# Patient Record
Sex: Female | Born: 1985 | Race: Black or African American | Hispanic: No | Marital: Single | Smoking: Current every day smoker
Health system: Southern US, Community
[De-identification: ages and names within clinical notes are randomized; demographics above are authoritative.]

## PROBLEM LIST (undated history)

## (undated) ENCOUNTER — Inpatient Hospital Stay (HOSPITAL_COMMUNITY): Payer: Self-pay

## (undated) DIAGNOSIS — L309 Dermatitis, unspecified: Secondary | ICD-10-CM

## (undated) DIAGNOSIS — A630 Anogenital (venereal) warts: Secondary | ICD-10-CM

## (undated) DIAGNOSIS — R000 Tachycardia, unspecified: Secondary | ICD-10-CM

## (undated) DIAGNOSIS — N390 Urinary tract infection, site not specified: Secondary | ICD-10-CM

## (undated) DIAGNOSIS — IMO0002 Reserved for concepts with insufficient information to code with codable children: Secondary | ICD-10-CM

## (undated) DIAGNOSIS — F419 Anxiety disorder, unspecified: Secondary | ICD-10-CM

## (undated) DIAGNOSIS — A609 Anogenital herpesviral infection, unspecified: Secondary | ICD-10-CM

## (undated) DIAGNOSIS — F329 Major depressive disorder, single episode, unspecified: Secondary | ICD-10-CM

## (undated) DIAGNOSIS — F32A Depression, unspecified: Secondary | ICD-10-CM

## (undated) HISTORY — PX: WISDOM TOOTH EXTRACTION: SHX21

---
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
## 2003-12-15 ENCOUNTER — Emergency Department (HOSPITAL_COMMUNITY): Admission: EM | Admit: 2003-12-15 | Discharge: 2003-12-15 | Payer: Self-pay | Admitting: Emergency Medicine

## 2004-09-11 ENCOUNTER — Ambulatory Visit: Payer: Self-pay | Admitting: Nurse Practitioner

## 2004-11-05 ENCOUNTER — Emergency Department (HOSPITAL_COMMUNITY): Admission: EM | Admit: 2004-11-05 | Discharge: 2004-11-05 | Payer: Self-pay | Admitting: Emergency Medicine

## 2004-12-05 ENCOUNTER — Ambulatory Visit: Payer: Self-pay | Admitting: Nurse Practitioner

## 2005-02-04 ENCOUNTER — Inpatient Hospital Stay (HOSPITAL_COMMUNITY): Admission: AD | Admit: 2005-02-04 | Discharge: 2005-02-05 | Payer: Self-pay | Admitting: *Deleted

## 2005-02-26 ENCOUNTER — Ambulatory Visit (HOSPITAL_COMMUNITY): Admission: RE | Admit: 2005-02-26 | Discharge: 2005-02-26 | Payer: Self-pay | Admitting: *Deleted

## 2005-04-13 ENCOUNTER — Inpatient Hospital Stay (HOSPITAL_COMMUNITY): Admission: AD | Admit: 2005-04-13 | Discharge: 2005-04-13 | Payer: Self-pay | Admitting: *Deleted

## 2005-04-13 ENCOUNTER — Ambulatory Visit: Payer: Self-pay | Admitting: Family Medicine

## 2005-04-30 ENCOUNTER — Inpatient Hospital Stay (HOSPITAL_COMMUNITY): Admission: AD | Admit: 2005-04-30 | Discharge: 2005-04-30 | Payer: Self-pay | Admitting: Obstetrics and Gynecology

## 2005-04-30 ENCOUNTER — Ambulatory Visit: Payer: Self-pay | Admitting: Family Medicine

## 2005-07-01 ENCOUNTER — Ambulatory Visit: Payer: Self-pay | Admitting: *Deleted

## 2005-07-01 ENCOUNTER — Inpatient Hospital Stay (HOSPITAL_COMMUNITY): Admission: AD | Admit: 2005-07-01 | Discharge: 2005-07-03 | Payer: Self-pay | Admitting: Obstetrics and Gynecology

## 2005-10-23 ENCOUNTER — Emergency Department (HOSPITAL_COMMUNITY): Admission: EM | Admit: 2005-10-23 | Discharge: 2005-10-23 | Payer: Self-pay | Admitting: Emergency Medicine

## 2005-11-30 ENCOUNTER — Emergency Department (HOSPITAL_COMMUNITY): Admission: EM | Admit: 2005-11-30 | Discharge: 2005-11-30 | Payer: Self-pay | Admitting: Emergency Medicine

## 2006-07-16 ENCOUNTER — Emergency Department (HOSPITAL_COMMUNITY): Admission: EM | Admit: 2006-07-16 | Discharge: 2006-07-16 | Payer: Self-pay | Admitting: Emergency Medicine

## 2006-07-19 ENCOUNTER — Emergency Department (HOSPITAL_COMMUNITY): Admission: EM | Admit: 2006-07-19 | Discharge: 2006-07-19 | Payer: Self-pay | Admitting: Emergency Medicine

## 2006-07-28 ENCOUNTER — Encounter: Admission: RE | Admit: 2006-07-28 | Discharge: 2006-09-15 | Payer: Self-pay | Admitting: Geriatric Medicine

## 2007-01-03 ENCOUNTER — Emergency Department (HOSPITAL_COMMUNITY): Admission: EM | Admit: 2007-01-03 | Discharge: 2007-01-03 | Payer: Self-pay | Admitting: Family Medicine

## 2007-01-30 ENCOUNTER — Emergency Department (HOSPITAL_COMMUNITY): Admission: EM | Admit: 2007-01-30 | Discharge: 2007-01-30 | Payer: Self-pay | Admitting: Family Medicine

## 2007-04-02 ENCOUNTER — Emergency Department (HOSPITAL_COMMUNITY): Admission: EM | Admit: 2007-04-02 | Discharge: 2007-04-02 | Payer: Self-pay | Admitting: Family Medicine

## 2007-10-28 ENCOUNTER — Emergency Department (HOSPITAL_COMMUNITY): Admission: EM | Admit: 2007-10-28 | Discharge: 2007-10-28 | Payer: Self-pay | Admitting: Family Medicine

## 2008-01-11 ENCOUNTER — Emergency Department (HOSPITAL_COMMUNITY): Admission: EM | Admit: 2008-01-11 | Discharge: 2008-01-11 | Payer: Self-pay | Admitting: Emergency Medicine

## 2008-05-06 ENCOUNTER — Emergency Department (HOSPITAL_COMMUNITY): Admission: EM | Admit: 2008-05-06 | Discharge: 2008-05-06 | Payer: Self-pay | Admitting: Emergency Medicine

## 2008-08-17 ENCOUNTER — Emergency Department (HOSPITAL_COMMUNITY): Admission: EM | Admit: 2008-08-17 | Discharge: 2008-08-17 | Payer: Self-pay | Admitting: Emergency Medicine

## 2008-11-01 ENCOUNTER — Emergency Department (HOSPITAL_COMMUNITY): Admission: EM | Admit: 2008-11-01 | Discharge: 2008-11-01 | Payer: Self-pay | Admitting: Family Medicine

## 2008-11-04 ENCOUNTER — Emergency Department (HOSPITAL_COMMUNITY): Admission: EM | Admit: 2008-11-04 | Discharge: 2008-11-04 | Payer: Self-pay | Admitting: Family Medicine

## 2009-06-27 ENCOUNTER — Emergency Department (HOSPITAL_COMMUNITY): Admission: EM | Admit: 2009-06-27 | Discharge: 2009-06-27 | Payer: Self-pay | Admitting: Emergency Medicine

## 2009-07-17 ENCOUNTER — Emergency Department (HOSPITAL_COMMUNITY): Admission: EM | Admit: 2009-07-17 | Discharge: 2009-07-17 | Payer: Self-pay | Admitting: Family Medicine

## 2009-10-10 ENCOUNTER — Emergency Department (HOSPITAL_COMMUNITY)
Admission: EM | Admit: 2009-10-10 | Discharge: 2009-10-10 | Payer: Self-pay | Source: Home / Self Care | Admitting: Family Medicine

## 2009-12-15 ENCOUNTER — Emergency Department (HOSPITAL_COMMUNITY)
Admission: EM | Admit: 2009-12-15 | Discharge: 2009-12-15 | Payer: Self-pay | Source: Home / Self Care | Admitting: Emergency Medicine

## 2010-02-08 ENCOUNTER — Emergency Department (HOSPITAL_COMMUNITY)
Admission: EM | Admit: 2010-02-08 | Discharge: 2010-02-08 | Payer: Self-pay | Source: Home / Self Care | Admitting: Family Medicine

## 2010-02-08 LAB — WET PREP, GENITAL
Trich, Wet Prep: NONE SEEN
Yeast Wet Prep HPF POC: NONE SEEN

## 2010-02-08 LAB — POCT URINALYSIS DIPSTICK
Hemoglobin, Urine: NEGATIVE
Ketones, ur: NEGATIVE mg/dL
Nitrite: NEGATIVE
Protein, ur: 30 mg/dL — AB
Specific Gravity, Urine: >=1.03 (ref 1.005–1.030)
Urine Glucose, Fasting: 100 mg/dL — AB
Urobilinogen, UA: 0.2 mg/dL (ref 0.0–1.0)
pH: 5.5 (ref 5.0–8.0)

## 2010-02-08 LAB — POCT PREGNANCY, URINE: Preg Test, Ur: NEGATIVE

## 2010-02-18 LAB — GC/CHLAMYDIA PROBE AMP, GENITAL
Chlamydia, DNA Probe: NEGATIVE
GC Probe Amp, Genital: NEGATIVE

## 2010-03-13 ENCOUNTER — Inpatient Hospital Stay (INDEPENDENT_AMBULATORY_CARE_PROVIDER_SITE_OTHER)
Admission: RE | Admit: 2010-03-13 | Discharge: 2010-03-13 | Disposition: A | Discharge status: Home / Self Care | Payer: Medicaid Other | Source: Ambulatory Visit | Attending: Emergency Medicine | Admitting: Emergency Medicine

## 2010-03-13 DIAGNOSIS — R070 Pain in throat: Secondary | ICD-10-CM

## 2010-03-13 LAB — POCT RAPID STREP A (OFFICE): Streptococcus, Group A Screen (Direct): NEGATIVE

## 2010-04-16 ENCOUNTER — Inpatient Hospital Stay (INDEPENDENT_AMBULATORY_CARE_PROVIDER_SITE_OTHER)
Admission: RE | Admit: 2010-04-16 | Discharge: 2010-04-16 | Disposition: A | Discharge status: Home / Self Care | Payer: Medicaid Other | Source: Ambulatory Visit | Attending: Family Medicine | Admitting: Family Medicine

## 2010-04-16 DIAGNOSIS — N760 Acute vaginitis: Secondary | ICD-10-CM

## 2010-04-16 LAB — WET PREP, GENITAL
Trich, Wet Prep: NONE SEEN
Yeast Wet Prep HPF POC: NONE SEEN

## 2010-04-16 LAB — POCT URINALYSIS DIPSTICK
Glucose, UA: NEGATIVE mg/dL
Hgb urine dipstick: NEGATIVE
Ketones, ur: NEGATIVE mg/dL
Nitrite: NEGATIVE
Protein, ur: 30 mg/dL — AB
Specific Gravity, Urine: >=1.03 (ref 1.005–1.030)
Urobilinogen, UA: 0.2 mg/dL (ref 0.0–1.0)
pH: 5.5 (ref 5.0–8.0)

## 2010-04-16 LAB — POCT PREGNANCY, URINE: Preg Test, Ur: NEGATIVE

## 2010-04-17 LAB — GC/CHLAMYDIA PROBE AMP, GENITAL
Chlamydia, DNA Probe: NEGATIVE
GC Probe Amp, Genital: NEGATIVE

## 2010-04-18 LAB — POCT URINALYSIS DIPSTICK
Bilirubin Urine: NEGATIVE
Glucose, UA: NEGATIVE mg/dL
Hgb urine dipstick: NEGATIVE
Ketones, ur: NEGATIVE mg/dL
Nitrite: NEGATIVE
Protein, ur: NEGATIVE mg/dL
Specific Gravity, Urine: 1.02 (ref 1.005–1.030)
Urobilinogen, UA: 1 mg/dL (ref 0.0–1.0)
pH: 7.5 (ref 5.0–8.0)

## 2010-04-18 LAB — WET PREP, GENITAL
Trich, Wet Prep: NONE SEEN
Yeast Wet Prep HPF POC: NONE SEEN

## 2010-04-18 LAB — GC/CHLAMYDIA PROBE AMP, GENITAL
Chlamydia, DNA Probe: NEGATIVE
GC Probe Amp, Genital: NEGATIVE

## 2010-04-18 LAB — POCT PREGNANCY, URINE: Preg Test, Ur: NEGATIVE

## 2010-04-22 LAB — POCT URINALYSIS DIP (DEVICE)
Bilirubin Urine: NEGATIVE
Glucose, UA: NEGATIVE mg/dL
Hgb urine dipstick: NEGATIVE
Ketones, ur: NEGATIVE mg/dL
Nitrite: NEGATIVE
Protein, ur: 30 mg/dL — AB
Specific Gravity, Urine: >=1.03 (ref 1.005–1.030)
Urobilinogen, UA: 0.2 mg/dL (ref 0.0–1.0)
pH: 5.5 (ref 5.0–8.0)

## 2010-04-22 LAB — WET PREP, GENITAL

## 2010-04-22 LAB — GC/CHLAMYDIA PROBE AMP, GENITAL
Chlamydia, DNA Probe: NEGATIVE
GC Probe Amp, Genital: NEGATIVE

## 2010-05-09 LAB — WET PREP, GENITAL

## 2010-05-10 LAB — POCT URINALYSIS DIP (DEVICE)
Glucose, UA: NEGATIVE mg/dL
Hgb urine dipstick: NEGATIVE
Ketones, ur: NEGATIVE mg/dL
Specific Gravity, Urine: 1.025 (ref 1.005–1.030)
Urobilinogen, UA: 0.2 mg/dL (ref 0.0–1.0)

## 2010-05-10 LAB — WET PREP, GENITAL: Yeast Wet Prep HPF POC: NONE SEEN

## 2010-05-12 LAB — WET PREP, GENITAL
Clue Cells Wet Prep HPF POC: NONE SEEN
Trich, Wet Prep: NONE SEEN
Yeast Wet Prep HPF POC: NONE SEEN

## 2010-05-12 LAB — POCT URINALYSIS DIP (DEVICE)
Bilirubin Urine: NEGATIVE
Glucose, UA: NEGATIVE mg/dL
Hgb urine dipstick: NEGATIVE
Ketones, ur: NEGATIVE mg/dL
Specific Gravity, Urine: 1.02 (ref 1.005–1.030)
Urobilinogen, UA: 0.2 mg/dL (ref 0.0–1.0)

## 2010-05-12 LAB — GC/CHLAMYDIA PROBE AMP, GENITAL: Chlamydia, DNA Probe: NEGATIVE

## 2010-06-21 NOTE — Discharge Summary (Signed)
[facility]  Patient:    Alicia Pearson, Alicia Pearson Visit Number: [id number] MRN: [id number]          Service Type: OBS Location: 910A 9133 01 Attending Physician:  Tammi Sou Dictated by:   Emelda Fear, M.D. Admit Date:  02/28/2001 Discharge Date: 03/03/2001                             Discharge Summary  DATE OF BIRTH:  [birth year]  CONSULTATIONS:  None.  PROCEDURES:  Vacuum assisted vaginal delivery of twin B.  DISCHARGE DIAGNOSES: 1. Intrauterine pregnancy at 38-5/[redacted] weeks gestation. 2. Augmentation of labor via low dose Pitocin therapy. 3. Delivery of twin gestation of two viable infants. 4. Preeclampsia, status post magnesium sulfate therapy.  DISCHARGE MEDICATIONS: 1. Ibuprofen 600 mg one tablet p.o. q.6h. p.r.n. pain. 2. Prenatal vitamins one tablet p.o. q.d. x6 weeks. 3. Depo-Provera, the patient received injection prior to discharge, and will    need next injection in the beginning of May 2003.  HOSPITAL COURSE:  [name] is a 25 year old, G2, P1-0-0-1, presenting at 38-5/7 weeks with twin gestation, dychorionic, admitted for onset of labor. The patient received low dose Pitocin therapy for augmentation of labor. Artificial rupture of membranes of twin A.  Bag with clear fluid return.  The patient taken to the operating room for vaginal deliveries of twins.  Delivery of twin A via a normal spontaneous vaginal delivery.  Delivery of a female infant with Apgars of 9 at one minute and 9 at five minutes.  Following delivery of twin A, artificial rupture of membranes of twin B sac with clear fluid return.  Arm of twin B was presenting part.  Arm of twin B was reduced by Dr. Gavin Potters, and had flexed to facilitate the cephalic presentation and descent of infant.  Delivery via vacuum assisted vaginal delivery of twin B, secondary to persistent variables with fetal heart rate in the 80s.  Apgars of twin B were 9 at one minute  and 9 at five minutes.  Of note, upon presentation the patient with an elevated uric acid level of 6.7, and protein present in her urine.  The patient was initiated on magnesium sulfate therapy immediately postpartum for preeclampsia.  On postpartum day #1, the patients uric acid had increased to 7.2.  The patient was continued on magnesium sulfate, remaining asymptomatic.  Magnesium sulfate was stopped right on postpartum day #1, and the patient was observed for a subsequent 24 hours.  The patient was discharged stable with normal blood pressures and asymptomatic on postpartum day #3.  Infants will return home with mother.  LABORATORY DATA:  Hemoglobin 11.6, hematocrit 34.6.  ACTIVITY:  Unrestricted.  DIET:  Routine.  DISCHARGE INSTRUCTIONS:  Routine postpartum instructions.  DISPOSITION:  Discharged to home.  FOLLOWUP:  The patient is to follow up at [facility] in six weeks. Dictated by:   Emelda Fear, M.D. Attending Physician:  Tammi Sou DD:  03/03/01 TD:  03/03/01 Job: [id number]

## 2010-06-21 NOTE — Discharge Summary (Signed)
[facility]  Patient:    Alicia Pearson, Alicia Pearson Visit Number: [id number] MRN: [id number]          Service Type: OBS Location: 910D 9124 01 Attending Physician:  Antionette Char Dictated by:   Nicoletta Ba, M.D. Admit Date:  12/21/2000 Discharge Date: 12/28/2000                             Discharge Summary  ADMISSION DIAGNOSES:          1. Twenty-nine-week intrauterine twin gestation.                               2. Preterm labor.  DISCHARGE DIAGNOSES:          1. Twenty-nine-week intrauterine twin gestation.                               2. Preterm labor.  CONSULTS:                     1. Neonatology on December 22, 2000.  PROCEDURES:                   1. Complete OB ultrasound: Impressions were a twin gestation with Twin A in cephalic presentation with a posterior placenta without previa.  Estimated fetal weight of 33rd percentile.  Cervix 0.9 cm. Twin B: Cephalic presentation with a lateral placenta without previa. Estimated fetal weight 51st percentile.  The fluid for both infants was subjectively normal.  HISTORY AND PHYSICAL:         For complete H&P, please see residents H&P in chart.  Briefly, this is a 25 year old G2, P1-0-0-1, who presented at [redacted] weeks gestation to high risk clinic appointment.  At that time, she admitted to feeling "balling up" in the lower abdomen, but did not know this was contraction.  She denies any crampy pain.  Cervix check at that time was 3 cm dilated, 80% effaced with a bulging bag of water and vertex presentation. Both fetuses had reassuring fetal heart tracings and the tocometry showed only uterine irritability.  She was admitted to antenatal for tocolysis, antibiotics, steroids and observation.  HOSPITAL COURSE:            Problem 1: Preterm labor.  Patient was given betamethasone 12 mg IM x2, 24 hours apart.  She was begun on magnesium IV at 2 g/hr after an initial 4 g bolus.  She continued to  have several uterine contractions per hour and the initial magnesium infusion rate was increased to 3 g/hr.  Soon her contractions were subsiding and further cervical checks did not reveal any further dilation or effacement.  The fetuses remained with reassuring fetal heart tracings.  After stabilization on magnesium, she was switched to p.o. terbutaline.  Cervix check at that time showed a 3 cm external os, 2 cm internal os, was very thick and high.  Soon after being started on p.o. terbutaline, the patient had a brief bout of viral gastroenteritis after being exposed to a family member with this illness. She, during that period of time of nausea and vomiting and diarrhea, had some increased uterine activity and her terbutaline was increased to 2.5 mg every 4 hours.  Her uterine contractions then subsided.  Cervix check on the day of discharge showed her to be  a loose 2 cm, approximately 50% effaced and -3 station, cephalic presentation.  Due to her stability on p.o. terbutaline and lack of change in cervix, she was seen ready for discharge to home on p.o. terbutaline and bedrest.  Follow up was to be with her usual high risk clinic appointment on the Thursday of the week following discharge.  DISCHARGE MEDICATIONS:        1. Terbutaline 2.5 mg p.o. q.4h.                               2. Prenatal vitamin. Dictated by:   Nicoletta Ba, M.D. Attending Physician:  Antionette Char DD:  12/28/00 TD:  12/28/00 Job: [id number]

## 2010-09-02 ENCOUNTER — Inpatient Hospital Stay (INDEPENDENT_AMBULATORY_CARE_PROVIDER_SITE_OTHER)
Admission: RE | Admit: 2010-09-02 | Discharge: 2010-09-02 | Disposition: A | Discharge status: Home / Self Care | Payer: Medicaid Other | Source: Ambulatory Visit | Attending: Family Medicine | Admitting: Family Medicine

## 2010-09-02 DIAGNOSIS — K1120 Sialoadenitis, unspecified: Secondary | ICD-10-CM

## 2010-09-02 DIAGNOSIS — N760 Acute vaginitis: Secondary | ICD-10-CM

## 2010-09-03 LAB — GC/CHLAMYDIA PROBE AMP, GENITAL: Chlamydia, DNA Probe: NEGATIVE

## 2010-10-25 LAB — POCT URINALYSIS DIP (DEVICE)
Bilirubin Urine: NEGATIVE
Glucose, UA: NEGATIVE
Nitrite: NEGATIVE
Operator id: 116391
Urobilinogen, UA: 0.2

## 2010-10-25 LAB — WET PREP, GENITAL

## 2010-10-25 LAB — POCT PREGNANCY, URINE
Operator id: 116391
Preg Test, Ur: NEGATIVE

## 2010-10-25 LAB — GC/CHLAMYDIA PROBE AMP, GENITAL: Chlamydia, DNA Probe: NEGATIVE

## 2010-11-04 LAB — GC/CHLAMYDIA PROBE AMP, GENITAL: Chlamydia, DNA Probe: NEGATIVE

## 2010-11-04 LAB — WET PREP, GENITAL
Trich, Wet Prep: NONE SEEN
Yeast Wet Prep HPF POC: NONE SEEN

## 2010-11-08 LAB — POCT URINALYSIS DIP (DEVICE)
Bilirubin Urine: NEGATIVE
Glucose, UA: NEGATIVE mg/dL
Hgb urine dipstick: NEGATIVE
Nitrite: NEGATIVE
Protein, ur: 30 mg/dL — AB
Specific Gravity, Urine: 1.02 (ref 1.005–1.030)
Urobilinogen, UA: 1 mg/dL (ref 0.0–1.0)
pH: 7 (ref 5.0–8.0)

## 2010-11-08 LAB — WET PREP, GENITAL
Trich, Wet Prep: NONE SEEN
Yeast Wet Prep HPF POC: NONE SEEN

## 2010-11-08 LAB — GC/CHLAMYDIA PROBE AMP, GENITAL: GC Probe Amp, Genital: NEGATIVE

## 2010-11-08 LAB — POCT RAPID STREP A: Streptococcus, Group A Screen (Direct): POSITIVE — AB

## 2010-11-08 LAB — POCT PREGNANCY, URINE: Preg Test, Ur: NEGATIVE

## 2010-11-12 LAB — POCT URINALYSIS DIP (DEVICE)
Nitrite: NEGATIVE
Operator id: 235561
Protein, ur: 30 — AB
Specific Gravity, Urine: >=1.03
Urobilinogen, UA: 0.2

## 2010-11-12 LAB — POCT PREGNANCY, URINE
Operator id: 235561
Preg Test, Ur: NEGATIVE

## 2010-11-12 LAB — URINE CULTURE

## 2011-09-14 ENCOUNTER — Emergency Department (HOSPITAL_COMMUNITY)
Admission: EM | Admit: 2011-09-14 | Discharge: 2011-09-14 | Discharge status: Against Medical Advice | Payer: Medicaid Other

## 2011-09-22 ENCOUNTER — Encounter: Payer: Self-pay | Admitting: Gynecology

## 2011-09-22 ENCOUNTER — Ambulatory Visit (INDEPENDENT_AMBULATORY_CARE_PROVIDER_SITE_OTHER): Payer: Medicaid Other | Admitting: Gynecology

## 2011-09-22 VITALS — BP 93/57 | Wt 188.0 lb

## 2011-09-22 DIAGNOSIS — N912 Amenorrhea, unspecified: Secondary | ICD-10-CM

## 2011-09-22 DIAGNOSIS — Z3480 Encounter for supervision of other normal pregnancy, unspecified trimester: Secondary | ICD-10-CM

## 2011-09-22 LAB — POCT URINE PREGNANCY: Preg Test, Ur: POSITIVE

## 2011-09-22 MED ORDER — PRENATAL VITAMINS 0.8 MG PO TABS: 1.0000 | ORAL_TABLET | Freq: Every day | ORAL | Status: DC

## 2011-09-22 NOTE — Addendum Note (Signed)
Addended by: Vinnie Langton C on: 09/22/2011 12:02 PM   Modules accepted: Orders

## 2011-09-23 LAB — OBSTETRIC PANEL
Antibody Screen: NEGATIVE
Basophils Relative: 0 % (ref 0–1)
HCT: 39.6 % (ref 36.0–46.0)
Hemoglobin: 13.7 g/dL (ref 12.0–15.0)
Lymphocytes Relative: 21 % (ref 12–46)
MCHC: 34.6 g/dL (ref 30.0–36.0)
MCV: 92.5 fL (ref 78.0–100.0)
Monocytes Absolute: 0.7 10*3/uL (ref 0.1–1.0)
Monocytes Relative: 7 % (ref 3–12)
Neutro Abs: 7.1 10*3/uL (ref 1.7–7.7)
Rh Type: POSITIVE
Rubella: 60 IU/mL — ABNORMAL HIGH

## 2011-09-25 ENCOUNTER — Other Ambulatory Visit (HOSPITAL_COMMUNITY)
Admission: RE | Admit: 2011-09-25 | Discharge: 2011-09-25 | Disposition: A | Discharge status: Home / Self Care | Payer: Medicaid Other | Source: Ambulatory Visit | Attending: Obstetrics & Gynecology | Admitting: Obstetrics & Gynecology

## 2011-09-25 ENCOUNTER — Ambulatory Visit (INDEPENDENT_AMBULATORY_CARE_PROVIDER_SITE_OTHER): Payer: Medicaid Other | Admitting: Obstetrics & Gynecology

## 2011-09-25 VITALS — BP 93/62 | Wt 187.0 lb

## 2011-09-25 DIAGNOSIS — Z113 Encounter for screening for infections with a predominantly sexual mode of transmission: Secondary | ICD-10-CM | POA: Insufficient documentation

## 2011-09-25 DIAGNOSIS — Z3480 Encounter for supervision of other normal pregnancy, unspecified trimester: Secondary | ICD-10-CM

## 2011-09-25 DIAGNOSIS — Z3490 Encounter for supervision of normal pregnancy, unspecified, unspecified trimester: Secondary | ICD-10-CM

## 2011-09-25 DIAGNOSIS — Z01419 Encounter for gynecological examination (general) (routine) without abnormal findings: Secondary | ICD-10-CM | POA: Insufficient documentation

## 2011-09-25 NOTE — Patient Instructions (Signed)
Pregnancy - First Trimester  During sexual intercourse, millions of sperm go into the vagina. Only 1 sperm will penetrate and fertilize the female egg while it is in the Fallopian tube. One week later, the fertilized egg implants into the wall of the uterus. An embryo begins to develop into a baby. At 6 to 8 weeks, the eyes and face are formed and the heartbeat can be seen on ultrasound. At the end of 12 weeks (first trimester), all the baby's organs are formed. Now that you are pregnant, you will want to do everything you can to have a healthy baby. Two of the most important things are to get good prenatal care and follow your caregiver's instructions. Prenatal care is all the medical care you receive before the baby's birth. It is given to prevent, find, and treat problems during the pregnancy and childbirth.  PRENATAL EXAMS   During prenatal visits, your weight, blood pressure and urine are checked. This is done to make sure you are healthy and progressing normally during the pregnancy.   A pregnant woman should gain 25 to 35 pounds during the pregnancy. However, if you are over weight or underweight, your caregiver will advise you regarding your weight.   Your caregiver will ask and answer questions for you.   Blood work, cervical cultures, other necessary tests and a Pap test are done during your prenatal exams. These tests are done to check on your health and the probable health of your baby. Tests are strongly recommended and done for HIV with your permission. This is the virus that causes AIDS. These tests are done because medications can be given to help prevent your baby from being born with this infection should you have been infected without knowing it. Blood work is also used to find out your blood type, previous infections and follow your blood levels (hemoglobin).   Low hemoglobin (anemia) is common during pregnancy. Iron and vitamins are given to help prevent this. Later in the pregnancy, blood  tests for diabetes will be done along with any other tests if any problems develop. You may need tests to make sure you and the baby are doing well.   You may need other tests to make sure you and the baby are doing well.  CHANGES DURING THE FIRST TRIMESTER (THE FIRST 3 MONTHS OF PREGNANCY)  Your body goes through many changes during pregnancy. They vary from person to person. Talk to your caregiver about changes you notice and are concerned about. Changes can include:   Your menstrual period stops.   The egg and sperm carry the genes that determine what you look like. Genes from you and your partner are forming a baby. The female genes determine whether the baby is a boy or a girl.   Your body increases in girth and you may feel bloated.   Feeling sick to your stomach (nauseous) and throwing up (vomiting). If the vomiting is uncontrollable, call your caregiver.   Your breasts will begin to enlarge and become tender.   Your nipples may stick out more and become darker.   The need to urinate more. Painful urination may mean you have a bladder infection.   Tiring easily.   Loss of appetite.   Cravings for certain kinds of food.   At first, you may gain or lose a couple of pounds.   You may have changes in your emotions from day to day (excited to be pregnant or concerned something may go wrong with   the pregnancy and baby).   You may have more vivid and strange dreams.  HOME CARE INSTRUCTIONS    It is very important to avoid all smoking, alcohol and un-prescribed drugs during your pregnancy. These affect the formation and growth of the baby. Avoid chemicals while pregnant to ensure the delivery of a healthy infant.   Start your prenatal visits by the 12th week of pregnancy. They are usually scheduled monthly at first, then more often in the last 2 months before delivery. Keep your caregiver's appointments. Follow your caregiver's instructions regarding medication use, blood and lab tests, exercise, and  diet.   During pregnancy, you are providing food for you and your baby. Eat regular, well-balanced meals. Choose foods such as meat, fish, milk and other low fat dairy products, vegetables, fruits, and whole-grain breads and cereals. Your caregiver will tell you of the ideal weight gain.   You can help morning sickness by keeping soda crackers at the bedside. Eat a couple before arising in the morning. You may want to use the crackers without salt on them.   Eating 4 to 5 small meals rather than 3 large meals a day also may help the nausea and vomiting.   Drinking liquids between meals instead of during meals also seems to help nausea and vomiting.   A physical sexual relationship may be continued throughout pregnancy if there are no other problems. Problems may be early (premature) leaking of amniotic fluid from the membranes, vaginal bleeding, or belly (abdominal) pain.   Exercise regularly if there are no restrictions. Check with your caregiver or physical therapist if you are unsure of the safety of some of your exercises. Greater weight gain will occur in the last 2 trimesters of pregnancy. Exercising will help:   Control your weight.   Keep you in shape.   Prepare you for labor and delivery.   Help you lose your pregnancy weight after you deliver your baby.   Wear a good support or jogging bra for breast tenderness during pregnancy. This may help if worn during sleep too.   Ask when prenatal classes are available. Begin classes when they are offered.   Do not use hot tubs, steam rooms or saunas.   Wear your seat belt when driving. This protects you and your baby if you are in an accident.   Avoid raw meat, uncooked cheese, cat litter boxes and soil used by cats throughout the pregnancy. These carry germs that can cause birth defects in the baby.   The first trimester is a good time to visit your dentist for your dental health. Getting your teeth cleaned is OK. Use a softer toothbrush and brush  gently during pregnancy.   Ask for help if you have financial, counseling or nutritional needs during pregnancy. Your caregiver will be able to offer counseling for these needs as well as refer you for other special needs.   Do not take any medications or herbs unless told by your caregiver.   Inform your caregiver if there is any mental or physical domestic violence.   Make a list of emergency phone numbers of family, friends, hospital, and police and fire departments.   Write down your questions. Take them to your prenatal visit.   Do not douche.   Do not cross your legs.   If you have to stand for long periods of time, rotate you feet or take small steps in a circle.   You may have more vaginal secretions that may   require a sanitary pad. Do not use tampons or scented sanitary pads.  MEDICATIONS AND DRUG USE IN PREGNANCY   Take prenatal vitamins as directed. The vitamin should contain 1 milligram of folic acid. Keep all vitamins out of reach of children. Only a couple vitamins or tablets containing iron may be fatal to a baby or young child when ingested.   Avoid use of all medications, including herbs, over-the-counter medications, not prescribed or suggested by your caregiver. Only take over-the-counter or prescription medicines for pain, discomfort, or fever as directed by your caregiver. Do not use aspirin, ibuprofen, or naproxen unless directed by your caregiver.   Let your caregiver also know about herbs you may be using.   Alcohol is related to a number of birth defects. This includes fetal alcohol syndrome. All alcohol, in any form, should be avoided completely. Smoking will cause low birth rate and premature babies.   Street or illegal drugs are very harmful to the baby. They are absolutely forbidden. A baby born to an addicted mother will be addicted at birth. The baby will go through the same withdrawal an adult does.   Let your caregiver know about any medications that you have to take  and for what reason you take them.  MISCARRIAGE IS COMMON DURING PREGNANCY  A miscarriage does not mean you did something wrong. It is not a reason to worry about getting pregnant again. Your caregiver will help you with questions you may have. If you have a miscarriage, you may need minor surgery.  SEEK MEDICAL CARE IF:   You have any concerns or worries during your pregnancy. It is better to call with your questions if you feel they cannot wait, rather than worry about them.  SEEK IMMEDIATE MEDICAL CARE IF:    An unexplained oral temperature above 102 F (38.9 C) develops, or as your caregiver suggests.   You have leaking of fluid from the vagina (birth canal). If leaking membranes are suspected, take your temperature and inform your caregiver of this when you call.   There is vaginal spotting or bleeding. Notify your caregiver of the amount and how many pads are used.   You develop a bad smelling vaginal discharge with a change in the color.   You continue to feel sick to your stomach (nauseated) and have no relief from remedies suggested. You vomit blood or coffee ground-like materials.   You lose more than 2 pounds of weight in 1 week.   You gain more than 2 pounds of weight in 1 week and you notice swelling of your face, hands, feet, or legs.   You gain 5 pounds or more in 1 week (even if you do not have swelling of your hands, face, legs, or feet).   You get exposed to German measles and have never had them.   You are exposed to fifth disease or chickenpox.   You develop belly (abdominal) pain. Round ligament discomfort is a common non-cancerous (benign) cause of abdominal pain in pregnancy. Your caregiver still must evaluate this.   You develop headache, fever, diarrhea, pain with urination, or shortness of breath.   You fall or are in a car accident or have any kind of trauma.   There is mental or physical violence in your home.  Document Released: 01/14/2001 Document Revised: 01/09/2011  Document Reviewed: 07/18/2008  [employer] Patient Information 2012 [employer].

## 2011-09-25 NOTE — Progress Notes (Signed)
   Subjective:    Alicia Pearson is a [id number] [redacted]w[redacted]d being seen today for her first obstetrical visit.  Her obstetrical history is significant for smoker.  Pregnancy history fully reviewed.  Patient reports no complaints.  Filed Vitals:   09/25/11 0900  BP: 93/62  Weight: 187 lb (84.823 kg)    HISTORY: OB History    Grav Para Term Preterm Abortions TAB SAB Ect Mult Living   4 3 3      1 4      # Outc Date GA Lbr Len/2nd Wgt Sex Del Anes PTL Lv   1 TRM 9/00 [redacted]w[redacted]d   F SVD EPI  Yes   2A TRM 1/03 [redacted]w[redacted]d   M SVD EPI  Yes   2B  1/03 [redacted]w[redacted]d   M SVD EPI  Yes   3 TRM 5/07 [redacted]w[redacted]d   M SVD EPI No Yes   4 CUR              No past medical history on file. Past Surgical History  Procedure Date  . Wisdom tooth extraction    Family History  Problem Relation Age of Onset  . Cancer Mother     breast 2003  . Hypertension Mother   . Aneurysm Mother     x2  . Alcohol abuse Father   . Alcohol abuse Brother   . Alzheimer's disease Maternal Grandmother      Exam    Uterus:     Pelvic Exam:    Perineum: No Hemorrhoids, Normal Perineum   Vulva: normal   Vagina:  normal mucosa, brown discharge   Cervix: normal   Adnexa: normal adnexa and no mass, fullness, tenderness   Bony Pelvis: average  System: Breast:  normal appearance, no masses or tenderness   Skin: normal coloration and turgor, no rashes    Neurologic: normal   Extremities: normal strength, tone, and muscle mass   HEENT PERRLA   Mouth/Teeth mucous membranes moist, pharynx normal without lesions   Neck supple and no masses   Cardiovascular: regular rate and rhythm   Respiratory:  appears well, vitals normal, no respiratory distress, acyanotic, normal RR   Abdomen: soft, non-tender; bowel sounds normal; no masses,  no organomegaly   Urinary: urethral meatus normal  Unable to adequately see fetal heart beast on bedside ultrasound, patient will be sent for viability scan.    Assessment:    Pregnancy: [id number] Patient  Active Problem List  Diagnosis  . Supervision of normal pregnancy      Plan:     Initial labs normal. Continue prenatal vitamins; advised smoking cessation Problem list reviewed and updated. Genetic Screening discussed First Screen: ordered.  Ultrasound discussed; fetal survey: will be ordered later.  Follow up in 4 weeks.

## 2011-10-01 ENCOUNTER — Ambulatory Visit (HOSPITAL_COMMUNITY)
Admission: RE | Admit: 2011-10-01 | Discharge: 2011-10-01 | Disposition: A | Discharge status: Home / Self Care | Payer: Medicaid Other | Source: Ambulatory Visit | Attending: Obstetrics & Gynecology | Admitting: Obstetrics & Gynecology

## 2011-10-01 DIAGNOSIS — Z3490 Encounter for supervision of normal pregnancy, unspecified, unspecified trimester: Secondary | ICD-10-CM

## 2011-10-01 DIAGNOSIS — O3680X0 Pregnancy with inconclusive fetal viability, not applicable or unspecified: Secondary | ICD-10-CM | POA: Insufficient documentation

## 2011-10-23 ENCOUNTER — Other Ambulatory Visit: Payer: Self-pay | Admitting: Obstetrics & Gynecology

## 2011-10-23 ENCOUNTER — Ambulatory Visit (INDEPENDENT_AMBULATORY_CARE_PROVIDER_SITE_OTHER): Payer: Medicaid Other | Admitting: Obstetrics & Gynecology

## 2011-10-23 ENCOUNTER — Encounter: Payer: Self-pay | Admitting: Obstetrics & Gynecology

## 2011-10-23 ENCOUNTER — Encounter (HOSPITAL_COMMUNITY): Payer: Self-pay | Admitting: Obstetrics & Gynecology

## 2011-10-23 VITALS — BP 95/64 | Wt 183.0 lb

## 2011-10-23 DIAGNOSIS — Z3682 Encounter for antenatal screening for nuchal translucency: Secondary | ICD-10-CM

## 2011-10-23 DIAGNOSIS — Z3490 Encounter for supervision of normal pregnancy, unspecified, unspecified trimester: Secondary | ICD-10-CM

## 2011-10-23 DIAGNOSIS — Z3480 Encounter for supervision of other normal pregnancy, unspecified trimester: Secondary | ICD-10-CM

## 2011-10-23 MED ORDER — PRENATAL MULTIVITAMIN CH: 1.0000 | ORAL_TABLET | Freq: Every day | ORAL | Status: DC

## 2011-10-23 NOTE — Progress Notes (Signed)
Routine visit. No problems. She wants First screen. I will make sure that it is scheduled. She declines flu shot. We discussed smoking cessation.

## 2011-10-23 NOTE — Addendum Note (Signed)
Addended by: Barbara Cower on: 10/23/2011 10:06 AM   Modules accepted: Orders

## 2011-10-29 ENCOUNTER — Encounter (HOSPITAL_COMMUNITY): Payer: Self-pay

## 2011-10-29 ENCOUNTER — Ambulatory Visit (HOSPITAL_COMMUNITY)
Admission: RE | Admit: 2011-10-29 | Discharge: 2011-10-29 | Disposition: A | Discharge status: Home / Self Care | Payer: Medicaid Other | Source: Ambulatory Visit | Attending: Obstetrics & Gynecology | Admitting: Obstetrics & Gynecology

## 2011-10-29 VITALS — BP 105/65 | HR 86 | Wt 183.8 lb

## 2011-10-29 DIAGNOSIS — O3510X0 Maternal care for (suspected) chromosomal abnormality in fetus, unspecified, not applicable or unspecified: Secondary | ICD-10-CM | POA: Insufficient documentation

## 2011-10-29 DIAGNOSIS — O351XX0 Maternal care for (suspected) chromosomal abnormality in fetus, not applicable or unspecified: Secondary | ICD-10-CM | POA: Insufficient documentation

## 2011-10-29 DIAGNOSIS — Z3682 Encounter for antenatal screening for nuchal translucency: Secondary | ICD-10-CM

## 2011-10-29 DIAGNOSIS — Z3490 Encounter for supervision of normal pregnancy, unspecified, unspecified trimester: Secondary | ICD-10-CM

## 2011-10-29 DIAGNOSIS — Z3689 Encounter for other specified antenatal screening: Secondary | ICD-10-CM | POA: Insufficient documentation

## 2011-10-29 DIAGNOSIS — O99330 Smoking (tobacco) complicating pregnancy, unspecified trimester: Secondary | ICD-10-CM | POA: Insufficient documentation

## 2011-10-29 NOTE — Progress Notes (Signed)
Genetic Counseling  High-Risk Gestation Note  Appointment Date:  10/29/2011 Referred By: Alicia Bossier, MD Date of Birth:  [birth year] Partner: Gilles Chiquito   Pregnancy History: [id number] Estimated Date of Delivery: 05/02/12 Estimated Gestational Age: [redacted]w[redacted]d  I met with [name] for genetic counseling because of an abnormal ultrasound finding.  [name] son and her sister attended the genetic counseling appointment today.  By ultrasound today, the fetal nuchal translucency (NT) measured 3.6 mm.  We discussed that the fetal NT refers to a fluid filled space between the skin and soft tissues behind the cervical spine.  This space is traditionally measurable between 11 and 13.[redacted] weeks gestation and is considered enlarged at >95 percentile for gestational age.  She was counseled regarding the various common etiologies for an enlarged NT including: aneuploidy, single gene conditions, cardiac or great vessel abnormalities, lymphatic system failure, decreased fetal movement, and fetal anemia.    We reviewed chromosomes, nondisjunction, and the common features and prognoses of Down syndrome and other aneuploidies.  Considering AliciaPearson's age of 67, an NT of 3.6 and a CRL of 72.9 mm, the risk for fetal aneuploidy is estimated to be ~1 in 65 (1.5%).   In addition, we discussed that an NT of 3.6 is associated with an approximate 3% risk for a fetal cardiac anomaly.  We also discussed single gene conditions and that these conditions are not routinely tested for prenatally unless ultrasound findings or family history significantly increase the suspicion of a specific single gene disorder.  We briefly reviewed common inheritance patterns (dominant, recessive, and X-linked) as well as the associated risks of recurrence.     She was then counseled regarding her screening options including: First trimester screening, noninvasive prenatal testing (NIPT), 2nd trimester detailed ultrasound, and  fetal echocardiogram.  She understands that screening options provide a pregnancy specific risk for fetal aneuploidy, but are not diagnostic.  In addition, we reviewed the availability of diagnostic testing via [facility] and amniocentesis.   We reviewed the benefits, limitations, and risks of these options.  After thoughtful consideration, she elected to proceed with NIPT.  We discussed that NIPT analyzes cell-free fetal DNA found in the maternal circulation. This test is not diagnostic for chromosome conditions, but can provide information regarding the presence or absence of extra fetal DNA for chromosomes 13, 18, 21, X, and Y, and missing fetal DNA for chromosome X (Turner syndrome) and Y.  Thus, it would not identify or rule out all genetic conditions. The reported detection rate is greater than 99% for Trisomy 21, greater than 98% for Trisomy 18, and is approximately 80% (8 out of 10) for Trisomy 13. The false positive rate is reported to be less than 0.1% for any of these conditions.    She also expressed interest in a detailed ultrasound at ~18 weeks and fetal echocardiogram.  She was counseled that the fetal prognosis depends on the underlying etiology of the enlarged NT and further anticipatory guidance can be provided if a diagnosis is discovered.  She understands that the legal limit for TAB in [place] is [redacted] weeks gestation.  Both family histories were reviewed and found to be noncontributory for birth defects, mental retardation, and known genetic conditions. Without further information regarding the provided family history, an accurate genetic risk cannot be calculated. Further genetic counseling is warranted if more information is obtained.  Alicia Pearson was provided with written information regarding sickle cell anemia (SCA) including the carrier frequency and incidence in  the African-American population, the availability of carrier testing and prenatal diagnosis if indicated.  In addition, we discussed  that hemoglobinopathies are routinely screened for as part of the [place] newborn screening panel.  She would like to discuss the availability of this testing during her next OB visit.  Alicia Pearson denied exposure to environmental toxins or chemical agents. She denied the use of alcohol, tobacco or street drugs. She denied significant viral illnesses during the course of her pregnancy.  I counseled Alicia Pearson regarding the above risks and available options.  The approximate face-to-face time with the genetic counselor was 38 minutes.  Despina Arias, MS Certified Genetic Counselor

## 2011-11-03 ENCOUNTER — Telehealth (HOSPITAL_COMMUNITY): Payer: Self-pay | Admitting: Obstetrics & Gynecology

## 2011-11-03 ENCOUNTER — Telehealth (HOSPITAL_COMMUNITY): Payer: Self-pay | Admitting: MS"

## 2011-11-03 NOTE — Telephone Encounter (Signed)
Patient called [facility] and spoke with Myrene Buddy in reception. Per Ms. Jean Rosenthal, patient and partner considered further, and they requested that they not be contacted with results of lab work that was performed at her recent visit to this office.

## 2011-11-10 ENCOUNTER — Telehealth (HOSPITAL_COMMUNITY): Payer: Self-pay

## 2011-11-10 ENCOUNTER — Encounter (HOSPITAL_COMMUNITY): Payer: Self-pay

## 2011-11-10 NOTE — Telephone Encounter (Signed)
I called Norton Blizzard to follow up on her recent phone call to the office.  Ms. Mcgrory was initially seen for genetic counseling regarding the finding of an increased nuchal translucency.  Following a discussion of available options, Ms. Liberman elected to have NIPT (Harmony).  She called on 11/03/11 stating that she wished that she had not had this testing and did not wish to receive the results.  I called her today to discuss her request.  She stated that she felt like knowing this information would cause her anxiety. She stated that she had read that "these tests have high false positive rates."  We reviewed that Cathlean Sauer has a FPR of 0.1% for trisomies 13, 18, and 21.  We also reviewed the detection rate (>99%) for trisomy 21.  I told her that her result is now reported out, and available should she change her mind.  She asked that the result not be forwarded to her primary care provider, because of the concern that she might "accidentally" be informed of the results. She requested that her result not be scanned into her electronic MR, again out of fear that she would receive the result accidentally.  I will review this request with my department at our division meeting this afternoon.  I will contact the patient if her request is not possible.  We then reviewed the availability of a detailed ultrasound (at [redacted] weeks gestation) and fetal echocardiogram for further evaluation of the fetal anatomy and heart.  I explained that information from these evaluations may increase or decrease the suspicion of fetal aneuploidy.  She understands and wishes to have these tests.  All questions were answered to her satisfaction, she was encouraged to call with additional questions or concerns.  Despina Arias, MS Certified Genetic Counselor

## 2011-11-11 ENCOUNTER — Encounter: Payer: Self-pay | Admitting: Obstetrics & Gynecology

## 2011-11-12 ENCOUNTER — Inpatient Hospital Stay (HOSPITAL_COMMUNITY)
Admission: AD | Admit: 2011-11-12 | Discharge: 2011-11-13 | Disposition: A | Discharge status: Home / Self Care | Payer: Medicaid Other | Source: Ambulatory Visit | Attending: Obstetrics & Gynecology | Admitting: Obstetrics & Gynecology

## 2011-11-12 ENCOUNTER — Other Ambulatory Visit: Payer: Self-pay | Admitting: Obstetrics & Gynecology

## 2011-11-12 ENCOUNTER — Encounter (HOSPITAL_COMMUNITY): Payer: Self-pay | Admitting: *Deleted

## 2011-11-12 ENCOUNTER — Telehealth (HOSPITAL_COMMUNITY): Payer: Self-pay | Admitting: *Deleted

## 2011-11-12 DIAGNOSIS — Z3480 Encounter for supervision of other normal pregnancy, unspecified trimester: Secondary | ICD-10-CM

## 2011-11-12 DIAGNOSIS — O99891 Other specified diseases and conditions complicating pregnancy: Secondary | ICD-10-CM | POA: Insufficient documentation

## 2011-11-12 DIAGNOSIS — Z3490 Encounter for supervision of normal pregnancy, unspecified, unspecified trimester: Secondary | ICD-10-CM

## 2011-11-12 DIAGNOSIS — O26899 Other specified pregnancy related conditions, unspecified trimester: Secondary | ICD-10-CM

## 2011-11-12 DIAGNOSIS — R109 Unspecified abdominal pain: Secondary | ICD-10-CM | POA: Insufficient documentation

## 2011-11-12 NOTE — MAU Note (Signed)
Pt reports she has been having "contractions twice a day" for the last two weeks. denies bleeding or discharge

## 2011-11-12 NOTE — Telephone Encounter (Signed)
Shirell called her OB office today requesting the results of her harmony test.  The office called and requested a copy of this result to be released to her.  The last communication between the patient and GC was that the results were not to be released for fear she would accidentally be told the result.  I personally called to patient to be sure that she wanted the result released to her OB office.  She states that she doesn't want to know but her fiance wants to know.  I explained to the patient that our GC's were out of the office until Monday, but would be glad to call and discuss the results with her or her fiance when they return, or we can scan the results into epic and the OB can release those results to her.  She wishes for the results to be entered into epic and to be called with the results tomorrow.

## 2011-11-13 LAB — WET PREP, GENITAL: Yeast Wet Prep HPF POC: NONE SEEN

## 2011-11-13 LAB — URINALYSIS, ROUTINE W REFLEX MICROSCOPIC
Specific Gravity, Urine: >1.03 — ABNORMAL HIGH (ref 1.005–1.030)
Urobilinogen, UA: 0.2 mg/dL (ref 0.0–1.0)
pH: 5.5 (ref 5.0–8.0)

## 2011-11-13 NOTE — MAU Provider Note (Signed)
History     CSN: [id number]  Arrival date and time: 11/12/11 2254   First Provider Initiated Contact with Patient 11/13/11 0019      Chief Complaint  Patient presents with  . Contractions   HPI Alicia Pearson is 26 y.o. [phone] [redacted]w[redacted]d weeks presenting with report of lower abdominal contractions 2Xday for 2 weeks.  States it lasts 20 secs that is bearable.  She called the office and told to come in to have her cervix checked.  She is a patient at [facility].  Denies vaginal bleeding.  Has Creamy yellowish discharge.   Denies hx of preterm deliveries.    History reviewed. No pertinent past medical history.  Past Surgical History  Procedure Date  . Wisdom tooth extraction     Family History  Problem Relation Age of Onset  . Cancer Mother     breast 2003  . Hypertension Mother   . Aneurysm Mother     x2  . Alcohol abuse Father   . Alcohol abuse Brother   . Alzheimer's disease Maternal Grandmother     History  Substance Use Topics  . Smoking status: Never Smoker   . Smokeless tobacco: Not on file  . Alcohol Use: No    Allergies: No Known Allergies  Prescriptions prior to admission  Medication Sig Dispense Refill  . Prenatal Vit-Fe Fumarate-FA (PRENATAL MULTIVITAMIN) TABS Take 1 tablet by mouth daily.  30 tablet  11    Review of Systems  Constitutional: Negative.   HENT: Negative.   Respiratory: Negative.   Cardiovascular: Negative.   Gastrointestinal: Positive for abdominal pain ("contractions 2xday" for 2 weeks).  Genitourinary: Negative for dysuria, urgency and frequency.       + for vaginal discharge Neg for vaginal bleeding   Physical Exam   Blood pressure 111/65, pulse 86, temperature 98.3 F (36.8 C), temperature source Oral, resp. rate 20, height 5\' 4"  (1.626 m), weight 83.915 kg (185 lb), last menstrual period 08/02/2011, SpO2 100.00%.  Physical Exam  Constitutional: She is oriented to person, place, and time. She appears well-developed and  well-nourished. No distress.  HENT:  Head: Normocephalic.  Neck: Normal range of motion.  Cardiovascular: Normal rate.   Respiratory: Effort normal.  GI: Soft. She exhibits no distension and no mass. There is no tenderness. There is no rebound and no guarding.  Genitourinary: Uterus is enlarged. Uterus is not tender. Cervix exhibits discharge. Cervix exhibits no motion tenderness and no friability. No bleeding around the vagina. Vaginal discharge (moderate amount of frothy discharge that is yellowish in color) found.       Cervix is closed, thick  Neurological: She is alert and oriented to person, place, and time.  Skin: Skin is warm and dry.  Psychiatric: She has a normal mood and affect. Her behavior is normal.   FETAL HEART RATE 150  Results for orders placed during the hospital encounter of 11/12/11 (from the past 24 hour(s))  URINALYSIS, ROUTINE W REFLEX MICROSCOPIC     Status: Abnormal   Collection Time   11/12/11 11:27 PM      Component Value Range   Color, Urine YELLOW  YELLOW   APPearance CLEAR  CLEAR   Specific Gravity, Urine >1.030 (*) 1.005 - 1.030   pH 5.5  5.0 - 8.0   Glucose, UA NEGATIVE  NEGATIVE mg/dL   Hgb urine dipstick NEGATIVE  NEGATIVE   Bilirubin Urine NEGATIVE  NEGATIVE   Ketones, ur 15 (*) NEGATIVE mg/dL   Protein,  ur NEGATIVE  NEGATIVE mg/dL   Urobilinogen, UA 0.2  0.0 - 1.0 mg/dL   Nitrite NEGATIVE  NEGATIVE   Leukocytes, UA NEGATIVE  NEGATIVE  WET PREP, GENITAL     Status: Abnormal   Collection Time   11/13/11 12:40 AM      Component Value Range   Yeast Wet Prep HPF POC NONE SEEN  NONE SEEN   Trich, Wet Prep NONE SEEN  NONE SEEN   Clue Cells Wet Prep HPF POC NONE SEEN  NONE SEEN   WBC, Wet Prep HPF POC MODERATE BACTERIA SEEN (*) NONE SEEN   MAU Course  Procedures  MDM   Assessment and Plan  A:  Abdominal pain at [redacted]w[redacted]d gestation     Viable intrauterine pregnancy  P:Keep scheduled appointment with your doctor    Call the office for  worsening sxs  KEY,EVE M 11/13/2011, 1:14 AM

## 2011-11-17 ENCOUNTER — Encounter: Payer: Self-pay | Admitting: Obstetrics & Gynecology

## 2011-11-20 ENCOUNTER — Ambulatory Visit (INDEPENDENT_AMBULATORY_CARE_PROVIDER_SITE_OTHER): Payer: Medicaid Other | Admitting: Obstetrics & Gynecology

## 2011-11-20 ENCOUNTER — Other Ambulatory Visit: Payer: Self-pay | Admitting: Obstetrics & Gynecology

## 2011-11-20 VITALS — BP 98/69 | Wt 183.0 lb

## 2011-11-20 DIAGNOSIS — Z3480 Encounter for supervision of other normal pregnancy, unspecified trimester: Secondary | ICD-10-CM

## 2011-11-20 DIAGNOSIS — R772 Abnormality of alphafetoprotein: Secondary | ICD-10-CM

## 2011-11-20 DIAGNOSIS — Z3689 Encounter for other specified antenatal screening: Secondary | ICD-10-CM

## 2011-11-20 NOTE — Progress Notes (Signed)
Patient does not wish to know results of harmony test.

## 2011-11-20 NOTE — Progress Notes (Signed)
Pt seen in the MAU for abdominal pain that she is referring to as 'Deberah Pelton' ctx.  Exam neg.  Reviewed with pt BH ctx and timing of them in pregnancy.  Pt with abnormal Harmony results.  Pts partner had been advised of the results and pt initially reported that she did not want to know the results.  Today, after the completion of the visit, pt returned with requesst to discuss the results of the Harmony test.  Pt notified of the increased risk of Down's syndrome.  Pt offered amniocentesis which she declined. D/W pt the risks of certain cardiac defects with Down's she will e scheduled for an anatomy scan and fetal echo if needed with MFM.

## 2011-11-20 NOTE — Patient Instructions (Signed)
Pregnancy - Second Trimester The second trimester of pregnancy (3 to 6 months) is a period of rapid growth for you and your baby. At the end of the sixth month, your baby is about 9 inches long and weighs 1 1/2 pounds. You will begin to feel the baby move between 18 and 20 weeks of the pregnancy. This is called quickening. Weight gain is faster. A clear fluid (colostrum) may leak out of your breasts. You may feel small contractions of the womb (uterus). This is known as false labor or Braxton-Hicks contractions. This is like a practice for labor when the baby is ready to be born. Usually, the problems with morning sickness have usually passed by the end of your first trimester. Some women develop small dark blotches (called cholasma, mask of pregnancy) on their face that usually goes away after the baby is born. Exposure to the sun makes the blotches worse. Acne may also develop in some pregnant women and pregnant women who have acne, may find that it goes away. PRENATAL EXAMS  Blood work may continue to be done during prenatal exams. These tests are done to check on your health and the probable health of your baby. Blood work is used to follow your blood levels (hemoglobin). Anemia (low hemoglobin) is common during pregnancy. Iron and vitamins are given to help prevent this. You will also be checked for diabetes between 24 and 28 weeks of the pregnancy. Some of the previous blood tests may be repeated.  The size of the uterus is measured during each visit. This is to make sure that the baby is continuing to grow properly according to the dates of the pregnancy.  Your blood pressure is checked every prenatal visit. This is to make sure you are not getting toxemia.  Your urine is checked to make sure you do not have an infection, diabetes or protein in the urine.  Your weight is checked often to make sure gains are happening at the suggested rate. This is to ensure that both you and your baby are growing  normally.  Sometimes, an ultrasound is performed to confirm the proper growth and development of the baby. This is a test which bounces harmless sound waves off the baby so your caregiver can more accurately determine due dates. Sometimes, a specialized test is done on the amniotic fluid surrounding the baby. This test is called an amniocentesis. The amniotic fluid is obtained by sticking a needle into the belly (abdomen). This is done to check the chromosomes in instances where there is a concern about possible genetic problems with the baby. It is also sometimes done near the end of pregnancy if an early delivery is required. In this case, it is done to help make sure the baby's lungs are mature enough for the baby to live outside of the womb. CHANGES OCCURING IN THE SECOND TRIMESTER OF PREGNANCY Your body goes through many changes during pregnancy. They vary from person to person. Talk to your caregiver about changes you notice that you are concerned about.  During the second trimester, you will likely have an increase in your appetite. It is normal to have cravings for certain foods. This varies from person to person and pregnancy to pregnancy.  Your lower abdomen will begin to bulge.  You may have to urinate more often because the uterus and baby are pressing on your bladder. It is also common to get more bladder infections during pregnancy (pain with urination). You can help this by   drinking lots of fluids and emptying your bladder before and after intercourse.  You may begin to get stretch marks on your hips, abdomen, and breasts. These are normal changes in the body during pregnancy. There are no exercises or medications to take that prevent this change.  You may begin to develop swollen and bulging veins (varicose veins) in your legs. Wearing support hose, elevating your feet for 15 minutes, 3 to 4 times a day and limiting salt in your diet helps lessen the problem.  Heartburn may develop  as the uterus grows and pushes up against the stomach. Antacids recommended by your caregiver helps with this problem. Also, eating smaller meals 4 to 5 times a day helps.  Constipation can be treated with a stool softener or adding bulk to your diet. Drinking lots of fluids, vegetables, fruits, and whole grains are helpful.  Exercising is also helpful. If you have been very active up until your pregnancy, most of these activities can be continued during your pregnancy. If you have been less active, it is helpful to start an exercise program such as walking.  Hemorrhoids (varicose veins in the rectum) may develop at the end of the second trimester. Warm sitz baths and hemorrhoid cream recommended by your caregiver helps hemorrhoid problems.  Backaches may develop during this time of your pregnancy. Avoid heavy lifting, wear low heal shoes and practice good posture to help with backache problems.  Some pregnant women develop tingling and numbness of their hand and fingers because of swelling and tightening of ligaments in the wrist (carpel tunnel syndrome). This goes away after the baby is born.  As your breasts enlarge, you may have to get a bigger bra. Get a comfortable, cotton, support bra. Do not get a nursing bra until the last month of the pregnancy if you will be nursing the baby.  You may get a dark line from your belly button to the pubic area called the linea nigra.  You may develop rosy cheeks because of increase blood flow to the face.  You may develop spider looking lines of the face, neck, arms and chest. These go away after the baby is born. HOME CARE INSTRUCTIONS   It is extremely important to avoid all smoking, herbs, alcohol, and unprescribed drugs during your pregnancy. These chemicals affect the formation and growth of the baby. Avoid these chemicals throughout the pregnancy to ensure the delivery of a healthy infant.  Most of your home care instructions are the same as  suggested for the first trimester of your pregnancy. Keep your caregiver's appointments. Follow your caregiver's instructions regarding medication use, exercise and diet.  During pregnancy, you are providing food for you and your baby. Continue to eat regular, well-balanced meals. Choose foods such as meat, fish, milk and other low fat dairy products, vegetables, fruits, and whole-grain breads and cereals. Your caregiver will tell you of the ideal weight gain.  A physical sexual relationship may be continued up until near the end of pregnancy if there are no other problems. Problems could include early (premature) leaking of amniotic fluid from the membranes, vaginal bleeding, abdominal pain, or other medical or pregnancy problems.  Exercise regularly if there are no restrictions. Check with your caregiver if you are unsure of the safety of some of your exercises. The greatest weight gain will occur in the last 2 trimesters of pregnancy. Exercise will help you:  Control your weight.  Get you in shape for labor and delivery.  Lose weight   after you have the baby.  Wear a good support or jogging bra for breast tenderness during pregnancy. This may help if worn during sleep. Pads or tissues may be used in the bra if you are leaking colostrum.  Do not use hot tubs, steam rooms or saunas throughout the pregnancy.  Wear your seat belt at all times when driving. This protects you and your baby if you are in an accident.  Avoid raw meat, uncooked cheese, cat litter boxes and soil used by cats. These carry germs that can cause birth defects in the baby.  The second trimester is also a good time to visit your dentist for your dental health if this has not been done yet. Getting your teeth cleaned is OK. Use a soft toothbrush. Brush gently during pregnancy.  It is easier to loose urine during pregnancy. Tightening up and strengthening the pelvic muscles will help with this problem. Practice stopping your  urination while you are going to the bathroom. These are the same muscles you need to strengthen. It is also the muscles you would use as if you were trying to stop from passing gas. You can practice tightening these muscles up 10 times a set and repeating this about 3 times per day. Once you know what muscles to tighten up, do not perform these exercises during urination. It is more likely to contribute to an infection by backing up the urine.  Ask for help if you have financial, counseling or nutritional needs during pregnancy. Your caregiver will be able to offer counseling for these needs as well as refer you for other special needs.  Your skin may become oily. If so, wash your face with mild soap, use non-greasy moisturizer and oil or cream based makeup. MEDICATIONS AND DRUG USE IN PREGNANCY  Take prenatal vitamins as directed. The vitamin should contain 1 milligram of folic acid. Keep all vitamins out of reach of children. Only a couple vitamins or tablets containing iron may be fatal to a baby or young child when ingested.  Avoid use of all medications, including herbs, over-the-counter medications, not prescribed or suggested by your caregiver. Only take over-the-counter or prescription medicines for pain, discomfort, or fever as directed by your caregiver. Do not use aspirin.  Let your caregiver also know about herbs you may be using.  Alcohol is related to a number of birth defects. This includes fetal alcohol syndrome. All alcohol, in any form, should be avoided completely. Smoking will cause low birth rate and premature babies.  Street or illegal drugs are very harmful to the baby. They are absolutely forbidden. A baby born to an addicted mother will be addicted at birth. The baby will go through the same withdrawal an adult does. SEEK MEDICAL CARE IF:  You have any concerns or worries during your pregnancy. It is better to call with your questions if you feel they cannot wait, rather  than worry about them. SEEK IMMEDIATE MEDICAL CARE IF:   An unexplained oral temperature above 102 F (38.9 C) develops, or as your caregiver suggests.  You have leaking of fluid from the vagina (birth canal). If leaking membranes are suspected, take your temperature and tell your caregiver of this when you call.  There is vaginal spotting, bleeding, or passing clots. Tell your caregiver of the amount and how many pads are used. Light spotting in pregnancy is common, especially following intercourse.  You develop a bad smelling vaginal discharge with a change in the color from clear   to white.  You continue to feel sick to your stomach (nauseated) and have no relief from remedies suggested. You vomit blood or coffee ground-like materials.  You lose more than 2 pounds of weight or gain more than 2 pounds of weight over 1 week, or as suggested by your caregiver.  You notice swelling of your face, hands, feet, or legs.  You get exposed to German measles and have never had them.  You are exposed to fifth disease or chickenpox.  You develop belly (abdominal) pain. Round ligament discomfort is a common non-cancerous (benign) cause of abdominal pain in pregnancy. Your caregiver still must evaluate you.  You develop a bad headache that does not go away.  You develop fever, diarrhea, pain with urination, or shortness of breath.  You develop visual problems, blurry, or double vision.  You fall or are in a car accident or any kind of trauma.  There is mental or physical violence at home. Document Released: 01/14/2001 Document Revised: 04/14/2011 Document Reviewed: 07/19/2008 Exit[employer] Patient Information 2013 [employer].  

## 2011-12-01 ENCOUNTER — Ambulatory Visit (HOSPITAL_COMMUNITY)
Admission: RE | Admit: 2011-12-01 | Discharge: 2011-12-01 | Disposition: A | Discharge status: Home / Self Care | Payer: Medicaid Other | Source: Ambulatory Visit | Attending: Obstetrics & Gynecology | Admitting: Obstetrics & Gynecology

## 2011-12-01 ENCOUNTER — Emergency Department (HOSPITAL_COMMUNITY)
Admission: EM | Admit: 2011-12-01 | Discharge: 2011-12-01 | Disposition: A | Discharge status: Home / Self Care | Payer: Medicaid Other | Attending: Emergency Medicine | Admitting: Emergency Medicine

## 2011-12-01 ENCOUNTER — Emergency Department (HOSPITAL_COMMUNITY): Payer: Medicaid Other

## 2011-12-01 VITALS — BP 115/66 | HR 110 | Wt 185.0 lb

## 2011-12-01 DIAGNOSIS — O9989 Other specified diseases and conditions complicating pregnancy, childbirth and the puerperium: Secondary | ICD-10-CM | POA: Insufficient documentation

## 2011-12-01 DIAGNOSIS — S6990XA Unspecified injury of unspecified wrist, hand and finger(s), initial encounter: Secondary | ICD-10-CM | POA: Insufficient documentation

## 2011-12-01 DIAGNOSIS — Z1389 Encounter for screening for other disorder: Secondary | ICD-10-CM | POA: Insufficient documentation

## 2011-12-01 DIAGNOSIS — Z3689 Encounter for other specified antenatal screening: Secondary | ICD-10-CM

## 2011-12-01 DIAGNOSIS — R772 Abnormality of alphafetoprotein: Secondary | ICD-10-CM

## 2011-12-01 DIAGNOSIS — IMO0001 Reserved for inherently not codable concepts without codable children: Secondary | ICD-10-CM

## 2011-12-01 DIAGNOSIS — S6980XA Other specified injuries of unspecified wrist, hand and finger(s), initial encounter: Secondary | ICD-10-CM | POA: Insufficient documentation

## 2011-12-01 DIAGNOSIS — O99330 Smoking (tobacco) complicating pregnancy, unspecified trimester: Secondary | ICD-10-CM | POA: Insufficient documentation

## 2011-12-01 DIAGNOSIS — O289 Unspecified abnormal findings on antenatal screening of mother: Secondary | ICD-10-CM | POA: Insufficient documentation

## 2011-12-01 DIAGNOSIS — O358XX0 Maternal care for other (suspected) fetal abnormality and damage, not applicable or unspecified: Secondary | ICD-10-CM | POA: Insufficient documentation

## 2011-12-01 DIAGNOSIS — Z363 Encounter for antenatal screening for malformations: Secondary | ICD-10-CM | POA: Insufficient documentation

## 2011-12-01 DIAGNOSIS — Z3490 Encounter for supervision of normal pregnancy, unspecified, unspecified trimester: Secondary | ICD-10-CM

## 2011-12-01 MED ORDER — OXYCODONE-ACETAMINOPHEN 5-325 MG PO TABS: 1.0000 | ORAL_TABLET | Freq: Four times a day (QID) | ORAL | Status: DC | PRN

## 2011-12-01 NOTE — ED Notes (Signed)
Pt Fetal Heart Tone 152

## 2011-12-01 NOTE — ED Notes (Signed)
Pt present to Ed with c/o right thumb injury.  No obvious deformities noted.  Pt reports her fiance bent her finger back when she attempted to grab his play station.  Pt reports hearing a"crunching noise"  Pt reports tingling and pain up her right arm.  Pt unable to bend thumb.

## 2011-12-01 NOTE — ED Provider Notes (Signed)
Medical screening examination/treatment/procedure(s) were performed by non-physician practitioner and as supervising physician I was immediately available for consultation/collaboration.   Brookelyn Gaynor, MD 12/01/11 2336 

## 2011-12-01 NOTE — Progress Notes (Signed)
Alicia Pearson had an ultrasound appointment today.  Please see [facility] report for details. .  Impression 1. Active SIUP at 18w 1d with known positive cell free fetal DNA test (Harmony) for Trisomy 21 on attempted comprehensive anatomic survey limited by early gestational age and fetal position. 2. Thickened nuchal fold (8mm) 3. Echogenic intracardiac focus 4. Abnormal fetal profile (hypoplastic nasal bone) 5. Images of the fetal heart, spine, diaphragm, and face are suboptimal and warrant follow up evaluation.  Discussion:  By way of consultation, I spoke to your patient for excess of 20 minutes about the implications of today's ultrasound findings and the Harmony screen that is positive for trisomy 21.  I explained to the patient that a multitude of genetic markers were found on today's images as follows:  Thickened nuchal fold, echogenic intracardiac focus (EIF), and an abnormal fetal profile (hypoplastic nasal bone).  Collectively, this constellation of markers further corroborates the Harmony test result and is highly concerning for a fetus with intrinsic disease, specifically that of Trisomy 21 (Down's syndrome).  I described and explained the spectrum of Down syndrome to her in plain terms.  We discussed that the genetic sonogram and Harmony test as indicating a high positive predictive value when combined but not a formal diagnostic test either separate or collectively.  I offered the patient an amniocentesis, citing to her the technical aspects of the procedure, associated risks, benefit of formal diagnosis with 99.4% sensitivity and specificity, and alternative of expectant management with testing postnatally.  I also discussed options of termination explaining to her state laws, 24 hour consent rule, and modes of termination with medical induction versus second trimester dilation and evacuation.  At this point, she indicated that she wished to decline amniocentesis and did not wish to  terminate.  She did desire a discussion with genetic counseling as an adjunct to my conversation and a report of this encounter will be made available separately.    All of your patient's questions were answered and addressed during today's encounter.  Although this was a lot to take in, Alicia Pearson demonstrated understanding of our discussion.   Recommendations 1. Fetal echocardiogram in 2 weeks; 2. Interval growth every 4 weeks of the pregnancy; 3. As a courtesy, such appointments were scheduled for your patient prior to her departure from our unit today.  Rogelia Boga, MD, MS, FACOG Assistant Professor Section of Maternal-Fetal Medicine [facility]

## 2011-12-01 NOTE — ED Notes (Signed)
Pt verbalizes understanding 
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
## 2011-12-02 DIAGNOSIS — O351XX0 Maternal care for (suspected) chromosomal abnormality in fetus, not applicable or unspecified: Secondary | ICD-10-CM | POA: Insufficient documentation

## 2011-12-02 NOTE — Progress Notes (Signed)
Genetic Counseling  High-Risk Gestation Note  Appointment Date:  12/01/2011 Referred By: Allie Bossier, MD Date of Birth:  1985/06/25    Pregnancy History: [id number] Estimated Date of Delivery: 05/02/12 Estimated Gestational Age: [redacted]w[redacted]d Attending: Damaris Hippo, MD  I met with Norton Blizzard for follow-up genetic counseling because of an increased risk for fetal Down syndrome based on NIPT (cell free fetal DNA) test results.   The results of the NIPT [facility]) revealed a greater than 99% chance of fetal Down syndrome. We discussed that this testing identifies >99% of pregnancies with Down syndrome with a false positive rate of 0.1%. We reviewed that this testing is highly sensitive and specific, but is not considered diagnostic. She was counseled that given the ultrasound findings from today's ultrasound (increased nuchal fold measurement, echogenic intracardiac focus, and hypoplastic nasal bone) and the positive cell free fetal DNA test result, the suspected diagnosis of Down syndrome is likely accurate. We did review that the cell free fetal DNA test can not distinguish between aneuploidy confined to the placenta, trisomy 21, translocation 21, or mosaic trisomy 21. We discussed the availability of amniocentesis or postnatal peripheral blood chromosome analysis for confirmation of the diagnosis. We reviewed the risks, benefits, and limitations of amniocentesis, including the approximate 1 in 300-500 risk for complications, including spontaneous pregnancy loss.    Considering the very high suspicion of Down syndrome, Ms. Kilner was counseled in detail regarding this diagnosis. We discussed that there are different types of Down syndrome, and each type is determined by the arrangement of the #21 chromosomes. Approximately 95% of cases of Down syndrome are trisomy 21 and 2-4% are due to a translocation involving chromosome 21. We reviewed chromosomes, nondisjunction, and that chromosome division  errors happen by chance and are not usually inherited. She understands that confirmatory testing will provide an actual karyotype and will help to determine accurate risks of recurrence for a future pregnancy.   We discussed that Down syndrome occurs once per every 800 births and is associated with specific features. However, there is a high degree of variability seen among children who have this condition, meaning that every child with Down syndrome will not be affected in exactly the same way, and some children will have more or less features than others. She was counseled that approximately half of individuals with Down syndrome have a cardiac anomaly and ~10-15% have an intestinal difference. Other anomalies of various organ systems have also been described in association with this diagnosis. Although the major anatomy appears structurally normal by ultrasound, a fetal echocardiogram is recommended for a detailed evaluation of the fetal heart.   We discussed that in general most individuals with Down syndrome have mild to moderate mental retardation and likely will require extra assistance with school work. Approximately 70-80% of children with Down syndrome have hypotonia which may lead to delays in sitting, walking, and talking. Hypotonia does tend to improve with age and early intervention services such as physical, occupational, and speech therapies can help with achievement of developmental milestones. We discussed that these therapies are typically provided to children (with qualifying diagnoses) from birth until age 28. We also discussed that Down syndrome is associated with characteristic facial features. Because of these facial features, many children with Down syndrome look similar to each other, but they were reminded that each child with Down syndrome is unique and will have many more features in common with his or her own family members.   We also reviewed that the  AAP have established health  supervision guidelines for individuals with Down syndrome. These guidelines provide medical management recommendations for various stages of life and can be a resource for families who have a child with Down syndrome as well as for health professionals. We also discussed that providing children with Down syndrome with a stimulating physical and social environment, as well as ensuring that the child receives adequate medical care and appropriate therapies, will help these children to reach their full potential. With the advances in medical technology, early intervention, and supportive therapies, many individuals with Down syndrome are able to live with an increasing degree of independence. Today, many adults with Down syndrome care for themselves, have jobs, and often times live in group homes or apartments where assistance is available if needed.   We discussed local and national support organizations and are available to connect this couple with another couple who were given a prenatal diagnosis of Down syndrome, if desired. Additionally, we discussed that postnatal health management can be coordinated by a medical geneticist as well as with a multidisciplinary team of physicians (Down syndrome clinic). This couple was encouraged to contact us if they have any additional questions/concerns or if they need additional supportive or informational recommendations.   We reviewed options for the pregnancy including continuing the pregnancy with expectant management, adoption, and termination of pregnancy. The patient is aware that the legal limit for TOP in [place] is [redacted] weeks gestation. The patient stated that she was undecided at this point regarding how to proceed with the pregnancy. She indicated that she and the father of the pregnancy are not necessarily in agreement on how to proceed with the pregnancy. She stated that she has considered termination of pregnancy but also stated that she would consider adoption,  particularly through an agency that allowed for an open adoption (continued contact with the adoptive family by the birth parents). Ms. Rickert also indicated that she is considering continuing the pregnancy and that the father of the pregnancy has expressed his desire to continue the pregnancy. We provided Ms. Norton Blizzard with written resources to assist in decision making including contact information and written information for National Down Syndrome Adoption Network; A Time to Decide, A Time to Heal; and Understanding a Down Syndrome Diagnosis, as well as a list of additional resources. We also offered to obtain information for Ms. Silveira regarding a counseling resource and will plan to contact the patient with available counselors in the area. After careful consideration, Ms. Brandenburger declined amniocentesis at this time. Additionally, fetal echocardiogram was scheduled for 12/22/11. Ms. Tison was encouraged to contact us with additional questions or concerns.   The majority of time (>50%) was spent on counseling with this couple. The approximate face-to-face time with the genetic counselor was 30 minutes.   Quinn Plowman, MS Certified Genetic Counselor  12/02/2011

## 2011-12-18 ENCOUNTER — Ambulatory Visit (INDEPENDENT_AMBULATORY_CARE_PROVIDER_SITE_OTHER): Payer: Medicaid Other | Admitting: Obstetrics & Gynecology

## 2011-12-18 VITALS — BP 116/71 | Wt 185.0 lb

## 2011-12-18 DIAGNOSIS — Z3490 Encounter for supervision of normal pregnancy, unspecified, unspecified trimester: Secondary | ICD-10-CM

## 2011-12-18 DIAGNOSIS — Z3480 Encounter for supervision of other normal pregnancy, unspecified trimester: Secondary | ICD-10-CM

## 2011-12-18 DIAGNOSIS — O351XX0 Maternal care for (suspected) chromosomal abnormality in fetus, not applicable or unspecified: Secondary | ICD-10-CM

## 2011-12-18 NOTE — Patient Instructions (Addendum)
Return to clinic for any obstetric concerns or go to [facility] for evaluation  

## 2011-12-18 NOTE — Progress Notes (Signed)
Fetal echo scheduled on 12/22/11, next growth scan 11/29 at [facility]. Still declines amniocentesis. No other complaints or concerns.  Fetal movement and labor precautions reviewed.

## 2011-12-22 ENCOUNTER — Other Ambulatory Visit (HOSPITAL_COMMUNITY): Payer: Self-pay | Admitting: Obstetrics and Gynecology

## 2011-12-22 ENCOUNTER — Ambulatory Visit (HOSPITAL_COMMUNITY)
Admission: RE | Admit: 2011-12-22 | Discharge: 2011-12-22 | Disposition: A | Discharge status: Home / Self Care | Payer: Medicaid Other | Source: Ambulatory Visit | Attending: Obstetrics & Gynecology | Admitting: Obstetrics & Gynecology

## 2011-12-22 ENCOUNTER — Telehealth (HOSPITAL_COMMUNITY): Payer: Self-pay | Admitting: MS"

## 2011-12-22 DIAGNOSIS — IMO0001 Reserved for inherently not codable concepts without codable children: Secondary | ICD-10-CM

## 2011-12-22 DIAGNOSIS — O351XX0 Maternal care for (suspected) chromosomal abnormality in fetus, not applicable or unspecified: Secondary | ICD-10-CM | POA: Insufficient documentation

## 2011-12-22 DIAGNOSIS — O3510X0 Maternal care for (suspected) chromosomal abnormality in fetus, unspecified, not applicable or unspecified: Secondary | ICD-10-CM | POA: Insufficient documentation

## 2011-12-22 DIAGNOSIS — Z3689 Encounter for other specified antenatal screening: Secondary | ICD-10-CM | POA: Insufficient documentation

## 2011-12-22 NOTE — Consult Note (Signed)
Dear  Colleagues,  I had the pleasure of seeing Alicia Pearson for initial consultation in the Fetal Heart Program at [facility]. As you know, she is a 26 y/o G4P4 (prior deliveries include twins, all 4 children healthy) currently at 21 weeks 2 days gestation referred for fetal cardiovascular evaluation secondary to the presence of Down Syndrome - based on results from [facility] testing.  This pregnancy was conceived naturally. No amniocentesis has been performed. She has not experienced any significant intercurrent illnesses or hospitalizations. She is on prenatal vitamins. She denies any exposure to tobacco, alcohol or recreational drugs. She denies any complaints today.  All systems reviewed and negative other than listed in HPI.  There is no family history of congenital heart disease, birth defects, genetic abnormalities, arrhythmia, pacemakers or sudden cardiac death.    A complete Fetal Echocardiogram with Doppler assessment was performed in our clinic today, which I personally interpreted and reviewed with Alicia Pearson and Alicia Pearson.  Please refer to a copy of my final report in [facility] if you would like to review further details.  In summary, fetal cardiovascular anatomy and function were normal with no intracardiac abnormalities identified.  Doppler patterns in the umbilical vessels and middle cerebral artery were normal. Fetal heart rate and rhythm were also normal with no ectopy or arrhythmia identified.  In summary, I felt that this Fetal Echo was completely normal. These findings were discussed with [facility].  I also told Alicia that fetal echocardiography may not be able to detect minor abnormalities such as small ventricular septal defects, subtle valve abnormalities or mild coarctation of the aorta. Nor can it predict postnatal persistence of normal fetal structures such as a patent foramen ovale or patent ductus arteriosus.  I do not feel that Mrs.  Pearson requires a follow-up Fetal Echo appointment in our [facility] Fetal Heart Clinic here at [facility].  But I am glad to see Alicia back prior to delivery if you have any persistent concerns or if new clinical problems arise. I also feel that a routine echocardiogram should be performed after delivery because of the presence of Down Syndrome.  We could do this prior to discharge from the nursery, but could do it sooner if indicated by clinical conditions. Please do not hesitate to contact me with any questions or concerns.  Thank you for involving me in the care of your patient today.   Sincerely,  Massie Bougie, MD Pediatric Cardiology [facility]  Total Time: 30 minues. Time of Counseling/Coordinating care: 20 minutes.

## 2011-12-22 NOTE — Telephone Encounter (Signed)
Called Alicia Pearson to follow-up regarding the current pregnancy. At her last genetic counseling visit, Ms. Chesterfield indicated that she was unsure of her plans for the pregnancy and was possibly considering adoption. Ms. Olheiser stated that she and her partner have decided to continue with pregnancy and keep the baby. The patient asked if there were available resources regarding appropriate physicians to care for the baby in case there is something wrong with her heart, for example. Reviewed that her fetal echocardiogram this afternoon is with [facility] Pediatric Cardiologist who will discuss results of the fetal echo with her today and appropriate treatment and management plan, if indicated. Ms. Saner was encouraged to contact us with additional questions or concerns at any point.   Clydie Braun Dustine Bertini 12/22/2011 9:31 AM

## 2012-01-02 ENCOUNTER — Ambulatory Visit (HOSPITAL_COMMUNITY)
Admission: RE | Admit: 2012-01-02 | Discharge: 2012-01-02 | Disposition: A | Discharge status: Home / Self Care | Payer: Medicaid Other | Source: Ambulatory Visit | Attending: Obstetrics & Gynecology | Admitting: Obstetrics & Gynecology

## 2012-01-02 VITALS — BP 130/72 | HR 92 | Wt 190.5 lb

## 2012-01-02 DIAGNOSIS — O358XX0 Maternal care for other (suspected) fetal abnormality and damage, not applicable or unspecified: Secondary | ICD-10-CM | POA: Insufficient documentation

## 2012-01-02 DIAGNOSIS — O3510X0 Maternal care for (suspected) chromosomal abnormality in fetus, unspecified, not applicable or unspecified: Secondary | ICD-10-CM | POA: Insufficient documentation

## 2012-01-02 DIAGNOSIS — IMO0001 Reserved for inherently not codable concepts without codable children: Secondary | ICD-10-CM

## 2012-01-02 DIAGNOSIS — O351XX0 Maternal care for (suspected) chromosomal abnormality in fetus, not applicable or unspecified: Secondary | ICD-10-CM | POA: Insufficient documentation

## 2012-01-02 DIAGNOSIS — Z3689 Encounter for other specified antenatal screening: Secondary | ICD-10-CM | POA: Insufficient documentation

## 2012-01-02 DIAGNOSIS — Z3490 Encounter for supervision of normal pregnancy, unspecified, unspecified trimester: Secondary | ICD-10-CM

## 2012-01-02 DIAGNOSIS — O99330 Smoking (tobacco) complicating pregnancy, unspecified trimester: Secondary | ICD-10-CM | POA: Insufficient documentation

## 2012-01-02 NOTE — Progress Notes (Signed)
Ms. Allshouse had an ultrasound appointment today.  Please see [facility] report for details. .  Impression 1. Active SIUP at 22w 5d with known positive cell free fetal DNA test ([facility]) for Trisomy 21 on follow up anatomic survey limited by early gestational age and fetal position. 2. Thickened nuchal fold  3. Echogenic intracardiac focus 4. Images of the spine remain suboptimal and warrant follow up evaluation. 5. EFW is at the 41st percentile for GA  Recommendations 1. Interval growth every 4 weeks of the pregnancy; 2. As a courtesy, such appointments were scheduled for your patient prior to her departure from our unit today.  Rogelia Boga, MD, MS, FACOG Assistant Professor Section of Maternal-Fetal Medicine [facility]

## 2012-01-07 ENCOUNTER — Other Ambulatory Visit: Payer: Self-pay | Admitting: Family Medicine

## 2012-01-07 DIAGNOSIS — O351XX0 Maternal care for (suspected) chromosomal abnormality in fetus, not applicable or unspecified: Secondary | ICD-10-CM

## 2012-01-15 ENCOUNTER — Encounter: Payer: Self-pay | Admitting: Family Medicine

## 2012-01-15 ENCOUNTER — Ambulatory Visit (INDEPENDENT_AMBULATORY_CARE_PROVIDER_SITE_OTHER): Payer: Medicaid Other | Admitting: Family Medicine

## 2012-01-15 VITALS — BP 108/69 | Wt 190.0 lb

## 2012-01-15 DIAGNOSIS — Z3480 Encounter for supervision of other normal pregnancy, unspecified trimester: Secondary | ICD-10-CM

## 2012-01-15 DIAGNOSIS — O351XX0 Maternal care for (suspected) chromosomal abnormality in fetus, not applicable or unspecified: Secondary | ICD-10-CM

## 2012-01-15 NOTE — Patient Instructions (Signed)
Pregnancy - Second Trimester The second trimester of pregnancy (3 to 6 months) is a period of rapid growth for you and your baby. At the end of the sixth month, your baby is about 9 inches long and weighs 1 1/2 pounds. You will begin to feel the baby move between 18 and 20 weeks of the pregnancy. This is called quickening. Weight gain is faster. A clear fluid (colostrum) may leak out of your breasts. You may feel small contractions of the womb (uterus). This is known as false labor or Braxton-Hicks contractions. This is like a practice for labor when the baby is ready to be born. Usually, the problems with morning sickness have usually passed by the end of your first trimester. Some women develop small dark blotches (called cholasma, mask of pregnancy) on their face that usually goes away after the baby is born. Exposure to the sun makes the blotches worse. Acne may also develop in some pregnant women and pregnant women who have acne, may find that it goes away. PRENATAL EXAMS  Blood work may continue to be done during prenatal exams. These tests are done to check on your health and the probable health of your baby. Blood work is used to follow your blood levels (hemoglobin). Anemia (low hemoglobin) is common during pregnancy. Iron and vitamins are given to help prevent this. You will also be checked for diabetes between 24 and 28 weeks of the pregnancy. Some of the previous blood tests may be repeated.  The size of the uterus is measured during each visit. This is to make sure that the baby is continuing to grow properly according to the dates of the pregnancy.  Your blood pressure is checked every prenatal visit. This is to make sure you are not getting toxemia.  Your urine is checked to make sure you do not have an infection, diabetes or protein in the urine.  Your weight is checked often to make sure gains are happening at the suggested rate. This is to ensure that both you and your baby are  growing normally.  Sometimes, an ultrasound is performed to confirm the proper growth and development of the baby. This is a test which bounces harmless sound waves off the baby so your caregiver can more accurately determine due dates. Sometimes, a specialized test is done on the amniotic fluid surrounding the baby. This test is called an amniocentesis. The amniotic fluid is obtained by sticking a needle into the belly (abdomen). This is done to check the chromosomes in instances where there is a concern about possible genetic problems with the baby. It is also sometimes done near the end of pregnancy if an early delivery is required. In this case, it is done to help make sure the baby's lungs are mature enough for the baby to live outside of the womb. CHANGES OCCURING IN THE SECOND TRIMESTER OF PREGNANCY Your body goes through many changes during pregnancy. They vary from person to person. Talk to your caregiver about changes you notice that you are concerned about.  During the second trimester, you will likely have an increase in your appetite. It is normal to have cravings for certain foods. This varies from person to person and pregnancy to pregnancy.  Your lower abdomen will begin to bulge.  You may have to urinate more often because the uterus and baby are pressing on your bladder. It is also common to get more bladder infections during pregnancy (pain with urination). You can help this by   drinking lots of fluids and emptying your bladder before and after intercourse.  You may begin to get stretch marks on your hips, abdomen, and breasts. These are normal changes in the body during pregnancy. There are no exercises or medications to take that prevent this change.  You may begin to develop swollen and bulging veins (varicose veins) in your legs. Wearing support hose, elevating your feet for 15 minutes, 3 to 4 times a day and limiting salt in your diet helps lessen the problem.  Heartburn may  develop as the uterus grows and pushes up against the stomach. Antacids recommended by your caregiver helps with this problem. Also, eating smaller meals 4 to 5 times a day helps.  Constipation can be treated with a stool softener or adding bulk to your diet. Drinking lots of fluids, vegetables, fruits, and whole grains are helpful.  Exercising is also helpful. If you have been very active up until your pregnancy, most of these activities can be continued during your pregnancy. If you have been less active, it is helpful to start an exercise program such as walking.  Hemorrhoids (varicose veins in the rectum) may develop at the end of the second trimester. Warm sitz baths and hemorrhoid cream recommended by your caregiver helps hemorrhoid problems.  Backaches may develop during this time of your pregnancy. Avoid heavy lifting, wear low heal shoes and practice good posture to help with backache problems.  Some pregnant women develop tingling and numbness of their hand and fingers because of swelling and tightening of ligaments in the wrist (carpel tunnel syndrome). This goes away after the baby is born.  As your breasts enlarge, you may have to get a bigger bra. Get a comfortable, cotton, support bra. Do not get a nursing bra until the last month of the pregnancy if you will be nursing the baby.  You may get a dark line from your belly button to the pubic area called the linea nigra.  You may develop rosy cheeks because of increase blood flow to the face.  You may develop spider looking lines of the face, neck, arms and chest. These go away after the baby is born. HOME CARE INSTRUCTIONS   It is extremely important to avoid all smoking, herbs, alcohol, and unprescribed drugs during your pregnancy. These chemicals affect the formation and growth of the baby. Avoid these chemicals throughout the pregnancy to ensure the delivery of a healthy infant.  Most of your home care instructions are the same  as suggested for the first trimester of your pregnancy. Keep your caregiver's appointments. Follow your caregiver's instructions regarding medication use, exercise and diet.  During pregnancy, you are providing food for you and your baby. Continue to eat regular, well-balanced meals. Choose foods such as meat, fish, milk and other low fat dairy products, vegetables, fruits, and whole-grain breads and cereals. Your caregiver will tell you of the ideal weight gain.  A physical sexual relationship may be continued up until near the end of pregnancy if there are no other problems. Problems could include early (premature) leaking of amniotic fluid from the membranes, vaginal bleeding, abdominal pain, or other medical or pregnancy problems.  Exercise regularly if there are no restrictions. Check with your caregiver if you are unsure of the safety of some of your exercises. The greatest weight gain will occur in the last 2 trimesters of pregnancy. Exercise will help you:  Control your weight.  Get you in shape for labor and delivery.  Lose weight   after you have the baby.  Wear a good support or jogging bra for breast tenderness during pregnancy. This may help if worn during sleep. Pads or tissues may be used in the bra if you are leaking colostrum.  Do not use hot tubs, steam rooms or saunas throughout the pregnancy.  Wear your seat belt at all times when driving. This protects you and your baby if you are in an accident.  Avoid raw meat, uncooked cheese, cat litter boxes and soil used by cats. These carry germs that can cause birth defects in the baby.  The second trimester is also a good time to visit your dentist for your dental health if this has not been done yet. Getting your teeth cleaned is OK. Use a soft toothbrush. Brush gently during pregnancy.  It is easier to loose urine during pregnancy. Tightening up and strengthening the pelvic muscles will help with this problem. Practice stopping  your urination while you are going to the bathroom. These are the same muscles you need to strengthen. It is also the muscles you would use as if you were trying to stop from passing gas. You can practice tightening these muscles up 10 times a set and repeating this about 3 times per day. Once you know what muscles to tighten up, do not perform these exercises during urination. It is more likely to contribute to an infection by backing up the urine.  Ask for help if you have financial, counseling or nutritional needs during pregnancy. Your caregiver will be able to offer counseling for these needs as well as refer you for other special needs.  Your skin may become oily. If so, wash your face with mild soap, use non-greasy moisturizer and oil or cream based makeup. MEDICATIONS AND DRUG USE IN PREGNANCY  Take prenatal vitamins as directed. The vitamin should contain 1 milligram of folic acid. Keep all vitamins out of reach of children. Only a couple vitamins or tablets containing iron may be fatal to a baby or young child when ingested.  Avoid use of all medications, including herbs, over-the-counter medications, not prescribed or suggested by your caregiver. Only take over-the-counter or prescription medicines for pain, discomfort, or fever as directed by your caregiver. Do not use aspirin.  Let your caregiver also know about herbs you may be using.  Alcohol is related to a number of birth defects. This includes fetal alcohol syndrome. All alcohol, in any form, should be avoided completely. Smoking will cause low birth rate and premature babies.  Street or illegal drugs are very harmful to the baby. They are absolutely forbidden. A baby born to an addicted mother will be addicted at birth. The baby will go through the same withdrawal an adult does. SEEK MEDICAL CARE IF:  You have any concerns or worries during your pregnancy. It is better to call with your questions if you feel they cannot wait,  rather than worry about them. SEEK IMMEDIATE MEDICAL CARE IF:   An unexplained oral temperature above 102 F (38.9 C) develops, or as your caregiver suggests.  You have leaking of fluid from the vagina (birth canal). If leaking membranes are suspected, take your temperature and tell your caregiver of this when you call.  There is vaginal spotting, bleeding, or passing clots. Tell your caregiver of the amount and how many pads are used. Light spotting in pregnancy is common, especially following intercourse.  You develop a bad smelling vaginal discharge with a change in the color from clear   to white.  You continue to feel sick to your stomach (nauseated) and have no relief from remedies suggested. You vomit blood or coffee ground-like materials.  You lose more than 2 pounds of weight or gain more than 2 pounds of weight over 1 week, or as suggested by your caregiver.  You notice swelling of your face, hands, feet, or legs.  You get exposed to German measles and have never had them.  You are exposed to fifth disease or chickenpox.  You develop belly (abdominal) pain. Round ligament discomfort is a common non-cancerous (benign) cause of abdominal pain in pregnancy. Your caregiver still must evaluate you.  You develop a bad headache that does not go away.  You develop fever, diarrhea, pain with urination, or shortness of breath.  You develop visual problems, blurry, or double vision.  You fall or are in a car accident or any kind of trauma.  There is mental or physical violence at home. Document Released: 01/14/2001 Document Revised: 04/14/2011 Document Reviewed: 07/19/2008 [employer] Patient Information 2013 [employer].  Breastfeeding Deciding to breastfeed is one of the best choices you can make for you and your baby. The information that follows gives a brief overview of the benefits of breastfeeding as well as common topics surrounding breastfeeding. BENEFITS OF  BREASTFEEDING For the baby  The first milk (colostrum) helps the baby's digestive system function better.   There are antibodies in the mother's milk that help the baby fight off infections.   The baby has a lower incidence of asthma, allergies, and sudden infant death syndrome (SIDS).   The nutrients in breast milk are better for the baby than infant formulas, and breast milk helps the baby's brain grow better.   Babies who breastfeed have less gas, colic, and constipation.  For the mother  Breastfeeding helps develop a very special bond between the mother and her baby.   Breastfeeding is convenient, always available at the correct temperature, and costs nothing.   Breastfeeding burns calories in the mother and helps her lose weight that was gained during pregnancy.   Breastfeeding makes the uterus contract back down to normal size faster and slows bleeding following delivery.   Breastfeeding mothers have a lower risk of developing breast cancer.  BREASTFEEDING FREQUENCY  A healthy, full-term baby may breastfeed as often as every hour or space his or her feedings to every 3 hours.   Watch your baby for signs of hunger. Nurse your baby if he or she shows signs of hunger. How often you nurse will vary from baby to baby.   Nurse as often as the baby requests, or when you feel the need to reduce the fullness of your breasts.   Awaken the baby if it has been 3 4 hours since the last feeding.   Frequent feeding will help the mother make more milk and will help prevent problems, such as sore nipples and engorgement of the breasts.  BABY'S POSITION AT THE BREAST  Whether lying down or sitting, be sure that the baby's tummy is facing your tummy.   Support the breast with 4 fingers underneath the breast and the thumb above. Make sure your fingers are well away from the nipple and baby's mouth.   Stroke the baby's lips gently with your finger or nipple.   When the  baby's mouth is open wide enough, place all of your nipple and as much of the areola as possible into your baby's mouth.   Pull the baby in   close so the tip of the nose and the baby's cheeks touch the breast during the feeding.  FEEDINGS AND SUCTION  The length of each feeding varies from baby to baby and from feeding to feeding.   The baby must suck about 2 3 minutes for your milk to get to him or her. This is called a "let down." For this reason, allow the baby to feed on each breast as long as he or she wants. Your baby will end the feeding when he or she has received the right balance of nutrients.   To break the suction, put your finger into the corner of the baby's mouth and slide it between his or her gums before removing your breast from his or her mouth. This will help prevent sore nipples.  HOW TO TELL WHETHER YOUR BABY IS GETTING ENOUGH BREAST MILK. Wondering whether or not your baby is getting enough milk is a common concern among mothers. You can be assured that your baby is getting enough milk if:   Your baby is actively sucking and you hear swallowing.   Your baby seems relaxed and satisfied after a feeding.   Your baby nurses at least 8 12 times in a 24 hour time period. Nurse your baby until he or she unlatches or falls asleep at the first breast (at least 10 20 minutes), then offer the second side.   Your baby is wetting 5 6 disposable diapers (6 8 cloth diapers) in a 24 hour period by 5 6 days of age.   Your baby is having at least 3 4 stools every 24 hours for the first 6 weeks. The stool should be soft and yellow.   Your baby should gain 4 7 ounces per week after he or she is 4 days old.   Your breasts feel softer after nursing.  REDUCING BREAST ENGORGEMENT  In the first week after your baby is born, you may experience signs of breast engorgement. When breasts are engorged, they feel heavy, warm, full, and may be tender to the touch. You can reduce  engorgement if you:   Nurse frequently, every 2 3 hours. Mothers who breastfeed early and often have fewer problems with engorgement.   Place light ice packs on your breasts for 10 20 minutes between feedings. This reduces swelling. Wrap the ice packs in a lightweight towel to protect your skin. Bags of frozen vegetables work well for this purpose.   Take a warm shower or apply warm, moist heat to your breast for 5 10 minutes just before each feeding. This increases circulation and helps the milk flow.   Gently massage your breast before and during the feeding. Using your finger tips, massage from the chest wall towards your nipple in a circular motion.   Make sure that the baby empties at least one breast at every feeding before switching sides.   Use a breast pump to empty the breasts if your baby is sleepy or not nursing well. You may also want to pump if you are returning to work oryou feel you are getting engorged.   Avoid bottle feeds, pacifiers, or supplemental feedings of water or juice in place of breastfeeding. Breast milk is all the food your baby needs. It is not necessary for your baby to have water or formula. In fact, to help your breasts make more milk, it is best not to give your baby supplemental feedings during the early weeks.   Be sure the baby is latched   on and positioned properly while breastfeeding.   Wear a supportive bra, avoiding underwire styles.   Eat a balanced diet with enough fluids.   Rest often, relax, and take your prenatal vitamins to prevent fatigue, stress, and anemia.  If you follow these suggestions, your engorgement should improve in 24 48 hours. If you are still experiencing difficulty, call your lactation consultant or caregiver.  CARING FOR YOURSELF Take care of your breasts  Bathe or shower daily.   Avoid using soap on your nipples.   Start feedings on your left breast at one feeding and on your right breast at the next  feeding.   You will notice an increase in your milk supply 2 5 days after delivery. You may feel some discomfort from engorgement, which makes your breasts very firm and often tender. Engorgement "peaks" out within 24 48 hours. In the meantime, apply warm moist towels to your breasts for 5 10 minutes before feeding. Gentle massage and expression of some milk before feeding will soften your breasts, making it easier for your baby to latch on.   Wear a well-fitting nursing bra, and air dry your nipples for a 3 4minutes after each feeding.   Only use cotton bra pads.   Only use pure lanolin on your nipples after nursing. You do not need to wash it off before feeding the baby again. Another option is to express a few drops of breast milk and gently massage it into your nipples.  Take care of yourself  Eat well-balanced meals and nutritious snacks.   Drinking milk, fruit juice, and water to satisfy your thirst (about 8 glasses a day).   Get plenty of rest.  Avoid foods that you notice affect the baby in a bad way.  SEEK MEDICAL CARE IF:   You have difficulty with breastfeeding and need help.   You have a hard, red, sore area on your breast that is accompanied by a fever.   Your baby is too sleepy to eat well or is having trouble sleeping.   Your baby is wetting less than 6 diapers a day, by 5 days of age.   Your baby's skin or white part of his or her eyes is more yellow than it was in the hospital.   You feel depressed.  Document Released: 01/20/2005 Document Revised: 07/22/2011 Document Reviewed: 04/20/2011 [employer] Patient Information 2013 [employer].  

## 2012-01-15 NOTE — Progress Notes (Signed)
+   FM, doing well--Had normal fetal ECHO--Had recent normal growth and f/u scheduled. She will get 28 wk labs next visit.

## 2012-01-26 ENCOUNTER — Other Ambulatory Visit: Payer: Self-pay | Admitting: Family Medicine

## 2012-01-26 ENCOUNTER — Ambulatory Visit (HOSPITAL_COMMUNITY)
Admission: RE | Admit: 2012-01-26 | Discharge: 2012-01-26 | Disposition: A | Discharge status: Home / Self Care | Payer: Medicaid Other | Source: Ambulatory Visit | Attending: Family Medicine | Admitting: Family Medicine

## 2012-01-26 ENCOUNTER — Ambulatory Visit (HOSPITAL_COMMUNITY): Payer: Medicaid Other

## 2012-01-26 ENCOUNTER — Encounter: Payer: Self-pay | Admitting: Family Medicine

## 2012-01-26 ENCOUNTER — Encounter (HOSPITAL_COMMUNITY): Payer: Self-pay

## 2012-01-26 VITALS — BP 110/52 | HR 108 | Wt 195.0 lb

## 2012-01-26 DIAGNOSIS — Z3490 Encounter for supervision of normal pregnancy, unspecified, unspecified trimester: Secondary | ICD-10-CM

## 2012-01-26 DIAGNOSIS — Z3689 Encounter for other specified antenatal screening: Secondary | ICD-10-CM | POA: Insufficient documentation

## 2012-01-26 DIAGNOSIS — O351XX0 Maternal care for (suspected) chromosomal abnormality in fetus, not applicable or unspecified: Secondary | ICD-10-CM

## 2012-01-26 DIAGNOSIS — O365990 Maternal care for other known or suspected poor fetal growth, unspecified trimester, not applicable or unspecified: Secondary | ICD-10-CM | POA: Insufficient documentation

## 2012-01-26 DIAGNOSIS — O358XX0 Maternal care for other (suspected) fetal abnormality and damage, not applicable or unspecified: Secondary | ICD-10-CM | POA: Insufficient documentation

## 2012-01-26 DIAGNOSIS — O3510X0 Maternal care for (suspected) chromosomal abnormality in fetus, unspecified, not applicable or unspecified: Secondary | ICD-10-CM | POA: Insufficient documentation

## 2012-01-29 ENCOUNTER — Other Ambulatory Visit (HOSPITAL_COMMUNITY): Payer: Self-pay | Admitting: Maternal and Fetal Medicine

## 2012-01-29 DIAGNOSIS — O351XX0 Maternal care for (suspected) chromosomal abnormality in fetus, not applicable or unspecified: Secondary | ICD-10-CM

## 2012-02-02 ENCOUNTER — Encounter (HOSPITAL_COMMUNITY): Payer: Self-pay

## 2012-02-02 ENCOUNTER — Ambulatory Visit (HOSPITAL_COMMUNITY)
Admission: RE | Admit: 2012-02-02 | Discharge: 2012-02-02 | Disposition: A | Discharge status: Home / Self Care | Payer: Medicaid Other | Source: Ambulatory Visit | Attending: Obstetrics and Gynecology | Admitting: Obstetrics and Gynecology

## 2012-02-02 VITALS — BP 104/59 | HR 92 | Wt 198.5 lb

## 2012-02-02 DIAGNOSIS — O365990 Maternal care for other known or suspected poor fetal growth, unspecified trimester, not applicable or unspecified: Secondary | ICD-10-CM

## 2012-02-02 DIAGNOSIS — Z3490 Encounter for supervision of normal pregnancy, unspecified, unspecified trimester: Secondary | ICD-10-CM

## 2012-02-02 DIAGNOSIS — O99330 Smoking (tobacco) complicating pregnancy, unspecified trimester: Secondary | ICD-10-CM | POA: Insufficient documentation

## 2012-02-02 DIAGNOSIS — O351XX0 Maternal care for (suspected) chromosomal abnormality in fetus, not applicable or unspecified: Secondary | ICD-10-CM

## 2012-02-02 DIAGNOSIS — O358XX0 Maternal care for other (suspected) fetal abnormality and damage, not applicable or unspecified: Secondary | ICD-10-CM | POA: Insufficient documentation

## 2012-02-02 DIAGNOSIS — O3510X0 Maternal care for (suspected) chromosomal abnormality in fetus, unspecified, not applicable or unspecified: Secondary | ICD-10-CM | POA: Insufficient documentation

## 2012-02-02 NOTE — Progress Notes (Signed)
JANETE QUILLING  was seen today for an ultrasound appointment.  See full report in [facility].  Impression: IUP at 27+0 weeks Screen positive NIPT for trisomy Normal amniotic fluid volume  UA dopplers were elevated for this GA; but no absent or reversed diastolic flow BPP 8/8  Recommendations: Continue weekly BPPs and umbilical artery Doppler studies Follow up growth scan in 2 weeks  Alpha Gula, MD

## 2012-02-04 NOTE — L&D Delivery Note (Signed)
I was present for delivery and agree with note above. Alicia Pearson,Alicia Pearson  

## 2012-02-04 NOTE — L&D Delivery Note (Signed)
Delivery Note At 12:19 PM a viable and healthy female was delivered via Vaginal, Spontaneous Delivery (Presentation: ; Occiput Anterior). 30 min post Sadol 1 mg IV w/  APGAR: 5, 7 ; weight 6 lb 1.4 oz (2761 g).   Placenta status: Intact, Spontaneous.  Cord: 3 vessels with the following complications.  Cord pH: 7.39  Anesthesia: None  Episiotomy: None Lacerations: None Est. Blood Loss (mL): 300   Mom to postpartum.  Baby to nursery-stable.  Gregor Hams 04/09/2012, 12:42 PM  At 1219 this 27 y/o G4 now P4004 delivered a viable female infant over an intact perineum by NSVD with APGARs at 5 and 7.  Pt's pain was managed IV pain medication.  Infant was placed on mom's chest after delivery and cord clamping was delayed till pulsation and stopped within the cord.  Cord was clamped and Dad cut the cord.  Cord blood was collected and sent.  Intact 3 vessel cord placenta were delivered spontaneiously.  Cervix and vagina were inspected.  EBL 300.  Pt stable to mother baby and baby to [facility].  Delivery supervised by Sid Falcon and preformed by Gregor Hams, DO.

## 2012-02-12 ENCOUNTER — Ambulatory Visit (HOSPITAL_COMMUNITY)
Admission: RE | Admit: 2012-02-12 | Discharge: 2012-02-12 | Disposition: A | Discharge status: Home / Self Care | Payer: Medicaid Other | Source: Ambulatory Visit | Attending: Obstetrics and Gynecology | Admitting: Obstetrics and Gynecology

## 2012-02-12 ENCOUNTER — Ambulatory Visit (INDEPENDENT_AMBULATORY_CARE_PROVIDER_SITE_OTHER): Payer: Medicaid Other | Admitting: Obstetrics and Gynecology

## 2012-02-12 ENCOUNTER — Encounter: Payer: Self-pay | Admitting: Obstetrics and Gynecology

## 2012-02-12 ENCOUNTER — Ambulatory Visit (HOSPITAL_COMMUNITY): Payer: Medicaid Other

## 2012-02-12 VITALS — BP 98/63 | Wt 192.0 lb

## 2012-02-12 VITALS — BP 106/62 | HR 95 | Wt 193.8 lb

## 2012-02-12 DIAGNOSIS — O365990 Maternal care for other known or suspected poor fetal growth, unspecified trimester, not applicable or unspecified: Secondary | ICD-10-CM

## 2012-02-12 DIAGNOSIS — Z23 Encounter for immunization: Secondary | ICD-10-CM

## 2012-02-12 DIAGNOSIS — Z3490 Encounter for supervision of normal pregnancy, unspecified, unspecified trimester: Secondary | ICD-10-CM

## 2012-02-12 DIAGNOSIS — O358XX0 Maternal care for other (suspected) fetal abnormality and damage, not applicable or unspecified: Secondary | ICD-10-CM | POA: Insufficient documentation

## 2012-02-12 DIAGNOSIS — Z3480 Encounter for supervision of other normal pregnancy, unspecified trimester: Secondary | ICD-10-CM

## 2012-02-12 DIAGNOSIS — O3510X0 Maternal care for (suspected) chromosomal abnormality in fetus, unspecified, not applicable or unspecified: Secondary | ICD-10-CM | POA: Insufficient documentation

## 2012-02-12 DIAGNOSIS — O351XX0 Maternal care for (suspected) chromosomal abnormality in fetus, not applicable or unspecified: Secondary | ICD-10-CM | POA: Insufficient documentation

## 2012-02-12 DIAGNOSIS — O99330 Smoking (tobacco) complicating pregnancy, unspecified trimester: Secondary | ICD-10-CM | POA: Insufficient documentation

## 2012-02-12 MED ORDER — TETANUS-DIPHTH-ACELL PERTUSSIS 5-2.5-18.5 LF-MCG/0.5 IM SUSP: 0.5000 mL | Freq: Once | INTRAMUSCULAR | Status: DC

## 2012-02-12 NOTE — Progress Notes (Signed)
[facility]  Indication: 27 yr old G45P3004 at [redacted]w[redacted]d with fetus with suspected trisomy 21 based on cell free fetal DNA and lagging abdominal circumference for biophysical profile and Doppler studies.  Findings: 1. Single intratuerine pregnancy. 2. Anterior placenta without evidence of previa. 3. Normal amniotic fluid volume. 4. Normal biophysical profile of 8/8. 5. Normal umbilical artery Doppler studies.  Recommendations: 1. Fetus with suspected trisomy 49: - previously counseled - had normal fetal echocardiogram - declined confirmatory testing with amniocentesis - recommend inform Pediatricians at delivery and test neonate 2. Previous finding of lagging abdominal circumference/elevated umbilical artery Doppler studies: - previously counseled - fetal growth next week - if growth is normal recommend discontinue BPPs and Doppler studies - BPP and Doppler studies normal today  Alicia Foster, MD

## 2012-02-12 NOTE — Addendum Note (Signed)
Addended by: Barbara Cower on: 02/12/2012 10:44 AM   Modules accepted: Orders

## 2012-02-12 NOTE — Addendum Note (Signed)
Addended by: Shantoria Ellwood on: 02/12/2012 10:34 AM   Modules accepted: Level of Service  

## 2012-02-12 NOTE — Progress Notes (Signed)
Patient doing well without any complaints. Scheduled for MFM ultrasound today. Labs today. FM/PTL precautions reviewed

## 2012-02-13 LAB — CBC
MCHC: 34.9 g/dL (ref 30.0–36.0)
MCV: 91.3 fL (ref 78.0–100.0)
Platelets: 228 10*3/uL (ref 150–400)
RDW: 14.7 % (ref 11.5–15.5)
WBC: 10.2 10*3/uL (ref 4.0–10.5)

## 2012-02-13 LAB — HIV ANTIBODY (ROUTINE TESTING W REFLEX): HIV: NONREACTIVE

## 2012-02-16 ENCOUNTER — Encounter: Payer: Self-pay | Admitting: Obstetrics and Gynecology

## 2012-02-19 ENCOUNTER — Ambulatory Visit (HOSPITAL_COMMUNITY)
Admission: RE | Admit: 2012-02-19 | Discharge: 2012-02-19 | Disposition: A | Discharge status: Home / Self Care | Payer: Medicaid Other | Source: Ambulatory Visit | Attending: Obstetrics and Gynecology | Admitting: Obstetrics and Gynecology

## 2012-02-19 VITALS — BP 107/61 | HR 85 | Wt 200.5 lb

## 2012-02-19 DIAGNOSIS — Z3490 Encounter for supervision of normal pregnancy, unspecified, unspecified trimester: Secondary | ICD-10-CM

## 2012-02-19 DIAGNOSIS — O3510X0 Maternal care for (suspected) chromosomal abnormality in fetus, unspecified, not applicable or unspecified: Secondary | ICD-10-CM | POA: Insufficient documentation

## 2012-02-19 DIAGNOSIS — O365990 Maternal care for other known or suspected poor fetal growth, unspecified trimester, not applicable or unspecified: Secondary | ICD-10-CM

## 2012-02-19 DIAGNOSIS — O358XX0 Maternal care for other (suspected) fetal abnormality and damage, not applicable or unspecified: Secondary | ICD-10-CM | POA: Insufficient documentation

## 2012-02-19 DIAGNOSIS — O99330 Smoking (tobacco) complicating pregnancy, unspecified trimester: Secondary | ICD-10-CM | POA: Insufficient documentation

## 2012-02-19 DIAGNOSIS — O351XX0 Maternal care for (suspected) chromosomal abnormality in fetus, not applicable or unspecified: Secondary | ICD-10-CM | POA: Insufficient documentation

## 2012-02-19 NOTE — Progress Notes (Signed)
AI SONNENFELD  was seen today for an ultrasound appointment.  See full report in [facility].  Comments: Ms. Wideman returns for follow up due to fetus with suspected Trisomy 21 by cell free fetal DNA and lagging abdominal circumference.  Impression: Single IUP at 29 3/7 weeks Estimated fetal weight today is at the 24th %tile.  [facility] measures at the 15th %tile Umbilical artery Doppler studies elevated (94th %tile) without evidence of absent or reversed diastolic flow The fetus is active with a BPP of 8/8 Normal amniotic fluid volume  Recommendations: Will continue weekly BPPs and UA Dopplers Follow up growth scan in 3 weeks  Alpha Gula, MD

## 2012-02-26 ENCOUNTER — Ambulatory Visit (HOSPITAL_COMMUNITY)
Admission: RE | Admit: 2012-02-26 | Discharge: 2012-02-26 | Disposition: A | Discharge status: Home / Self Care | Payer: Medicaid Other | Source: Ambulatory Visit | Attending: Obstetrics and Gynecology | Admitting: Obstetrics and Gynecology

## 2012-02-26 VITALS — BP 105/62 | HR 92 | Wt 201.5 lb

## 2012-02-26 DIAGNOSIS — O3510X0 Maternal care for (suspected) chromosomal abnormality in fetus, unspecified, not applicable or unspecified: Secondary | ICD-10-CM | POA: Insufficient documentation

## 2012-02-26 DIAGNOSIS — O351XX0 Maternal care for (suspected) chromosomal abnormality in fetus, not applicable or unspecified: Secondary | ICD-10-CM | POA: Insufficient documentation

## 2012-02-26 DIAGNOSIS — O365990 Maternal care for other known or suspected poor fetal growth, unspecified trimester, not applicable or unspecified: Secondary | ICD-10-CM

## 2012-02-26 DIAGNOSIS — O358XX0 Maternal care for other (suspected) fetal abnormality and damage, not applicable or unspecified: Secondary | ICD-10-CM | POA: Insufficient documentation

## 2012-02-26 DIAGNOSIS — Z3490 Encounter for supervision of normal pregnancy, unspecified, unspecified trimester: Secondary | ICD-10-CM

## 2012-02-26 DIAGNOSIS — O99330 Smoking (tobacco) complicating pregnancy, unspecified trimester: Secondary | ICD-10-CM | POA: Insufficient documentation

## 2012-02-26 NOTE — Progress Notes (Signed)
Alicia Pearson  was seen today for an ultrasound appointment.  See full report in [facility].  Single IUP at 30 3/7 weeks Trisomy 21 by NIPT.  Lagging AC noted on previous ultrasound. Umbilical artery Doppler studies elevated (97th %tile for gestational age) without evidence of absent or reversed diastolic flow The fetus is active with a BPP of 8/8 Normal amniotic fluid volume  Recommend continued weeky BPPs with UA Dopplers. Follow up growth scan in 2 weeks.  Alpha Gula, MD

## 2012-03-01 ENCOUNTER — Inpatient Hospital Stay (HOSPITAL_COMMUNITY)
Admission: AD | Admit: 2012-03-01 | Discharge: 2012-03-01 | Disposition: A | Discharge status: Hospice | Payer: Medicaid Other | Source: Ambulatory Visit | Attending: Obstetrics & Gynecology | Admitting: Obstetrics & Gynecology

## 2012-03-01 ENCOUNTER — Encounter (HOSPITAL_COMMUNITY): Payer: Self-pay

## 2012-03-01 DIAGNOSIS — R0602 Shortness of breath: Secondary | ICD-10-CM

## 2012-03-01 DIAGNOSIS — E860 Dehydration: Secondary | ICD-10-CM | POA: Insufficient documentation

## 2012-03-01 DIAGNOSIS — O99891 Other specified diseases and conditions complicating pregnancy: Secondary | ICD-10-CM | POA: Insufficient documentation

## 2012-03-01 DIAGNOSIS — O2650 Maternal hypotension syndrome, unspecified trimester: Secondary | ICD-10-CM | POA: Insufficient documentation

## 2012-03-01 DIAGNOSIS — I517 Cardiomegaly: Secondary | ICD-10-CM

## 2012-03-01 DIAGNOSIS — R55 Syncope and collapse: Secondary | ICD-10-CM

## 2012-03-01 DIAGNOSIS — R42 Dizziness and giddiness: Secondary | ICD-10-CM

## 2012-03-01 HISTORY — DX: Tachycardia, unspecified: R00.0

## 2012-03-01 LAB — URINALYSIS, ROUTINE W REFLEX MICROSCOPIC
Bilirubin Urine: NEGATIVE
Nitrite: NEGATIVE
Specific Gravity, Urine: >1.03 — ABNORMAL HIGH (ref 1.005–1.030)
pH: 5.5 (ref 5.0–8.0)

## 2012-03-01 LAB — BASIC METABOLIC PANEL
BUN: 6 mg/dL (ref 6–23)
Chloride: 101 mEq/L (ref 96–112)
GFR calc Af Amer: >90 mL/min (ref >90)
Potassium: 3.6 mEq/L (ref 3.5–5.1)
Sodium: 134 mEq/L — ABNORMAL LOW (ref 135–145)

## 2012-03-01 LAB — URINE MICROSCOPIC-ADD ON

## 2012-03-01 NOTE — MAU Note (Addendum)
Patient is in with c/o feeling lightheaded and felt short of breath at that time only about a couple of hours ago. She did say that she have not been drinking water today. She denies any vaginal bleeding or lof.

## 2012-03-01 NOTE — MAU Provider Note (Signed)
History     CSN: [id number]  Arrival date and time: 03/01/12 1439   None     Chief Complaint  Patient presents with  . Dizziness  . Shortness of Breath   HPI 27 y.o. [id number] @ [redacted]w[redacted]d presents with an episode this afternoon of lightheadedness, palpitations, and SOB. Symptoms resolved in a couple of minutes after she put herself on all fours on the floor with her head below her waist. She does have a history of an episode of SVT for which she was seen at an ED. The cause was never found.  This episode today happened after she ate lunch while she was sitting in a chair working on her computer.  She feels like she did not do anything different this morning than usual.  She and her husband feel like she does not drink enough water and she usually does not eat breakfast.  She denies chest pain, passing out, recent sickness, fever, chills, vaginal bleeding or cramping.  She endorses what she calls a "normal white discharge" and good fetal movement.   OB History    Grav Para Term Preterm Abortions TAB SAB Ect Mult Living   4 3 3      1 4       Past Medical History  Diagnosis Date  . Tachycardia     history, not on meds    Past Surgical History  Procedure Date  . Wisdom tooth extraction     Family History  Problem Relation Age of Onset  . Cancer Mother     breast 2003  . Hypertension Mother   . Aneurysm Mother     x2  . Alcohol abuse Father   . Alcohol abuse Brother   . Alzheimer's disease Maternal Grandmother     History  Substance Use Topics  . Smoking status: Never Smoker   . Smokeless tobacco: Not on file  . Alcohol Use: No    Allergies: No Known Allergies  Prescriptions prior to admission  Medication Sig Dispense Refill  . Prenatal Vit-Fe Fumarate-FA (PRENATAL MULTIVITAMIN) TABS Take 1 tablet by mouth daily.        Review of Systems  Constitutional: Negative.  Negative for fever, chills, malaise/fatigue and diaphoresis.  HENT: Negative.   Eyes: Negative.   Negative for blurred vision.  Respiratory: Positive for shortness of breath. Negative for cough.   Cardiovascular: Positive for palpitations. Negative for chest pain and leg swelling.  Gastrointestinal: Negative.  Negative for heartburn, nausea, vomiting and abdominal pain.  Genitourinary: Negative for dysuria, urgency and frequency.  Skin: Negative.  Negative for itching and rash.  Neurological: Positive for dizziness. Negative for tingling, sensory change, loss of consciousness, weakness and headaches.  Psychiatric/Behavioral: Negative.    Physical Exam   Last menstrual period 08/02/2011.  Physical Exam  Constitutional: She is oriented to person, place, and time. She appears well-developed and well-nourished. No distress.  Eyes: Conjunctivae normal and EOM are normal. Pupils are equal, round, and reactive to light.  Neck: Normal range of motion. Neck supple. No JVD present.  Cardiovascular: Normal rate, regular rhythm, normal heart sounds and intact distal pulses.  Exam reveals no gallop and no friction rub.   No murmur heard. Respiratory: Effort normal and breath sounds normal. No respiratory distress. She has no wheezes. She has no rales.  GI: Soft. Bowel sounds are normal.  Musculoskeletal: Normal range of motion.  Neurological: She is alert and oriented to person, place, and time. She has normal reflexes.  Skin:  Skin is warm and dry. She is not diaphoretic.  Psychiatric: She has a normal mood and affect.   Pelvic exam deferred  MAU Course  Procedures Results for orders placed during the hospital encounter of 03/01/12 (from the past 24 hour(s))  URINALYSIS, ROUTINE W REFLEX MICROSCOPIC     Status: Abnormal   Collection Time   03/01/12  2:58 PM      Component Value Range   Color, Urine YELLOW  YELLOW   APPearance HAZY (*) CLEAR   Specific Gravity, Urine >1.030 (*) 1.005 - 1.030   pH 5.5  5.0 - 8.0   Glucose, UA NEGATIVE  NEGATIVE mg/dL   Hgb urine dipstick NEGATIVE   NEGATIVE   Bilirubin Urine NEGATIVE  NEGATIVE   Ketones, ur 15 (*) NEGATIVE mg/dL   Protein, ur 30 (*) NEGATIVE mg/dL   Urobilinogen, UA 0.2  0.0 - 1.0 mg/dL   Nitrite NEGATIVE  NEGATIVE   Leukocytes, UA TRACE (*) NEGATIVE  URINE MICROSCOPIC-ADD ON     Status: Abnormal   Collection Time   03/01/12  2:58 PM      Component Value Range   Squamous Epithelial / LPF MANY (*) RARE   WBC, UA 0-2  <3 WBC/hpf   Bacteria, UA MANY (*) RARE   Crystals CA OXALATE CRYSTALS (*) NEGATIVE  BASIC METABOLIC PANEL     Status: Abnormal   Collection Time   03/01/12  3:24 PM      Component Value Range   Sodium 134 (*) 135 - 145 mEq/L   Potassium 3.6  3.5 - 5.1 mEq/L   Chloride 101  96 - 112 mEq/L   CO2 23  19 - 32 mEq/L   Glucose, Bld 92  70 - 99 mg/dL   BUN 6  6 - 23 mg/dL   Creatinine, Ser 1.61  0.50 - 1.10 mg/dL   Calcium 8.5  8.4 - 09.6 mg/dL   GFR calc non Af Amer >90  >90 mL/min   GFR calc Af Amer >90  >90 mL/min    Category I FHR tracing   Assessment and Plan  A: Pre-syncopal episode from dehydration, poor eating and pregnancy. UA shows dehydration,  BMP WNL with NA+ 134, EKG shows no SVT or AFIB. P: BMP EKG UA Pushed PO fluids while in room. Instructed to keep hydrated with more fluids throughout day Instructed to eat something for breakfast everyday Henningsgaard, Elige Radon 03/01/2012, 3:28 PM   I have seen this patient and agree with the above resident's note with the following additions.    EKG done today in MAU read by Dr Jens Som, cardiologist on call, and mild ventricular hypertrophy noted.  Per Dr Jens Som, pt does not need cardiology follow up for this unless symptomatic with additional syncope, SOB, or CP.  Pt has not seen cardiology in the past, and has history of single episode of SVT evaluated in an emergency room ~4 years ago.  Pt to f/u at [facility] or return to MAU if symptoms persist/worsen.  LEFTWICH-KIRBY, Peggi Yono Certified Nurse-Midwife

## 2012-03-01 NOTE — MAU Provider Note (Signed)
Attestation of Attending Supervision of Advanced Practitioner (CNM/NP): Evaluation and management procedures were performed by the Advanced Practitioner under my supervision and collaboration. I have reviewed the Advanced Practitioner's note and chart, and I agree with the management and plan.  LEGGETT,KELLY H. 10:47 PM

## 2012-03-04 ENCOUNTER — Encounter: Payer: Medicaid Other | Admitting: Obstetrics and Gynecology

## 2012-03-04 ENCOUNTER — Ambulatory Visit (HOSPITAL_COMMUNITY)
Admission: RE | Admit: 2012-03-04 | Discharge: 2012-03-04 | Disposition: A | Discharge status: Home / Self Care | Payer: Medicaid Other | Source: Ambulatory Visit | Attending: Obstetrics and Gynecology | Admitting: Obstetrics and Gynecology

## 2012-03-04 VITALS — BP 116/63 | HR 95 | Wt 201.5 lb

## 2012-03-04 DIAGNOSIS — O351XX0 Maternal care for (suspected) chromosomal abnormality in fetus, not applicable or unspecified: Secondary | ICD-10-CM

## 2012-03-04 DIAGNOSIS — O365990 Maternal care for other known or suspected poor fetal growth, unspecified trimester, not applicable or unspecified: Secondary | ICD-10-CM

## 2012-03-04 DIAGNOSIS — Z3490 Encounter for supervision of normal pregnancy, unspecified, unspecified trimester: Secondary | ICD-10-CM

## 2012-03-04 DIAGNOSIS — O99330 Smoking (tobacco) complicating pregnancy, unspecified trimester: Secondary | ICD-10-CM | POA: Insufficient documentation

## 2012-03-04 DIAGNOSIS — O3510X0 Maternal care for (suspected) chromosomal abnormality in fetus, unspecified, not applicable or unspecified: Secondary | ICD-10-CM | POA: Insufficient documentation

## 2012-03-04 DIAGNOSIS — O358XX0 Maternal care for other (suspected) fetal abnormality and damage, not applicable or unspecified: Secondary | ICD-10-CM | POA: Insufficient documentation

## 2012-03-04 NOTE — Progress Notes (Signed)
Alicia Pearson  was seen today for an ultrasound appointment.  See full report in [facility].  Impression Single IUP at 30 3/7 weeks Trisomy 21 by NIPT.  Lagging AC noted on previous ultrasound. Umbilical artery Doppler studies elevated (97th %tile for gestational age) without evidence of absent or reversed diastolic flow The fetus is active with a BPP of 8/8 Normal amniotic fluid volume  Recommendations Follow up growth scan, BPP with UA Dopplers next week Recommend 2x weekly testing after 32 weeks (weekly NST in clinic and weekly BPP with UA Dopplers with MFM)  Alpha Gula, MD

## 2012-03-11 ENCOUNTER — Ambulatory Visit (HOSPITAL_COMMUNITY)
Admission: RE | Admit: 2012-03-11 | Discharge: 2012-03-11 | Disposition: A | Discharge status: Home / Self Care | Payer: Medicaid Other | Source: Ambulatory Visit | Attending: Obstetrics and Gynecology | Admitting: Obstetrics and Gynecology

## 2012-03-11 VITALS — BP 120/67 | HR 97 | Wt 202.0 lb

## 2012-03-11 DIAGNOSIS — O358XX0 Maternal care for other (suspected) fetal abnormality and damage, not applicable or unspecified: Secondary | ICD-10-CM | POA: Insufficient documentation

## 2012-03-11 DIAGNOSIS — O99330 Smoking (tobacco) complicating pregnancy, unspecified trimester: Secondary | ICD-10-CM | POA: Insufficient documentation

## 2012-03-11 DIAGNOSIS — O365990 Maternal care for other known or suspected poor fetal growth, unspecified trimester, not applicable or unspecified: Secondary | ICD-10-CM

## 2012-03-11 DIAGNOSIS — O351XX0 Maternal care for (suspected) chromosomal abnormality in fetus, not applicable or unspecified: Secondary | ICD-10-CM | POA: Insufficient documentation

## 2012-03-11 DIAGNOSIS — O3510X0 Maternal care for (suspected) chromosomal abnormality in fetus, unspecified, not applicable or unspecified: Secondary | ICD-10-CM | POA: Insufficient documentation

## 2012-03-11 DIAGNOSIS — Z3490 Encounter for supervision of normal pregnancy, unspecified, unspecified trimester: Secondary | ICD-10-CM

## 2012-03-18 ENCOUNTER — Ambulatory Visit (HOSPITAL_COMMUNITY): Payer: Medicaid Other

## 2012-03-18 ENCOUNTER — Encounter: Payer: Medicaid Other | Admitting: Obstetrics & Gynecology

## 2012-03-20 ENCOUNTER — Other Ambulatory Visit: Payer: Self-pay

## 2012-03-22 ENCOUNTER — Other Ambulatory Visit (HOSPITAL_COMMUNITY): Payer: Self-pay | Admitting: Maternal and Fetal Medicine

## 2012-03-22 DIAGNOSIS — O351XX0 Maternal care for (suspected) chromosomal abnormality in fetus, not applicable or unspecified: Secondary | ICD-10-CM

## 2012-03-23 ENCOUNTER — Ambulatory Visit (HOSPITAL_COMMUNITY)
Admission: RE | Admit: 2012-03-23 | Discharge: 2012-03-23 | Disposition: A | Discharge status: Home / Self Care | Payer: Medicaid Other | Source: Ambulatory Visit | Attending: Obstetrics & Gynecology | Admitting: Obstetrics & Gynecology

## 2012-03-23 VITALS — BP 106/67 | HR 106 | Wt 207.5 lb

## 2012-03-23 DIAGNOSIS — O351XX0 Maternal care for (suspected) chromosomal abnormality in fetus, not applicable or unspecified: Secondary | ICD-10-CM | POA: Insufficient documentation

## 2012-03-23 DIAGNOSIS — O358XX0 Maternal care for other (suspected) fetal abnormality and damage, not applicable or unspecified: Secondary | ICD-10-CM | POA: Insufficient documentation

## 2012-03-23 DIAGNOSIS — O3510X0 Maternal care for (suspected) chromosomal abnormality in fetus, unspecified, not applicable or unspecified: Secondary | ICD-10-CM | POA: Insufficient documentation

## 2012-03-23 DIAGNOSIS — O99330 Smoking (tobacco) complicating pregnancy, unspecified trimester: Secondary | ICD-10-CM | POA: Insufficient documentation

## 2012-03-23 DIAGNOSIS — O365990 Maternal care for other known or suspected poor fetal growth, unspecified trimester, not applicable or unspecified: Secondary | ICD-10-CM

## 2012-03-23 DIAGNOSIS — Z3490 Encounter for supervision of normal pregnancy, unspecified, unspecified trimester: Secondary | ICD-10-CM

## 2012-03-27 ENCOUNTER — Inpatient Hospital Stay (HOSPITAL_COMMUNITY)
Admission: AD | Admit: 2012-03-27 | Discharge: 2012-03-27 | Disposition: A | Discharge status: Home / Self Care | Payer: Medicaid Other | Source: Ambulatory Visit | Attending: Family Medicine | Admitting: Family Medicine

## 2012-03-27 ENCOUNTER — Encounter (HOSPITAL_COMMUNITY): Payer: Self-pay | Admitting: Obstetrics and Gynecology

## 2012-03-27 DIAGNOSIS — O365990 Maternal care for other known or suspected poor fetal growth, unspecified trimester, not applicable or unspecified: Secondary | ICD-10-CM

## 2012-03-27 DIAGNOSIS — O4700 False labor before 37 completed weeks of gestation, unspecified trimester: Secondary | ICD-10-CM | POA: Insufficient documentation

## 2012-03-27 DIAGNOSIS — O3510X0 Maternal care for (suspected) chromosomal abnormality in fetus, unspecified, not applicable or unspecified: Secondary | ICD-10-CM | POA: Insufficient documentation

## 2012-03-27 LAB — URINALYSIS, ROUTINE W REFLEX MICROSCOPIC
Nitrite: NEGATIVE
Specific Gravity, Urine: 1.025 (ref 1.005–1.030)
pH: 7 (ref 5.0–8.0)

## 2012-03-27 LAB — URINE MICROSCOPIC-ADD ON

## 2012-03-27 MED ORDER — OXYCODONE-ACETAMINOPHEN 5-325 MG PO TABS
1.0000 | ORAL_TABLET | Freq: Four times a day (QID) | ORAL | Status: DC | PRN
  Administered 2012-03-27: 2 via ORAL
  Filled 2012-03-27: qty 2
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
## 2012-03-27 NOTE — MAU Provider Note (Signed)
Chief Complaint:  Contractions and Pelvic Pain     HPI: Alicia Pearson is a 27 y.o. [phone] at [redacted]w[redacted]d who presents to maternity admissions reporting pel;vic pressure and contractions felt in lower abdomen. Denies dysuria, urgency, frequency of urination. Denies leakage of fluid or vaginal bleeding. Good fetal movement.   Pregnancy Course: Lapsed care at [facility] due to no transportation. Fetus with suspected Down syndrome  Past Medical History: Past Medical History  Diagnosis Date  . Tachycardia     history, not on meds    Past obstetric history: OB History   Grav Para Term Preterm Abortions TAB SAB Ect Mult Living   4 3 3      1 4      # Outc Date GA Lbr Len/2nd Wgt Sex Del Anes PTL Lv   1 TRM 9/00 [redacted]w[redacted]d   M SVD EPI  Yes   2A TRM 1/03 [redacted]w[redacted]d   M SVD EPI  Yes   2B  1/03 [redacted]w[redacted]d   M SVD EPI  Yes   3 TRM 5/07 [redacted]w[redacted]d   M SVD EPI No Yes   4 CUR               Past Surgical History: Past Surgical History  Procedure Laterality Date  . Wisdom tooth extraction      Family History: Family History  Problem Relation Age of Onset  . Cancer Mother     breast 2003  . Hypertension Mother   . Aneurysm Mother     x2  . Alcohol abuse Father   . Alcohol abuse Brother   . Alzheimer's disease Maternal Grandmother     Social History: History  Substance Use Topics  . Smoking status: Never Smoker   . Smokeless tobacco: Not on file  . Alcohol Use: No    Allergies: No Known Allergies  Meds:  Prescriptions prior to admission  Medication Sig Dispense Refill  . Prenatal Vit-Fe Fumarate-FA (PRENATAL MULTIVITAMIN) TABS Take 1 tablet by mouth daily.        ROS: Pertinent findings in history of present illness.  Physical Exam  Blood pressure 120/69, pulse 111, temperature 98.3 F (36.8 C), temperature source Oral, resp. rate 18, height 5\' 4"  (1.626 m), weight 93.895 kg (207 lb), last menstrual period 08/02/2011. GENERAL: Well-developed, well-nourished female in no acute  distress.  HEENT: normocephalic HEART: normal rate RESP: normal effort ABDOMEN: Soft, non-tender, gravid appropriate for gestational age EXTREMITIES: Nontender, no edema NEURO: alert and oriented SPECULUM EXAM: NEFG, physiologic discharge, no blood, cervix clean Dilation: 1 Effacement (%): 50 Cervical Position: Middle Station: -2 Presentation: Vertex Exam by:: Ginger Morris RN  FHT:  Baseline 130 , moderate variability, accelerations present, no decelerations Contractions: occ, mild   Labs: Results for orders placed during the hospital encounter of 03/27/12 (from the past 24 hour(s))  URINALYSIS, ROUTINE W REFLEX MICROSCOPIC     Status: Abnormal   Collection Time    03/27/12  3:30 PM      Result Value Range   Color, Urine YELLOW  YELLOW   APPearance HAZY (*) CLEAR   Specific Gravity, Urine 1.025  1.005 - 1.030   pH 7.0  5.0 - 8.0   Glucose, UA NEGATIVE  NEGATIVE mg/dL   Hgb urine dipstick NEGATIVE  NEGATIVE   Bilirubin Urine NEGATIVE  NEGATIVE   Ketones, ur NEGATIVE  NEGATIVE mg/dL   Protein, ur NEGATIVE  NEGATIVE mg/dL   Urobilinogen, UA 0.2  0.0 - 1.0 mg/dL   Nitrite  NEGATIVE  NEGATIVE   Leukocytes, UA MODERATE (*) NEGATIVE  URINE MICROSCOPIC-ADD ON     Status: Abnormal   Collection Time    03/27/12  3:30 PM      Result Value Range   Squamous Epithelial / LPF MANY (*) RARE   WBC, UA 11-20  <3 WBC/hpf   Bacteria, UA MANY (*) RARE   Urine-Other MUCOUS PRESENT     .   MAU Course: Percocet 1 po given with good relief  Assessment: 1. Symmetric IUGR complicating pregnancy, antepartum   2. 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 Supervision of normal pregnancy   4. False labor before 37 completed weeks of gestation, third trimester   [id number] at [redacted]w[redacted]d  Plan: Discharge home Urine C&S sent Labor precautions and fetal kick counts Tylenol for pelvic pain   Medication List    TAKE these medications       prenatal multivitamin  Tabs  Take 1 tablet by mouth daily.       Follow-up Information   Schedule an appointment as soon as possible for a visit with [facility]. (Call Monday to get an appointment)    Contact information:   [address] [place] [address] [phone]      Danae Orleans, CNM 03/27/2012 4:03 PM

## 2012-03-27 NOTE — MAU Provider Note (Signed)
Chart reviewed and agree with management and plan.  

## 2012-03-28 ENCOUNTER — Inpatient Hospital Stay (HOSPITAL_COMMUNITY)
Admission: AD | Admit: 2012-03-28 | Discharge: 2012-03-28 | Disposition: A | Discharge status: Home / Self Care | Payer: Medicaid Other | Source: Ambulatory Visit | Attending: Obstetrics and Gynecology | Admitting: Obstetrics and Gynecology

## 2012-03-28 ENCOUNTER — Encounter (HOSPITAL_COMMUNITY): Payer: Self-pay | Admitting: *Deleted

## 2012-03-28 DIAGNOSIS — O99891 Other specified diseases and conditions complicating pregnancy: Secondary | ICD-10-CM | POA: Insufficient documentation

## 2012-03-28 DIAGNOSIS — O3510X0 Maternal care for (suspected) chromosomal abnormality in fetus, unspecified, not applicable or unspecified: Secondary | ICD-10-CM | POA: Insufficient documentation

## 2012-03-28 DIAGNOSIS — O365990 Maternal care for other known or suspected poor fetal growth, unspecified trimester, not applicable or unspecified: Secondary | ICD-10-CM | POA: Insufficient documentation

## 2012-03-28 LAB — URINALYSIS, ROUTINE W REFLEX MICROSCOPIC
Ketones, ur: 15 mg/dL — AB
Leukocytes, UA: NEGATIVE
Nitrite: NEGATIVE
Specific Gravity, Urine: 1.025 (ref 1.005–1.030)
pH: 6.5 (ref 5.0–8.0)

## 2012-03-28 LAB — URINE CULTURE

## 2012-03-28 LAB — URINE MICROSCOPIC-ADD ON

## 2012-03-28 NOTE — MAU Note (Signed)
Pt G4 P4 at 35wks leaking clear fluid at 2030.  No contractions at this time.  Seen in MAU 2/22, SVE 1cm.

## 2012-03-28 NOTE — MAU Provider Note (Signed)
History     CSN: [id number]  Arrival date and time: 03/28/12 2056   None     Chief Complaint  Patient presents with  . Rupture of Membranes   HPI 27 y/o [id number] here with loss of fluid and contrinued abd/back/pelvic pain. Her loss of lfuid started suddenly this afternoon, it soaked her underwear and appeared to be clear. SHe is having moderately strong irregular contractions. HEr babu is moving normally. SHe denies vaginal bleeding, discharge (except for fluid), and irritation. Her back, abd, and pelvic pain is the same as it was yesterday and is not really helped by tylenol. She denies fevers, chills, sweats, vomiting, and swelling. Her bowels are moving slower than previously and she has some short lived nausea after eating occasionally.   It's likely from previous fetal testing that her baby has downs.   OB History   Grav Para Term Preterm Abortions TAB SAB Ect Mult Living   4 3 3      1 4       Past Medical History  Diagnosis Date  . Tachycardia     history, not on meds    Past Surgical History  Procedure Laterality Date  . Wisdom tooth extraction      Family History  Problem Relation Age of Onset  . Cancer Mother     breast 2003  . Hypertension Mother   . Aneurysm Mother     x2  . Alcohol abuse Father   . Alcohol abuse Brother   . Alzheimer's disease Maternal Grandmother     History  Substance Use Topics  . Smoking status: Never Smoker   . Smokeless tobacco: Not on file  . Alcohol Use: No    Allergies: No Known Allergies  Prescriptions prior to admission  Medication Sig Dispense Refill  . Prenatal Vit-Fe Fumarate-FA (PRENATAL MULTIVITAMIN) TABS Take 1 tablet by mouth daily.        ROS  Per hpi  Physical Exam   Blood pressure 125/67, pulse 118, temperature 97.9 F (36.6 C), temperature source Oral, resp. rate 18, height 5\' 4"  (1.626 m), weight 94.53 kg (208 lb 6.4 oz), last menstrual period 08/02/2011.  Physical Exam Gen: NAD, alert, cooperative  with exam HEENT: NCAT, EOMI, PERRL CV: RRR, good S1/S2, no murmur Resp: CTABL, no wheezes, non-labored Abd: Soft, pregnant abdomen, non tender to palpation Ext: No edema, warm Neuro: Alert and oriented, No gross deficits GU: thick white cottage cheese appearing fluid in the posterior fornix, no blood CERVIX: dilation: 1, Effacement (%): 50, Cervical Position: Middle   Fetal heart tones: baseline 140, mod variability, accels present, no decels Toco: no contractions identified  MAU Course  Procedures  Results for orders placed during the hospital encounter of 03/28/12 (from the past 24 hour(s))  URINALYSIS, ROUTINE W REFLEX MICROSCOPIC     Status: Abnormal   Collection Time    03/28/12  9:16 PM      Result Value Range   Color, Urine YELLOW  YELLOW   APPearance CLEAR  CLEAR   Specific Gravity, Urine 1.025  1.005 - 1.030   pH 6.5  5.0 - 8.0   Glucose, UA NEGATIVE  NEGATIVE mg/dL   Hgb urine dipstick NEGATIVE  NEGATIVE   Bilirubin Urine NEGATIVE  NEGATIVE   Ketones, ur 15 (*) NEGATIVE mg/dL   Protein, ur 30 (*) NEGATIVE mg/dL   Urobilinogen, UA 1.0  0.0 - 1.0 mg/dL   Nitrite NEGATIVE  NEGATIVE   Leukocytes, UA NEGATIVE  NEGATIVE  URINE MICROSCOPIC-ADD ON     Status: Abnormal   Collection Time    03/28/12  9:16 PM      Result Value Range   Squamous Epithelial / LPF RARE  RARE   WBC, UA 0-2  <3 WBC/hpf   RBC / HPF 0-2  <3 RBC/hpf   Bacteria, UA FEW (*) RARE   Crystals CA OXALATE CRYSTALS (*) NEGATIVE  AMNISURE RUPTURE OF MEMBRANE (ROM)     Status: None   Collection Time    03/28/12  9:50 PM      Result Value Range   Amnisure ROM NEGATIVE       Assessment and Plan  27 y/o [id number] here with LOF.  - Negative Amnisure confirms membranes not ruptured- dc home - Continue PRN tylenol for pelvic pain - symmetric IUGR and Suspected chromosomal abnormality- continue current management - Continue to f/u in clinic and with MFM  Kevin Fenton 03/28/2012, 10:32 PM   .I have  seen the patient with the resident/student and agree with the above.  Tawnya Crook

## 2012-03-29 NOTE — MAU Provider Note (Signed)
Attestation of Attending Supervision of Advanced Practitioner: Evaluation and management procedures were performed by the PA/NP/CNM/OB Fellow under my supervision/collaboration. Chart reviewed and agree with management and plan.  Merelyn Klump V 03/29/2012 3:08 AM

## 2012-03-31 ENCOUNTER — Ambulatory Visit (HOSPITAL_COMMUNITY)
Admission: RE | Admit: 2012-03-31 | Discharge: 2012-03-31 | Disposition: A | Discharge status: Home / Self Care | Payer: Medicaid Other | Source: Ambulatory Visit | Attending: Obstetrics & Gynecology | Admitting: Obstetrics & Gynecology

## 2012-03-31 ENCOUNTER — Encounter: Payer: Self-pay | Admitting: Obstetrics and Gynecology

## 2012-03-31 ENCOUNTER — Ambulatory Visit (INDEPENDENT_AMBULATORY_CARE_PROVIDER_SITE_OTHER): Payer: Medicaid Other | Admitting: Obstetrics and Gynecology

## 2012-03-31 ENCOUNTER — Other Ambulatory Visit: Payer: Self-pay | Admitting: Obstetrics & Gynecology

## 2012-03-31 VITALS — BP 109/68 | Wt 205.0 lb

## 2012-03-31 DIAGNOSIS — O365990 Maternal care for other known or suspected poor fetal growth, unspecified trimester, not applicable or unspecified: Secondary | ICD-10-CM

## 2012-03-31 DIAGNOSIS — O3510X0 Maternal care for (suspected) chromosomal abnormality in fetus, unspecified, not applicable or unspecified: Secondary | ICD-10-CM | POA: Insufficient documentation

## 2012-03-31 DIAGNOSIS — O365931 Maternal care for other known or suspected poor fetal growth, third trimester, fetus 1: Secondary | ICD-10-CM

## 2012-03-31 DIAGNOSIS — O358XX0 Maternal care for other (suspected) fetal abnormality and damage, not applicable or unspecified: Secondary | ICD-10-CM | POA: Insufficient documentation

## 2012-03-31 DIAGNOSIS — O99330 Smoking (tobacco) complicating pregnancy, unspecified trimester: Secondary | ICD-10-CM | POA: Insufficient documentation

## 2012-03-31 NOTE — Patient Instructions (Signed)
Breastfeeding Deciding to breastfeed is one of the best choices you can make for you and your baby. The information that follows gives a brief overview of the benefits of breastfeeding as well as common topics surrounding breastfeeding. BENEFITS OF BREASTFEEDING For the baby  The first milk (colostrum) helps the baby's digestive system function better.   There are antibodies in the mother's milk that help the baby fight off infections.   The baby has a lower incidence of asthma, allergies, and sudden infant death syndrome (SIDS).   The nutrients in breast milk are better for the baby than infant formulas, and breast milk helps the baby's brain grow better.   Babies who breastfeed have less gas, colic, and constipation.  For the mother  Breastfeeding helps develop a very special bond between the mother and her baby.   Breastfeeding is convenient, always available at the correct temperature, and costs nothing.   Breastfeeding burns calories in the mother and helps her lose weight that was gained during pregnancy.   Breastfeeding makes the uterus contract back down to normal size faster and slows bleeding following delivery.   Breastfeeding mothers have a lower risk of developing breast cancer.  BREASTFEEDING FREQUENCY  A healthy, full-term baby may breastfeed as often as every hour or space his or her feedings to every 3 hours.   Watch your baby for signs of hunger. Nurse your baby if he or she shows signs of hunger. How often you nurse will vary from baby to baby.   Nurse as often as the baby requests, or when you feel the need to reduce the fullness of your breasts.   Awaken the baby if it has been 3 4 hours since the last feeding.   Frequent feeding will help the mother make more milk and will help prevent problems, such as sore nipples and engorgement of the breasts.  BABY'S POSITION AT THE BREAST  Whether lying down or sitting, be sure that the baby's tummy is  facing your tummy.   Support the breast with 4 fingers underneath the breast and the thumb above. Make sure your fingers are well away from the nipple and baby's mouth.   Stroke the baby's lips gently with your finger or nipple.   When the baby's mouth is open wide enough, place all of your nipple and as much of the areola as possible into your baby's mouth.   Pull the baby in close so the tip of the nose and the baby's cheeks touch the breast during the feeding.  FEEDINGS AND SUCTION  The length of each feeding varies from baby to baby and from feeding to feeding.   The baby must suck about 2 3 minutes for your milk to get to him or her. This is called a "let down." For this reason, allow the baby to feed on each breast as long as he or she wants. Your baby will end the feeding when he or she has received the right balance of nutrients.   To break the suction, put your finger into the corner of the baby's mouth and slide it between his or her gums before removing your breast from his or her mouth. This will help prevent sore nipples.  HOW TO TELL WHETHER YOUR BABY IS GETTING ENOUGH BREAST MILK. Wondering whether or not your baby is getting enough milk is a common concern among mothers. You can be assured that your baby is getting enough milk if:   Your baby is actively   sucking and you hear swallowing.   Your baby seems relaxed and satisfied after a feeding.   Your baby nurses at least 8 12 times in a 24 hour time period. Nurse your baby until he or she unlatches or falls asleep at the first breast (at least 10 20 minutes), then offer the second side.   Your baby is wetting 5 6 disposable diapers (6 8 cloth diapers) in a 24 hour period by 5 6 days of age.   Your baby is having at least 3 4 stools every 24 hours for the first 6 weeks. The stool should be soft and yellow.   Your baby should gain 4 7 ounces per week after he or she is 4 days old.   Your breasts feel softer  after nursing.  REDUCING BREAST ENGORGEMENT  In the first week after your baby is born, you may experience signs of breast engorgement. When breasts are engorged, they feel heavy, warm, full, and may be tender to the touch. You can reduce engorgement if you:   Nurse frequently, every 2 3 hours. Mothers who breastfeed early and often have fewer problems with engorgement.   Place light ice packs on your breasts for 10 20 minutes between feedings. This reduces swelling. Wrap the ice packs in a lightweight towel to protect your skin. Bags of frozen vegetables work well for this purpose.   Take a warm shower or apply warm, moist heat to your breast for 5 10 minutes just before each feeding. This increases circulation and helps the milk flow.   Gently massage your breast before and during the feeding. Using your finger tips, massage from the chest wall towards your nipple in a circular motion.   Make sure that the baby empties at least one breast at every feeding before switching sides.   Use a breast pump to empty the breasts if your baby is sleepy or not nursing well. You may also want to pump if you are returning to work oryou feel you are getting engorged.   Avoid bottle feeds, pacifiers, or supplemental feedings of water or juice in place of breastfeeding. Breast milk is all the food your baby needs. It is not necessary for your baby to have water or formula. In fact, to help your breasts make more milk, it is best not to give your baby supplemental feedings during the early weeks.   Be sure the baby is latched on and positioned properly while breastfeeding.   Wear a supportive bra, avoiding underwire styles.   Eat a balanced diet with enough fluids.   Rest often, relax, and take your prenatal vitamins to prevent fatigue, stress, and anemia.  If you follow these suggestions, your engorgement should improve in 24 48 hours. If you are still experiencing difficulty, call your  lactation consultant or caregiver.  CARING FOR YOURSELF Take care of your breasts  Bathe or shower daily.   Avoid using soap on your nipples.   Start feedings on your left breast at one feeding and on your right breast at the next feeding.   You will notice an increase in your milk supply 2 5 days after delivery. You may feel some discomfort from engorgement, which makes your breasts very firm and often tender. Engorgement "peaks" out within 24 48 hours. In the meantime, apply warm moist towels to your breasts for 5 10 minutes before feeding. Gentle massage and expression of some milk before feeding will soften your breasts, making it easier for your   baby to latch on.   Wear a well-fitting nursing bra, and air dry your nipples for a 3 4minutes after each feeding.   Only use cotton bra pads.   Only use pure lanolin on your nipples after nursing. You do not need to wash it off before feeding the baby again. Another option is to express a few drops of breast milk and gently massage it into your nipples.  Take care of yourself  Eat well-balanced meals and nutritious snacks.   Drinking milk, fruit juice, and water to satisfy your thirst (about 8 glasses a day).   Get plenty of rest.  Avoid foods that you notice affect the baby in a bad way.  SEEK MEDICAL CARE IF:   You have difficulty with breastfeeding and need help.   You have a hard, red, sore area on your breast that is accompanied by a fever.   Your baby is too sleepy to eat well or is having trouble sleeping.   Your baby is wetting less than 6 diapers a day, by 5 days of age.   Your baby's skin or white part of his or her eyes is more yellow than it was in the hospital.   You feel depressed.  Document Released: 01/20/2005 Document Revised: 07/22/2011 Document Reviewed: 04/20/2011 [employer] Patient Information 2013 [employer].  

## 2012-03-31 NOTE — Progress Notes (Signed)
Patient doing well without any complaints. FM/PTL precautions reviewed. F/U MFM ultrasound today. Due to transportation issues, patient will transfer care to [facility] clinic. Cultures at next visit.

## 2012-03-31 NOTE — Progress Notes (Signed)
Has mfm appointment today.

## 2012-04-01 ENCOUNTER — Inpatient Hospital Stay (HOSPITAL_COMMUNITY): Admission: RE | Admit: 2012-04-01 | Payer: Medicaid Other | Source: Ambulatory Visit

## 2012-04-02 ENCOUNTER — Inpatient Hospital Stay (HOSPITAL_COMMUNITY)
Admission: AD | Admit: 2012-04-02 | Discharge: 2012-04-02 | Disposition: A | Discharge status: Home / Self Care | Payer: Medicaid Other | Source: Ambulatory Visit | Attending: Obstetrics and Gynecology | Admitting: Obstetrics and Gynecology

## 2012-04-02 ENCOUNTER — Encounter (HOSPITAL_COMMUNITY): Payer: Self-pay | Admitting: *Deleted

## 2012-04-02 DIAGNOSIS — O368190 Decreased fetal movements, unspecified trimester, not applicable or unspecified: Secondary | ICD-10-CM | POA: Insufficient documentation

## 2012-04-02 DIAGNOSIS — O4700 False labor before 37 completed weeks of gestation, unspecified trimester: Secondary | ICD-10-CM | POA: Insufficient documentation

## 2012-04-02 HISTORY — DX: Dermatitis, unspecified: L30.9

## 2012-04-02 HISTORY — DX: Depression, unspecified: F32.A

## 2012-04-02 HISTORY — DX: Urinary tract infection, site not specified: N39.0

## 2012-04-02 HISTORY — DX: Major depressive disorder, single episode, unspecified: F32.9

## 2012-04-02 NOTE — MAU Note (Signed)
Decreased fetal movement last couple days. Has been contracting all day,  But not regular.

## 2012-04-08 ENCOUNTER — Other Ambulatory Visit (HOSPITAL_COMMUNITY)
Admission: RE | Admit: 2012-04-08 | Discharge: 2012-04-08 | Disposition: A | Discharge status: Home / Self Care | Payer: Medicaid Other | Source: Ambulatory Visit | Attending: Obstetrics & Gynecology | Admitting: Obstetrics & Gynecology

## 2012-04-08 ENCOUNTER — Other Ambulatory Visit: Payer: Self-pay | Admitting: Obstetrics & Gynecology

## 2012-04-08 ENCOUNTER — Ambulatory Visit (HOSPITAL_COMMUNITY)
Admission: RE | Admit: 2012-04-08 | Discharge: 2012-04-08 | Disposition: A | Discharge status: Home / Self Care | Payer: Medicaid Other | Source: Ambulatory Visit | Attending: Obstetrics & Gynecology | Admitting: Obstetrics & Gynecology

## 2012-04-08 ENCOUNTER — Ambulatory Visit (INDEPENDENT_AMBULATORY_CARE_PROVIDER_SITE_OTHER): Payer: Medicaid Other | Admitting: Obstetrics & Gynecology

## 2012-04-08 VITALS — BP 105/71 | Wt 206.4 lb

## 2012-04-08 DIAGNOSIS — Z113 Encounter for screening for infections with a predominantly sexual mode of transmission: Secondary | ICD-10-CM | POA: Insufficient documentation

## 2012-04-08 DIAGNOSIS — Z3480 Encounter for supervision of other normal pregnancy, unspecified trimester: Secondary | ICD-10-CM

## 2012-04-08 DIAGNOSIS — O359XX0 Maternal care for (suspected) fetal abnormality and damage, unspecified, not applicable or unspecified: Secondary | ICD-10-CM

## 2012-04-08 LAB — POCT URINALYSIS DIP (DEVICE)
Bilirubin Urine: NEGATIVE
Ketones, ur: NEGATIVE mg/dL
Protein, ur: 30 mg/dL — AB
Specific Gravity, Urine: 1.025 (ref 1.005–1.030)

## 2012-04-08 NOTE — Progress Notes (Signed)
No problems.  BPP 8/8 today.  Cervix 3-4/50/-2.  Labor precautions given.  Cultures today.

## 2012-04-08 NOTE — Progress Notes (Signed)
Nutrition note: 1st visit consult Pt has h/o obesity. Pt has gained 18.4# @ [redacted]w[redacted]d, which is slightly > expected. Pt reports eating 4x/d. Pt reports no N&V but does have heartburn. Pt is taking PNV. Pt received verbal & written education on general nutrition during pregnancy. Disc tips to decrease heartburn. Disc importance & benefits of BF with pt & father of the baby. Disc wt gain goals of 11-20# or 0.5#/wk. Pt agrees to continue taking PNV. Pt does not have WIC but plans to apply. Pt wants to breastfeed but father of the baby expressed that he does not want her to breastfeed and was not open to pt doing even a little BF. F/u if referred Blondell Reveal, MS, RD, LDN

## 2012-04-08 NOTE — Progress Notes (Signed)
Pulse-105 

## 2012-04-09 ENCOUNTER — Inpatient Hospital Stay (HOSPITAL_COMMUNITY)
Admission: AD | Admit: 2012-04-09 | Discharge: 2012-04-11 | DRG: 774 | Disposition: A | Discharge status: Home / Self Care | Payer: Medicaid Other | Source: Ambulatory Visit | Attending: Obstetrics & Gynecology | Admitting: Obstetrics & Gynecology

## 2012-04-09 ENCOUNTER — Encounter (HOSPITAL_COMMUNITY): Payer: Self-pay | Admitting: *Deleted

## 2012-04-09 DIAGNOSIS — O351XX0 Maternal care for (suspected) chromosomal abnormality in fetus, not applicable or unspecified: Secondary | ICD-10-CM

## 2012-04-09 DIAGNOSIS — O3510X0 Maternal care for (suspected) chromosomal abnormality in fetus, unspecified, not applicable or unspecified: Secondary | ICD-10-CM | POA: Diagnosis present

## 2012-04-09 LAB — CBC
MCH: 33.1 pg (ref 26.0–34.0)
Platelets: 224 10*3/uL (ref 150–400)
RBC: 4.05 MIL/uL (ref 3.87–5.11)

## 2012-04-09 MED ORDER — DIPHENHYDRAMINE HCL 25 MG PO CAPS: 25.0000 mg | ORAL_CAPSULE | Freq: Four times a day (QID) | ORAL | Status: DC | PRN

## 2012-04-09 MED ORDER — CITRIC ACID-SODIUM CITRATE 334-500 MG/5ML PO SOLN: 30.0000 mL | ORAL | Status: DC | PRN

## 2012-04-09 MED ORDER — OXYCODONE-ACETAMINOPHEN 5-325 MG PO TABS
1.0000 | ORAL_TABLET | ORAL | Status: DC | PRN
  Administered 2012-04-09 – 2012-04-10 (×2): 2 via ORAL
  Filled 2012-04-09 (×2): qty 2

## 2012-04-09 MED ORDER — ACETAMINOPHEN 325 MG PO TABS: 650.0000 mg | ORAL_TABLET | ORAL | Status: DC | PRN

## 2012-04-09 MED ORDER — PNEUMOCOCCAL VAC POLYVALENT 25 MCG/0.5ML IJ INJ
0.5000 mL | INJECTION | INTRAMUSCULAR | Status: AC
  Filled 2012-04-09: qty 0.5

## 2012-04-09 MED ORDER — OXYTOCIN 40 UNITS IN LACTATED RINGERS INFUSION - SIMPLE MED
62.5000 mL/h | INTRAVENOUS | Status: DC
  Administered 2012-04-09: 62.5 mL/h via INTRAVENOUS
  Filled 2012-04-09: qty 1000

## 2012-04-09 MED ORDER — NALBUPHINE SYRINGE 5 MG/0.5 ML
10.0000 mg | INJECTION | INTRAMUSCULAR | Status: DC | PRN
  Administered 2012-04-09: 10 mg via INTRAVENOUS
  Filled 2012-04-09: qty 1

## 2012-04-09 MED ORDER — OXYCODONE-ACETAMINOPHEN 5-325 MG PO TABS
1.0000 | ORAL_TABLET | ORAL | Status: DC | PRN
  Administered 2012-04-09: 1 via ORAL
  Filled 2012-04-09: qty 1

## 2012-04-09 MED ORDER — ONDANSETRON HCL 4 MG PO TABS: 4.0000 mg | ORAL_TABLET | ORAL | Status: DC | PRN

## 2012-04-09 MED ORDER — IBUPROFEN 600 MG PO TABS
600.0000 mg | ORAL_TABLET | Freq: Four times a day (QID) | ORAL | Status: DC | PRN
  Administered 2012-04-09: 600 mg via ORAL
  Filled 2012-04-09: qty 1

## 2012-04-09 MED ORDER — OXYTOCIN BOLUS FROM INFUSION
500.0000 mL | INTRAVENOUS | Status: DC
  Administered 2012-04-09: 500 mL via INTRAVENOUS

## 2012-04-09 MED ORDER — ZOLPIDEM TARTRATE 5 MG PO TABS: 5.0000 mg | ORAL_TABLET | Freq: Every evening | ORAL | Status: DC | PRN

## 2012-04-09 MED ORDER — BUTORPHANOL TARTRATE 1 MG/ML IJ SOLN
1.0000 mg | Freq: Once | INTRAMUSCULAR | Status: AC
  Administered 2012-04-09: 1 mg via INTRAVENOUS
  Filled 2012-04-09: qty 1

## 2012-04-09 MED ORDER — ONDANSETRON HCL 4 MG/2ML IJ SOLN: 4.0000 mg | INTRAMUSCULAR | Status: DC | PRN

## 2012-04-09 MED ORDER — LACTATED RINGERS IV SOLN: 500.0000 mL | INTRAVENOUS | Status: DC | PRN

## 2012-04-09 MED ORDER — SIMETHICONE 80 MG PO CHEW
80.0000 mg | CHEWABLE_TABLET | ORAL | Status: DC | PRN
  Administered 2012-04-10: 80 mg via ORAL

## 2012-04-09 MED ORDER — MISOPROSTOL 200 MCG PO TABS
ORAL_TABLET | ORAL | Status: AC
  Administered 2012-04-09: 800 ug via RECTAL
  Filled 2012-04-09: qty 4

## 2012-04-09 MED ORDER — LANOLIN HYDROUS EX OINT: TOPICAL_OINTMENT | CUTANEOUS | Status: DC | PRN

## 2012-04-09 MED ORDER — DIBUCAINE 1 % RE OINT
1.0000 "application " | TOPICAL_OINTMENT | RECTAL | Status: DC | PRN
  Filled 2012-04-09: qty 28

## 2012-04-09 MED ORDER — ONDANSETRON HCL 4 MG/2ML IJ SOLN: 4.0000 mg | Freq: Four times a day (QID) | INTRAMUSCULAR | Status: DC | PRN

## 2012-04-09 MED ORDER — WITCH HAZEL-GLYCERIN EX PADS: 1.0000 "application " | MEDICATED_PAD | CUTANEOUS | Status: DC | PRN

## 2012-04-09 MED ORDER — MISOPROSTOL 200 MCG PO TABS: 800.0000 ug | ORAL_TABLET | Freq: Once | ORAL | Status: AC

## 2012-04-09 MED ORDER — TETANUS-DIPHTH-ACELL PERTUSSIS 5-2.5-18.5 LF-MCG/0.5 IM SUSP: 0.5000 mL | Freq: Once | INTRAMUSCULAR | Status: DC

## 2012-04-09 MED ORDER — NALOXONE HCL 0.4 MG/ML IJ SOLN
INTRAMUSCULAR | Status: AC
  Filled 2012-04-09: qty 1

## 2012-04-09 MED ORDER — LACTATED RINGERS IV SOLN
INTRAVENOUS | Status: DC
  Administered 2012-04-09: 08:00:00 via INTRAVENOUS

## 2012-04-09 MED ORDER — PRENATAL MULTIVITAMIN CH
1.0000 | ORAL_TABLET | Freq: Every day | ORAL | Status: DC
  Administered 2012-04-09 – 2012-04-11 (×3): 1 via ORAL
  Filled 2012-04-09 (×3): qty 1

## 2012-04-09 MED ORDER — BENZOCAINE-MENTHOL 20-0.5 % EX AERO
1.0000 "application " | INHALATION_SPRAY | CUTANEOUS | Status: DC | PRN
  Filled 2012-04-09: qty 56

## 2012-04-09 MED ORDER — IBUPROFEN 600 MG PO TABS
600.0000 mg | ORAL_TABLET | Freq: Four times a day (QID) | ORAL | Status: DC
  Administered 2012-04-09 – 2012-04-11 (×9): 600 mg via ORAL
  Filled 2012-04-09 (×9): qty 1

## 2012-04-09 MED ORDER — SENNOSIDES-DOCUSATE SODIUM 8.6-50 MG PO TABS
2.0000 | ORAL_TABLET | Freq: Every day | ORAL | Status: DC
  Administered 2012-04-09 – 2012-04-10 (×2): 2 via ORAL

## 2012-04-09 MED ORDER — SODIUM CHLORIDE 0.9 % IV SOLN
2.0000 g | Freq: Once | INTRAVENOUS | Status: AC
  Administered 2012-04-09: 2 g via INTRAVENOUS
  Filled 2012-04-09: qty 2000

## 2012-04-09 MED ORDER — LIDOCAINE HCL (PF) 1 % IJ SOLN
30.0000 mL | INTRAMUSCULAR | Status: DC | PRN
  Filled 2012-04-09: qty 30

## 2012-04-09 NOTE — Progress Notes (Signed)
UR completed 

## 2012-04-09 NOTE — Progress Notes (Signed)
CAYTLIN BETTER is a 27 y.o. [phone] at [redacted]w[redacted]d by LMP admitted for active labor  Subjective: Pt mentions continued contractions that are steady with release of fluid prior to exam.   Pt feels baby moving, no complaints  Objective: BP 123/67  Pulse 89  Temp(Src) 98.2 F (36.8 C) (Oral)  Resp 18  Ht 5\' 3"  (1.6 m)  Wt 93.895 kg (207 lb)  BMI 36.68 kg/m2  LMP 08/02/2011      FHT:  FHR: 130 bpm, variability: moderate,  accelerations:  Present,  decelerations:  Absent UC:   regular, every 4-5 minutes SVE:   Dilation: 8 Effacement (%): 90 Station: 0 Exam by:: Dr. Anastasia Fiedler  Labs: Lab Results  Component Value Date   WBC 11.1* 04/09/2012   HGB 13.4 04/09/2012   HCT 38.9 04/09/2012   MCV 96.0 04/09/2012   PLT 224 04/09/2012    Assessment / Plan: Spontaneous labor, progressing normally, continue to ambulate as tolerated  Labor: Progressing normally Fetal Wellbeing:  Category I Pain Control:  Labor support without medications I/D:  n/a Anticipated MOD:  NSVD  Gregor Hams 04/09/2012, 11:13 AM

## 2012-04-09 NOTE — H&P (Signed)
Alicia Pearson is a 27 y.o. female presenting for labor evaluation/contractions.  She reports good fetal movement, denies LOF, vaginal bleeding, vaginal itching/burning, urinary symptoms, h/a, dizziness, n/v, or fever/chills.    Maternal Medical History:  Reason for admission: Contractions.  Nausea.  Contractions: Onset was yesterday.   Frequency: regular.   Duration is approximately 1 minute.   Perceived severity is strong.    Fetal activity: Perceived fetal activity is normal.   Last perceived fetal movement was within the past hour.    Prenatal complications: Positive Harmony test for Downs  Prenatal Complications - Diabetes: none.    OB History   Grav Para Term Preterm Abortions TAB SAB Ect Mult Living   4 3 3      1 4      Past Medical History  Diagnosis Date  . Tachycardia     history, not on meds  . Urinary tract infection   . Eczema   . Depression     no meds, doing ok   Past Surgical History  Procedure Laterality Date  . Wisdom tooth extraction     Family History: family history includes Alcohol abuse in her brother and father; Alzheimer's disease in her maternal grandmother; Aneurysm in her mother; Cancer in her mother; and Hypertension in her mother. Social History:  reports that she has been smoking Cigarettes.  She has a 2 pack-year smoking history. She has never used smokeless tobacco. She reports that she does not drink alcohol or use illicit drugs.   Prenatal Transfer Tool  Maternal Diabetes: No Genetic Screening: Abnormal:  Results: Trisomy 21, Other: Maternal Ultrasounds/Referrals: Normal Fetal Ultrasounds or other Referrals:  None Maternal Substance Abuse:  Yes:  Type: Smoker Significant Maternal Medications:  None Significant Maternal Lab Results:  Lab values include: Other:  Other Comments:  Harmony test postitive, GBS Unknown--PCR collected 04/09/12  Review of Systems  Constitutional: Negative for fever, chills and malaise/fatigue.   Eyes: Negative for blurred vision.  Respiratory: Negative for cough and shortness of breath.   Cardiovascular: Negative for chest pain.  Gastrointestinal: Positive for abdominal pain. Negative for heartburn, nausea and vomiting.  Genitourinary: Negative for dysuria, urgency and frequency.  Musculoskeletal: Negative.   Neurological: Negative for dizziness and headaches.  Psychiatric/Behavioral: Negative for depression.    Dilation: 7 Effacement (%): 80 Station: -2 Exam by:: DCALLAWAY, RN Blood pressure 105/71, pulse 106, temperature 97.9 F (36.6 C), temperature source Oral, resp. rate 20, height 5\' 3"  (1.6 m), weight 96.616 kg (213 lb), last menstrual period 08/02/2011. Maternal Exam:  Uterine Assessment: Contraction strength is moderate.  Contraction duration is 70 seconds. Contraction frequency is regular.   Abdomen: Patient reports no abdominal tenderness. Estimated fetal weight is 35% by 35 week sono .   Fetal presentation: vertex  Cervix: Cervix evaluated by digital exam.     Fetal Exam Fetal Monitor Review: Mode: ultrasound.   Baseline rate: 130.  Variability: moderate (6-25 bpm).   Pattern: accelerations present and variable decelerations.    Fetal State Assessment: Category II - tracings are indeterminate.     Physical Exam  Nursing note and vitals reviewed. Constitutional: She is oriented to person, place, and time. She appears well-developed and well-nourished.  Neck: Normal range of motion.  Cardiovascular: Normal rate and regular rhythm.   Respiratory: Effort normal and breath sounds normal.  GI: Soft.  Genitourinary:  Cervix 7 cm per RN  Musculoskeletal: Normal range of motion.  Neurological: She is alert and oriented to person, place, and  time. She has normal reflexes.  Skin: Skin is warm and dry.  Psychiatric: She has a normal mood and affect. Her behavior is normal. Judgment and thought content normal.    Prenatal labs: ABO, Rh: B/POS/-- (08/19  1159) Antibody: NEG (08/19 1159) Rubella: 60.0 (08/19 1159) RPR: NON REAC (01/09 1043)  HBsAg: NEGATIVE (08/19 1159)  HIV: NON REACTIVE (01/09 1043)  GBS:   unknown 1 hour glucose screen: 94  Assessment/Plan: Preterm labor at [redacted]w[redacted]d GBS Unknown Positive Harmony test for Trisomy 21  Admit to [facility] Ampicillin 2g IV for GBS prophylaxis GBS PCR collected on admission Anticipate NSVD   LEFTWICH-KIRBY, Alicia Pearson 04/09/2012, 7:15 AM

## 2012-04-09 NOTE — MAU Note (Signed)
PT SAYS SHE STARTED HURTING BAD AT 0356- VE IN OFFICE YESTERDAY- 4 CM.  DENIES HSV AND MRSA.

## 2012-04-09 NOTE — Progress Notes (Signed)
I examined pt and agree with documentation above and resident plan of care. MUHAMMAD,WALIDAH  

## 2012-04-09 NOTE — Progress Notes (Signed)
Alicia Pearson is a 27 y.o. [phone] at [redacted]w[redacted]d by LMP admitted for active labor  Subjective:   Objective: BP 123/67  Pulse 89  Temp(Src) 98.2 F (36.8 C) (Oral)  Resp 18  Ht 5\' 3"  (1.6 m)  Wt 93.895 kg (207 lb)  BMI 36.68 kg/m2  LMP 08/02/2011      FHT:  FHR: 125 bpm, variability: moderate,  accelerations:  Present,  decelerations:  Absent UC:   regular, every 7-11 minutes SVE:   Dilation: 7.5 Effacement (%): 80 Station: -1 Exam by:: Roney Marion, CNM, Mills Koller  Labs: Lab Results  Component Value Date   WBC 11.1* 04/09/2012   HGB 13.4 04/09/2012   HCT 38.9 04/09/2012   MCV 96.0 04/09/2012   PLT 224 04/09/2012    Assessment / Plan: Spontaneous labor, progressing normally, AROM with minimal clear fluid leakage  Labor: Progressing normally Fetal Wellbeing:  Category I Pain Control:  Nubain Anticipated MOD:  NSVD  Gregor Hams 04/09/2012, 9:08 AM

## 2012-04-09 NOTE — Progress Notes (Signed)
I examined pt and agree with documentation above and resident plan of care. MUHAMMAD,Harshan Kearley  

## 2012-04-09 NOTE — H&P (Signed)
Attestation of Attending Supervision of Advanced Practitioner (CNM/NP): Evaluation and management procedures were performed by the Advanced Practitioner under my supervision and collaboration.  I have reviewed the Advanced Practitioner's note and chart, and I agree with the management and plan.  HARRAWAY-SMITH, CAROLYN 9:21 AM     

## 2012-04-10 LAB — CBC
MCH: 32.9 pg (ref 26.0–34.0)
MCV: 95.5 fL (ref 78.0–100.0)
Platelets: 202 10*3/uL (ref 150–400)
RDW: 14.3 % (ref 11.5–15.5)

## 2012-04-10 NOTE — Progress Notes (Signed)
CSW received report that baby has Trisomy 21 from [facility].  CSW met briefly with [name] to inform her of services, since [facility] staff is not here on the weekend.  [name] is extremely interested in receiving all possible services and gave CSW permission to make referral to [facility]. 

## 2012-04-10 NOTE — Progress Notes (Signed)
I have seen and examined this patient and I agree with the above. Alicia Pearson 9:52 AM 04/10/2012

## 2012-04-10 NOTE — Progress Notes (Signed)
[id number] on Post Partum Day 1 s/p SVD Subjective: no complaints, up ad lib and voiding.  Breastfeeding going well Cramping relieved with Percocet  Objective: Blood pressure 116/81, pulse 73, temperature 97.4 F (36.3 C), temperature source Oral, resp. rate 18, height 5\' 3"  (1.6 m), weight 93.895 kg (207 lb), last menstrual period 08/02/2011, unknown if currently breastfeeding.  Physical Exam:  General: alert, cooperative and no distress Lochia: appropriate Uterine Fundus: firm DVT Evaluation: No evidence of DVT seen on physical exam. Negative Homan's sign. No cords or calf tenderness. No significant calf/ankle edema.   Recent Labs  04/09/12 0738 04/10/12 0506  HGB 13.4 11.8*  HCT 38.9 34.3*    Assessment/Plan: Plan for discharge tomorrow, Breastfeeding and Contraception : unsure, considering Depo/Nexplanon/IUD   LOS: 1 day   Alicia Pearson 04/10/2012, 7:23 AM

## 2012-04-11 LAB — RPR: RPR Ser Ql: NONREACTIVE

## 2012-04-11 LAB — CULTURE, BETA STREP (GROUP B ONLY)

## 2012-04-11 MED ORDER — IBUPROFEN 600 MG PO TABS: 600.0000 mg | ORAL_TABLET | Freq: Four times a day (QID) | ORAL | Status: DC

## 2012-04-11 NOTE — Discharge Summary (Signed)
Obstetric Discharge Summary Reason for Admission: onset of labor Prenatal Procedures: NST and ultrasound Intrapartum Procedures: spontaneous vaginal delivery Postpartum Procedures: none Complications-Operative and Postpartum: hemorrhage treated with cytotec; hgb remained stable. Hemoglobin  Date Value Range Status  04/10/2012 11.8* 12.0 - 15.0 g/dL Final     HCT  Date Value Range Status  04/10/2012 34.3* 36.0 - 46.0 % Final    Physical Exam:  General: alert, cooperative and appears stated age Lochia: appropriate Uterine Fundus: firm Incision: n/a DVT Evaluation: No evidence of DVT seen on physical exam. Negative Homan's sign.  Discharge Diagnoses: Term Pregnancy-delivered and Postpartum Hemorrhage  Discharge Information: Date: 04/11/2012 Activity: pelvic rest Diet: routine Medications: Ibuprofen Condition: stable Instructions: refer to practice specific booklet Discharge to: home Follow-up Information   Follow up with [facility] In 4 weeks.   Contact information:   [address] [place] [address]       Newborn Data: Live born female  Birth Weight: 6 lb 1.4 oz (2761 g) APGAR: 5, 7  Home with mother.  [facility] 04/11/2012, 7:48 AM

## 2012-04-12 ENCOUNTER — Encounter: Payer: Self-pay | Admitting: Obstetrics & Gynecology

## 2012-04-12 NOTE — Discharge Summary (Signed)
Attestation of Attending Supervision of Advanced Practitioner (CNM/NP): Evaluation and management procedures were performed by the Advanced Practitioner under my supervision and collaboration.  I have reviewed the Advanced Practitioner's note and chart, and I agree with the management and plan.  Eleasha Cataldo 04/12/2012 1:13 PM

## 2012-04-13 LAB — TYPE AND SCREEN
ABO/RH(D): B POS
Antibody Screen: POSITIVE
DAT, IgG: NEGATIVE
Unit division: 0

## 2012-04-15 ENCOUNTER — Ambulatory Visit (HOSPITAL_COMMUNITY): Payer: Medicaid Other

## 2012-04-15 ENCOUNTER — Encounter: Payer: Medicaid Other | Admitting: Obstetrics & Gynecology

## 2012-04-19 ENCOUNTER — Ambulatory Visit (HOSPITAL_COMMUNITY)
Admit: 2012-04-19 | Discharge: 2012-04-19 | Disposition: A | Discharge status: Home / Self Care | Payer: Medicaid Other | Attending: Obstetrics & Gynecology | Admitting: Obstetrics & Gynecology

## 2012-04-19 NOTE — Lactation Note (Addendum)
Infant Lactation Consultation Outpatient Visit Note  Mom has been exclusively pumping and bottle feeding.  She desires to have [place] feed from the breast.  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  Muscle tone seems appropriate.  Mom has a great milk supply.  Kathryne Hitch would not latch to the bare breast.  Applied a #20NS and latched.  She transferred 22 ml.  Shield seemed a little large so a #16 was applied.  Patient Name: MARIELYS TRINIDAD Date of Birth: [birth year]  04/09/2012 Birth Weight:  6#1oz Gestational Age at Delivery: Gestational Age: <None> Type of Delivery: Vag  Breastfeeding History Frequency of Breastfeeding:  Length of Feeding: 2- 3 oz 10 times a day Voids: 6+ Stools: 6-8  Supplementing / Method: Pumping:Pumping into bottles  Type of Pump:Medela manual   Frequency:every 2-3 hours  Volume:  2-4 oz  Comments:  Recently supply ahs decreased from 4 oz -2 oz  Consultation Evaluation:  Initial Feeding Assessment: Pre-feed [id number] Post-feed Weight:2780 Amount Transferred:22 Comments:  Additional Feeding Assessment: Pre-feed Weight:2780 Post-feed [id number]:1478 Amount Transferred:4 Comments:  :  Total Breast milk Transferred this Visit: 26 Total Supplement Given:   Additional Interventions:   Follow-Up   Wednesday 3/26 at 9:00am   Soyla Dryer 04/19/2012, 1:07 PM

## 2012-04-28 ENCOUNTER — Ambulatory Visit (HOSPITAL_COMMUNITY): Admission: RE | Admit: 2012-04-28 | Payer: Medicaid Other | Source: Ambulatory Visit

## 2012-06-10 ENCOUNTER — Encounter: Payer: Self-pay | Admitting: Obstetrics & Gynecology

## 2012-06-10 ENCOUNTER — Ambulatory Visit (INDEPENDENT_AMBULATORY_CARE_PROVIDER_SITE_OTHER): Payer: Medicaid Other | Admitting: Obstetrics & Gynecology

## 2012-06-10 DIAGNOSIS — F329 Major depressive disorder, single episode, unspecified: Secondary | ICD-10-CM

## 2012-06-10 DIAGNOSIS — O99345 Other mental disorders complicating the puerperium: Secondary | ICD-10-CM

## 2012-06-10 MED ORDER — CITALOPRAM HYDROBROMIDE 20 MG PO TABS: 20.0000 mg | ORAL_TABLET | Freq: Every day | ORAL | Status: DC

## 2012-06-10 NOTE — Progress Notes (Signed)
  Subjective:    Patient ID: Alicia Pearson, female    DOB: 24-Feb-1985, 27 y.o.   MRN: [id number]  HPI  Ms. Horn is a 27 yo S AA P5 who is here for her pp exam. She had an uncomplicated 36.[redacted] week EGA NSVD. She scored 16 on the depression scale. She denies HI but does have some thoughts of suicide but says that she would never hurt herself because she needs to be here for her kids. She has a boyfriend who is the father of the latest child but other fathers for the other kids. She does not want more kids but the current FOB "wants 10". She says that she had pp depression after her last child in 2007 and that she refused treatment at that time. She says that it spontaneously resolved. She is willing to take a medication for depression at this time. She reports normal bowel and bladder function. She denies dyspareunia. She is having unprotected sex. She is aware how babies are made.   Review of Systems Pap 8/13 normal    Objective:   Physical Exam  Normal perineum, vagina, cervix Involuted uterus and normal adnexal exam      Assessment & Plan:  PP- stable Contraception- I have stressed the importance of condoms and have given her info on Mirena.  PP depresssion- I will start celexa 20 mg daily and refer her to a psychiatrist. Social worker will see her today. She has promised me that if she is feeling suicidal, she will go to [facility]. RTC 3 weeks/prn sooner

## 2012-06-16 ENCOUNTER — Telehealth: Payer: Self-pay | Admitting: *Deleted

## 2012-06-16 DIAGNOSIS — N760 Acute vaginitis: Secondary | ICD-10-CM

## 2012-06-16 DIAGNOSIS — N898 Other specified noninflammatory disorders of vagina: Secondary | ICD-10-CM

## 2012-06-16 MED ORDER — METRONIDAZOLE 500 MG PO TABS: 500.0000 mg | ORAL_TABLET | Freq: Two times a day (BID) | ORAL | Status: DC

## 2012-06-16 NOTE — Telephone Encounter (Signed)
Alicia Pearson  Called and left a message requesting a call back. Called her and she c/o a brownish vaginal discharge with a bad odor. States she has had BV before and thinks it is that again, last treated about a year or more ago. Requests treatment. We discussed had postpartum visit recently and no testing done then.  Informed her I could send in a prescription for flagyl, but if the discharge and odor do not go away she will need to be seen and evaluated as it could be something else. Patient states she has an appointment scheduled for end of month and will keep that appointment if discharge and odor have not gone away.

## 2012-07-01 ENCOUNTER — Ambulatory Visit: Payer: Medicaid Other | Admitting: Obstetrics & Gynecology

## 2012-09-09 ENCOUNTER — Emergency Department (HOSPITAL_COMMUNITY)
Admission: EM | Admit: 2012-09-09 | Discharge: 2012-09-10 | Disposition: A | Discharge status: Home / Self Care | Payer: Self-pay | Attending: Emergency Medicine | Admitting: Emergency Medicine

## 2012-09-09 DIAGNOSIS — L258 Unspecified contact dermatitis due to other agents: Secondary | ICD-10-CM | POA: Insufficient documentation

## 2012-09-09 DIAGNOSIS — L299 Pruritus, unspecified: Secondary | ICD-10-CM | POA: Insufficient documentation

## 2012-09-09 DIAGNOSIS — Z872 Personal history of diseases of the skin and subcutaneous tissue: Secondary | ICD-10-CM | POA: Insufficient documentation

## 2012-09-09 DIAGNOSIS — Z87448 Personal history of other diseases of urinary system: Secondary | ICD-10-CM | POA: Insufficient documentation

## 2012-09-09 DIAGNOSIS — F17200 Nicotine dependence, unspecified, uncomplicated: Secondary | ICD-10-CM | POA: Insufficient documentation

## 2012-09-09 DIAGNOSIS — Z8659 Personal history of other mental and behavioral disorders: Secondary | ICD-10-CM | POA: Insufficient documentation

## 2012-09-09 DIAGNOSIS — Z8679 Personal history of other diseases of the circulatory system: Secondary | ICD-10-CM | POA: Insufficient documentation

## 2012-09-09 DIAGNOSIS — L247 Irritant contact dermatitis due to plants, except food: Secondary | ICD-10-CM

## 2012-09-09 NOTE — ED Notes (Signed)
Pt c/o poison ivy "all over" . Pt has small itchy bumps all over. Pt has used calamine lotion, but states it is not working anymore. Pt last had Benadryl at 1500 today. Pt with no acute distress. Pt ambulatory to exam room with steady gait.

## 2012-09-10 MED ORDER — PREDNISONE 20 MG PO TABS: 40.0000 mg | ORAL_TABLET | Freq: Every day | ORAL | Status: DC

## 2012-09-10 NOTE — ED Provider Notes (Signed)
Medical screening examination/treatment/procedure(s) were performed by non-physician practitioner and as supervising physician I was immediately available for consultation/collaboration.  Elleni Mozingo M Cinzia Devos, MD 09/10/12 0650 

## 2012-09-10 NOTE — ED Provider Notes (Signed)
CSN: [id number]     Arrival date & time 09/09/12  2326 History     First MD Initiated Contact with Patient 09/09/12 2330     Chief Complaint  Patient presents with  . Rash   (Consider location/radiation/quality/duration/timing/severity/associated sxs/prior Treatment) HPI Pt is a 27yo female c/o poison ivy "all over" for the past 2 days.  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  Rash started on arms and has spread to her chest neck and face.  Has tried benadryl and calamine lotion w/o relief.  Denies difficulty breathing or swallowing.  Denies eye pain or itching.  Has never had to be treated by medical professional in past for contact dermatitis.  Denies fever, n/v/d.      Past Medical History  Diagnosis Date  . Tachycardia     history, not on meds  . Urinary tract infection   . Eczema   . Depression     no meds, doing ok   Past Surgical History  Procedure Laterality Date  . Wisdom tooth extraction     Family History  Problem Relation Age of Onset  . Cancer Mother     breast 2003  . Hypertension Mother   . Aneurysm Mother     x2  . Alcohol abuse Father   . Alcohol abuse Brother   . Alzheimer's disease Maternal Grandmother    History  Substance Use Topics  . Smoking status: Current Every Day Smoker -- 0.25 packs/day for 8 years    Types: Cigarettes  . Smokeless tobacco: Never Used  . Alcohol Use: No   OB History   Grav Para Term Preterm Abortions TAB SAB Ect Mult Living   4 4 3 1     1 5      Review of Systems  Constitutional: Negative for fever.  HENT: Negative for sore throat.   Eyes: Negative for photophobia, pain, discharge, redness, itching and visual disturbance.  Respiratory: Negative for shortness of breath and wheezing.   Skin: Positive for rash.  All other systems reviewed and are negative.    Allergies  Review of patient's allergies indicates no known  allergies.  Home Medications   Current Outpatient Rx  Name  Route  Sig  Dispense  Refill  . diphenhydrAMINE (BENADRYL) 25 mg capsule   Oral   Take 25 mg by mouth once.         . predniSONE (DELTASONE) 20 MG tablet   Oral   Take 2 tablets (40 mg total) by mouth daily. Take 40 mg by mouth daily for 3 days, then 20mg  by mouth daily for 3 days, then 10mg  daily for 3 days   12 tablet   0    BP 112/79  Pulse 84  Temp(Src) 98.7 F (37.1 C) (Oral)  Resp 17  SpO2 100% Physical Exam  Nursing note and vitals reviewed. Constitutional: She is oriented to person, place, and time. She appears well-developed and well-nourished.  HENT:  Head: Normocephalic and atraumatic.  Eyes: EOM are normal.  Neck: Normal range of motion.  Cardiovascular: Normal rate, regular rhythm and normal heart sounds.   Pulmonary/Chest: Effort normal and breath sounds normal. No respiratory distress. She has no wheezes. She has no rales. She exhibits no tenderness.  Musculoskeletal: Normal range of motion.  Neurological: She is alert and oriented to person, place, and time.  Skin: Skin is warm and dry. Rash  noted.  Diffuse maculopapular rash with vesicles and pustules on face, neck, chest, and arms.  Some with excoriation and drainage of scant clear fluid.   Psychiatric: She has a normal mood and affect. Her behavior is normal.    ED Course   Procedures (including critical care time)  Labs Reviewed - No data to display No results found. 1. Contact dermatitis and eczema due to plant     MDM  Rx: prednisone taper. Will discharge pt home and have her f/u with [facility] info provided. Return precautions given. Pt verbalized understanding and agreement with tx plan. Vitals: unremarkable. Discharged in stable condition.       Junius Finner, PA-C 09/10/12 [phone]

## 2012-12-09 ENCOUNTER — Other Ambulatory Visit: Payer: Self-pay

## 2013-01-12 ENCOUNTER — Ambulatory Visit (INDEPENDENT_AMBULATORY_CARE_PROVIDER_SITE_OTHER): Payer: Medicaid Other | Admitting: *Deleted

## 2013-01-12 ENCOUNTER — Encounter: Payer: Self-pay | Admitting: *Deleted

## 2013-01-12 VITALS — BP 97/67 | Wt 199.0 lb

## 2013-01-12 DIAGNOSIS — Z3400 Encounter for supervision of normal first pregnancy, unspecified trimester: Secondary | ICD-10-CM

## 2013-01-12 DIAGNOSIS — Z3401 Encounter for supervision of normal first pregnancy, first trimester: Secondary | ICD-10-CM

## 2013-01-12 LAB — POCT URINE PREGNANCY: Preg Test, Ur: POSITIVE

## 2013-01-12 NOTE — Progress Notes (Signed)
P-84  

## 2013-01-12 NOTE — Addendum Note (Signed)
Addended by: Tandy Gaw C on: 01/12/2013 02:41 PM   Modules accepted: Orders

## 2013-01-13 ENCOUNTER — Ambulatory Visit (INDEPENDENT_AMBULATORY_CARE_PROVIDER_SITE_OTHER): Payer: Medicaid Other | Admitting: Obstetrics & Gynecology

## 2013-01-13 ENCOUNTER — Encounter: Payer: Self-pay | Admitting: Obstetrics & Gynecology

## 2013-01-13 VITALS — BP 116/77 | Wt 198.8 lb

## 2013-01-13 DIAGNOSIS — O9989 Other specified diseases and conditions complicating pregnancy, childbirth and the puerperium: Secondary | ICD-10-CM

## 2013-01-13 DIAGNOSIS — Z3480 Encounter for supervision of other normal pregnancy, unspecified trimester: Secondary | ICD-10-CM

## 2013-01-13 DIAGNOSIS — N760 Acute vaginitis: Secondary | ICD-10-CM

## 2013-01-13 DIAGNOSIS — T7411XA Adult physical abuse, confirmed, initial encounter: Secondary | ICD-10-CM

## 2013-01-13 DIAGNOSIS — N898 Other specified noninflammatory disorders of vagina: Secondary | ICD-10-CM

## 2013-01-13 DIAGNOSIS — O09899 Supervision of other high risk pregnancies, unspecified trimester: Secondary | ICD-10-CM | POA: Insufficient documentation

## 2013-01-13 DIAGNOSIS — A499 Bacterial infection, unspecified: Secondary | ICD-10-CM

## 2013-01-13 DIAGNOSIS — Z91410 Personal history of adult physical and sexual abuse: Secondary | ICD-10-CM | POA: Insufficient documentation

## 2013-01-13 DIAGNOSIS — O9A319 Physical abuse complicating pregnancy, unspecified trimester: Secondary | ICD-10-CM

## 2013-01-13 DIAGNOSIS — Z3491 Encounter for supervision of normal pregnancy, unspecified, first trimester: Secondary | ICD-10-CM

## 2013-01-13 DIAGNOSIS — O09891 Supervision of other high risk pregnancies, first trimester: Secondary | ICD-10-CM

## 2013-01-13 DIAGNOSIS — Z8279 Family history of other congenital malformations, deformations and chromosomal abnormalities: Secondary | ICD-10-CM | POA: Insufficient documentation

## 2013-01-13 DIAGNOSIS — B9689 Other specified bacterial agents as the cause of diseases classified elsewhere: Secondary | ICD-10-CM

## 2013-01-13 DIAGNOSIS — Z3490 Encounter for supervision of normal pregnancy, unspecified, unspecified trimester: Secondary | ICD-10-CM | POA: Insufficient documentation

## 2013-01-13 LAB — GC/CHLAMYDIA PROBE AMP, URINE: GC Probe Amp, Urine: NEGATIVE

## 2013-01-13 NOTE — Progress Notes (Signed)
P-80  Informal bedside ultrasound shows a crown rump length measuring 8wks and 6 days.

## 2013-01-13 NOTE — Patient Instructions (Signed)
Pregnancy - First Trimester  During sexual intercourse, millions of sperm go into the vagina. Only 1 sperm will penetrate and fertilize the female egg while it is in the Fallopian tube. One week later, the fertilized egg implants into the wall of the uterus. An embryo begins to develop into a baby. At 6 to 8 weeks, the eyes and face are formed and the heartbeat can be seen on ultrasound. At the end of 12 weeks (first trimester), all the baby's organs are formed. Now that you are pregnant, you will want to do everything you can to have a healthy baby. Two of the most important things are to get good prenatal care and follow your caregiver's instructions. Prenatal care is all the medical care you receive before the baby's birth. It is given to prevent, find, and treat problems during the pregnancy and childbirth.  PRENATAL EXAMS  · During prenatal visits, your weight, blood pressure, and urine are checked. This is done to make sure you are healthy and progressing normally during the pregnancy.  · A pregnant woman should gain 25 to 35 pounds during the pregnancy. However, if you are overweight or underweight, your caregiver will advise you regarding your weight.  · Your caregiver will ask and answer questions for you.  · Blood work, cervical cultures, other necessary tests, and a Pap test are done during your prenatal exams. These tests are done to check on your health and the probable health of your baby. Tests are strongly recommended and done for HIV with your permission. This is the virus that causes AIDS. These tests are done because medicines can be given to help prevent your baby from being born with this infection should you have been infected without knowing it. Blood work is also used to find out your blood type, previous infections, and follow your blood levels (hemoglobin).  · Low hemoglobin (anemia) is common during pregnancy. Iron and vitamins are given to help prevent this. Later in the pregnancy, blood  tests for diabetes will be done along with any other tests if any problems develop.  · You may need other tests to make sure you and the baby are doing well.  CHANGES DURING THE FIRST TRIMESTER   Your body goes through many changes during pregnancy. They vary from person to person. Talk to your caregiver about changes you notice and are concerned about. Changes can include:  · Your menstrual period stops.  · The egg and sperm carry the genes that determine what you look like. Genes from you and your partner are forming a baby. The female genes determine whether the baby is a boy or a girl.  · Your body increases in girth and you may feel bloated.  · Feeling sick to your stomach (nauseous) and throwing up (vomiting). If the vomiting is uncontrollable, call your caregiver.  · Your breasts will begin to enlarge and become tender.  · Your nipples may stick out more and become darker.  · The need to urinate more. Painful urination may mean you have a bladder infection.  · Tiring easily.  · Loss of appetite.  · Cravings for certain kinds of food.  · At first, you may gain or lose a couple of pounds.  · You may have changes in your emotions from day to day (excited to be pregnant or concerned something may go wrong with the pregnancy and baby).  · You may have more vivid and strange dreams.  HOME CARE INSTRUCTIONS   ·   It is very important to avoid all smoking, alcohol and non-prescribed drugs during your pregnancy. These affect the formation and growth of the baby. Avoid chemicals while pregnant to ensure the delivery of a healthy infant.  · Start your prenatal visits by the 12th week of pregnancy. They are usually scheduled monthly at first, then more often in the last 2 months before delivery. Keep your caregiver's appointments. Follow your caregiver's instructions regarding medicine use, blood and lab tests, exercise, and diet.  · During pregnancy, you are providing food for you and your baby. Eat regular, well-balanced  meals. Choose foods such as meat, fish, milk and other low fat dairy products, vegetables, fruits, and whole-grain breads and cereals. Your caregiver will tell you of the ideal weight gain.  · You can help morning sickness by keeping soda crackers at the bedside. Eat a couple before arising in the morning. You may want to use the crackers without salt on them.  · Eating 4 to 5 small meals rather than 3 large meals a day also may help the nausea and vomiting.  · Drinking liquids between meals instead of during meals also seems to help nausea and vomiting.  · A physical sexual relationship may be continued throughout pregnancy if there are no other problems. Problems may be early (premature) leaking of amniotic fluid from the membranes, vaginal bleeding, or belly (abdominal) pain.  · Exercise regularly if there are no restrictions. Check with your caregiver or physical therapist if you are unsure of the safety of some of your exercises. Greater weight gain will occur in the last 2 trimesters of pregnancy. Exercising will help:  · Control your weight.  · Keep you in shape.  · Prepare you for labor and delivery.  · Help you lose your pregnancy weight after you deliver your baby.  · Wear a good support or jogging bra for breast tenderness during pregnancy. This may help if worn during sleep too.  · Ask when prenatal classes are available. Begin classes when they are offered.  · Do not use hot tubs, steam rooms, or saunas.  · Wear your seat belt when driving. This protects you and your baby if you are in an accident.  · Avoid raw meat, uncooked cheese, cat litter boxes, and soil used by cats throughout the pregnancy. These carry germs that can cause birth defects in the baby.  · The first trimester is a good time to visit your dentist for your dental health. Getting your teeth cleaned is okay. Use a softer toothbrush and brush gently during pregnancy.  · Ask for help if you have financial, counseling, or nutritional needs  during pregnancy. Your caregiver will be able to offer counseling for these needs as well as refer you for other special needs.  · Do not take any medicines or herbs unless told by your caregiver.  · Inform your caregiver if there is any mental or physical domestic violence.  · Make a list of emergency phone numbers of family, friends, hospital, and police and fire departments.  · Write down your questions. Take them to your prenatal visit.  · Do not douche.  · Do not cross your legs.  · If you have to stand for long periods of time, rotate you feet or take small steps in a circle.  · You may have more vaginal secretions that may require a sanitary pad. Do not use tampons or scented sanitary pads.  MEDICINES AND DRUG USE IN PREGNANCY  ·   Take prenatal vitamins as directed. The vitamin should contain 1 milligram of folic acid. Keep all vitamins out of reach of children. Only a couple vitamins or tablets containing iron may be fatal to a baby or young child when ingested.  · Avoid use of all medicines, including herbs, over-the-counter medicines, not prescribed or suggested by your caregiver. Only take over-the-counter or prescription medicines for pain, discomfort, or fever as directed by your caregiver. Do not use aspirin, ibuprofen, or naproxen unless directed by your caregiver.  · Let your caregiver also know about herbs you may be using.  · Alcohol is related to a number of birth defects. This includes fetal alcohol syndrome. All alcohol, in any form, should be avoided completely. Smoking will cause low birth rate and premature babies.  · Street or illegal drugs are very harmful to the baby. They are absolutely forbidden. A baby born to an addicted mother will be addicted at birth. The baby will go through the same withdrawal an adult does.  · Let your caregiver know about any medicines that you have to take and for what reason you take them.  SEEK MEDICAL CARE IF:   You have any concerns or worries during your  pregnancy. It is better to call with your questions if you feel they cannot wait, rather than worry about them.  SEEK IMMEDIATE MEDICAL CARE IF:   · An unexplained oral temperature above 102° F (38.9° C) develops, or as your caregiver suggests.  · You have leaking of fluid from the vagina (birth canal). If leaking membranes are suspected, take your temperature and inform your caregiver of this when you call.  · There is vaginal spotting or bleeding. Notify your caregiver of the amount and how many pads are used.  · You develop a bad smelling vaginal discharge with a change in the color.  · You continue to feel sick to your stomach (nauseated) and have no relief from remedies suggested. You vomit blood or coffee ground-like materials.  · You lose more than 2 pounds of weight in 1 week.  · You gain more than 2 pounds of weight in 1 week and you notice swelling of your face, hands, feet, or legs.  · You gain 5 pounds or more in 1 week (even if you do not have swelling of your hands, face, legs, or feet).  · You get exposed to German measles and have never had them.  · You are exposed to fifth disease or chickenpox.  · You develop belly (abdominal) pain. Round ligament discomfort is a common non-cancerous (benign) cause of abdominal pain in pregnancy. Your caregiver still must evaluate this.  · You develop headache, fever, diarrhea, pain with urination, or shortness of breath.  · You fall or are in a car accident or have any kind of trauma.  · There is mental or physical violence in your home.  Document Released: 01/14/2001 Document Revised: 10/15/2011 Document Reviewed: 07/18/2008  ExitCare® Patient Information ©2014 [employer].
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
## 2013-01-14 LAB — CYSTIC FIBROSIS DIAGNOSTIC STUDY

## 2013-01-17 ENCOUNTER — Encounter: Payer: Self-pay | Admitting: Obstetrics & Gynecology

## 2013-01-17 DIAGNOSIS — R7689 Other specified abnormal immunological findings in serum: Secondary | ICD-10-CM | POA: Insufficient documentation

## 2013-01-17 DIAGNOSIS — R768 Other specified abnormal immunological findings in serum: Secondary | ICD-10-CM | POA: Insufficient documentation

## 2013-01-17 LAB — OBSTETRIC PANEL
Hepatitis B Surface Ag: NEGATIVE
Lymphocytes Relative: 19 % (ref 12–46)
Lymphs Abs: 1.9 10*3/uL (ref 0.7–4.0)
Neutrophils Relative %: 76 % (ref 43–77)
Platelets: 320 10*3/uL (ref 150–400)
RBC: 4.45 MIL/uL (ref 3.87–5.11)
Rubella: 2.94 Index — ABNORMAL HIGH (ref <0.90)
WBC: 9.8 10*3/uL (ref 4.0–10.5)

## 2013-01-17 LAB — PRENATAL ANTIBODY IDENTIFICATION

## 2013-01-18 MED ORDER — METRONIDAZOLE 500 MG PO TABS: 500.0000 mg | ORAL_TABLET | Freq: Two times a day (BID) | ORAL | Status: AC

## 2013-01-18 NOTE — Addendum Note (Signed)
Addended by: Jaynie Collins A on: 01/18/2013 08:21 AM   Modules accepted: Orders

## 2013-01-21 ENCOUNTER — Encounter: Payer: Self-pay | Admitting: Obstetrics & Gynecology

## 2013-02-03 NOTE — L&D Delivery Note (Signed)
Delivery Note Called to patient's room for delivery. AROM, clear fluid. Mother pushed over intact perineum. At 4:46 AM a viable female was delivered via Vaginal, Spontaneous Delivery (Presentation: Left Occiput Anterior).  APGAR: 9, 9; weight pending. Infant delivered to maternal abdomen. Delayed cord clamping. Clamped x 2 and cut. Active management of third stage with traction and pitocin.  Placenta status: Intact, Spontaneous.  Cord: 3 vessels with the following complications: None.  Cord pH: n/a  Anesthesia: Epidural  Episiotomy: None Lacerations: None Suture Repair: n/a Est. Blood Loss (mL): 400  Mom to postpartum.  Baby to Couplet care / Skin to Skin. CNM present for entire delivery.   Sunnie Nielsenlexander, [name] 07/30/2013, 5:18 AM

## 2013-02-03 NOTE — L&D Delivery Note (Signed)
I have seen and examined this patient and I agree with the above. SHAW, KIMBERLY CNM 9:09 AM 07/30/2013    

## 2013-02-04 ENCOUNTER — Other Ambulatory Visit: Payer: Self-pay | Admitting: Obstetrics & Gynecology

## 2013-02-04 DIAGNOSIS — Z3682 Encounter for antenatal screening for nuchal translucency: Secondary | ICD-10-CM

## 2013-02-08 ENCOUNTER — Other Ambulatory Visit: Payer: Self-pay

## 2013-02-08 ENCOUNTER — Ambulatory Visit (HOSPITAL_COMMUNITY)
Admission: RE | Admit: 2013-02-08 | Discharge: 2013-02-08 | Disposition: A | Discharge status: Home / Self Care | Payer: Medicaid Other | Source: Ambulatory Visit | Attending: Obstetrics & Gynecology | Admitting: Obstetrics & Gynecology

## 2013-02-08 DIAGNOSIS — O351XX0 Maternal care for (suspected) chromosomal abnormality in fetus, not applicable or unspecified: Secondary | ICD-10-CM | POA: Insufficient documentation

## 2013-02-08 DIAGNOSIS — O352XX0 Maternal care for (suspected) hereditary disease in fetus, not applicable or unspecified: Secondary | ICD-10-CM | POA: Insufficient documentation

## 2013-02-08 DIAGNOSIS — O99330 Smoking (tobacco) complicating pregnancy, unspecified trimester: Secondary | ICD-10-CM | POA: Insufficient documentation

## 2013-02-08 DIAGNOSIS — O3510X0 Maternal care for (suspected) chromosomal abnormality in fetus, unspecified, not applicable or unspecified: Secondary | ICD-10-CM | POA: Insufficient documentation

## 2013-02-08 DIAGNOSIS — Z3689 Encounter for other specified antenatal screening: Secondary | ICD-10-CM | POA: Insufficient documentation

## 2013-02-08 DIAGNOSIS — Z3682 Encounter for antenatal screening for nuchal translucency: Secondary | ICD-10-CM
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
## 2013-02-09 ENCOUNTER — Encounter: Payer: Self-pay | Admitting: Obstetrics & Gynecology

## 2013-02-09 ENCOUNTER — Ambulatory Visit (INDEPENDENT_AMBULATORY_CARE_PROVIDER_SITE_OTHER): Payer: Medicaid Other | Admitting: Obstetrics & Gynecology

## 2013-02-09 VITALS — BP 112/67 | Wt 196.2 lb

## 2013-02-09 DIAGNOSIS — O99891 Other specified diseases and conditions complicating pregnancy: Secondary | ICD-10-CM

## 2013-02-09 DIAGNOSIS — O09891 Supervision of other high risk pregnancies, first trimester: Secondary | ICD-10-CM

## 2013-02-09 DIAGNOSIS — N760 Acute vaginitis: Secondary | ICD-10-CM

## 2013-02-09 DIAGNOSIS — O09899 Supervision of other high risk pregnancies, unspecified trimester: Secondary | ICD-10-CM

## 2013-02-09 DIAGNOSIS — O26899 Other specified pregnancy related conditions, unspecified trimester: Secondary | ICD-10-CM

## 2013-02-09 DIAGNOSIS — Z3480 Encounter for supervision of other normal pregnancy, unspecified trimester: Secondary | ICD-10-CM

## 2013-02-09 DIAGNOSIS — Z3491 Encounter for supervision of normal pregnancy, unspecified, first trimester: Secondary | ICD-10-CM

## 2013-02-09 DIAGNOSIS — O9A311 Physical abuse complicating pregnancy, first trimester: Secondary | ICD-10-CM

## 2013-02-09 DIAGNOSIS — O9989 Other specified diseases and conditions complicating pregnancy, childbirth and the puerperium: Secondary | ICD-10-CM

## 2013-02-09 DIAGNOSIS — N898 Other specified noninflammatory disorders of vagina: Secondary | ICD-10-CM

## 2013-02-09 DIAGNOSIS — R768 Other specified abnormal immunological findings in serum: Secondary | ICD-10-CM

## 2013-02-09 DIAGNOSIS — Z8279 Family history of other congenital malformations, deformations and chromosomal abnormalities: Secondary | ICD-10-CM

## 2013-02-09 DIAGNOSIS — A499 Bacterial infection, unspecified: Secondary | ICD-10-CM

## 2013-02-09 DIAGNOSIS — R894 Abnormal immunological findings in specimens from other organs, systems and tissues: Secondary | ICD-10-CM

## 2013-02-09 DIAGNOSIS — B9689 Other specified bacterial agents as the cause of diseases classified elsewhere: Secondary | ICD-10-CM

## 2013-02-09 MED ORDER — METRONIDAZOLE 500 MG PO TABS: 500.0000 mg | ORAL_TABLET | Freq: Two times a day (BID) | ORAL | Status: AC

## 2013-02-09 NOTE — Progress Notes (Signed)
Normal NT.  [facility]   Still has thick yellow discharge, did not fill antibiotic given for previous diagnosis of BV, Metronidazole reordered.  No other complaints or concerns.  Routine obstetric precautions reviewed.

## 2013-02-09 NOTE — Patient Instructions (Signed)
Return to clinic for any obstetric concerns or go to [facility] for evaluation  

## 2013-02-09 NOTE — Progress Notes (Signed)
P-88  Patient states that she feel like her uterus is balling up, similar to stuff she usually feels later in pregnancy.  She also had a thick yellow discharge when she just used the restroom.

## 2013-02-10 NOTE — Progress Notes (Signed)
Genetic Counseling  High-Risk Gestation Note  Appointment Date:  02/08/2013 Referred By: Osborne Oman, MD Date of Birth:  07/07/1985   Pregnancy History: [id number] Estimated Date of Delivery: 08/23/13 Estimated Gestational Age: 37w0dAttending: MRenella Cunas MD  I met with Ms.DKathlee Nationsfor genetic counseling because of a previous child with Down syndrome.   [facility]was [facility]. We updated the family history from her previous genetic counseling visit. She reported that her daughter, LJacqulyn Liner was confirmed to have Down syndrome (trisomy 238 postnatally. She is currently 167 monthsold and reportedly doing well. The patient reported that her daughter receives multiple in-home therapies and is followed by Dr. PJaneal Holmes CFalun The current pregnancy is also with London's father. There were no additional updates to the family histories regarding birth defects, intellectual disability, recurrent pregnancy loss, or known genetic conditions.   We reviewed chromosomes, nondisjunction, and the associated ~ 1 in 758risk for fetal Down syndrome related to a maternal age of 28y.o..  She was counseled that the risk for aneuploidy decreases as gestational age increases, accounting for those pregnancies which spontaneously abort.  We specifically discussed Down syndrome (trisomy 224, trisomies 169and 177 and sex chromosome aneuploidies (47,XXX and 47,XXY) including the common features and prognoses of each.   We reviewed that literature suggests that the risk of recurrence for a homo/heterotrisomy depends on the maternal age at the index pregnancy, the maternal age at the time of the subsequent pregnancy, and the specific trisomy (Warburton 2004).  Considering that Ms. LGaspariniwas younger than 30years old at the time of delivery with her daughter and that she is currently 89y.o., her  adjusted risk for Down syndrome (homotrisomy) in the current pregnancy is ~1 in 834(1 in 720 x the standard morbidity ratio of 8.2).  The overall risk for any aneuploidy, considering Ms. Asa's maternal age of 28y.o. and her history of a previous child with Down syndrome, is estimated to be approximately 2.3%.     We reviewed available screening options including first trimester screen, noninvasive prenatal screening (NIPS)/cell free DNA testing, and detailed ultrasound.  We reviewed the benefits and limitations of these tests including the detection rates, false positive rates, and the specific conditions screened for which each screens.  She understands that screening tests provide a pregnancy specific risk for specific conditions, but are not considered to be diagnostic.  Ms. LOslandwas also counseled regarding diagnostic testing via amniocentesis.  We reviewed the approximate 1 in 3732-202risk for complications, including spontaneous pregnancy loss. After consideration of all the options, she expressed interest in having NIPS (Panorama) at the time of today's visit. Those results will be available in approximately 8-10 days.   Additionally, an ultrasound was performed today for nuchal translucency assessment. NT measurement was within normal range.  The ultrasound report will be sent under separate cover.  Detailed ultrasound is scheduled for 03/23/13.   Ms. LAyusodenied exposure to environmental toxins or chemical agents. She denied the use of street drugs. She reported smoking approximately 4-5 cigarettes per day. The associations of smoking in pregnancy were reviewed and cessation encouraged. She reported drinking alcohol prior to being aware of the pregnancy. She reported that the last alcohol exposure occurred approximately [redacted] weeks gestation, which was about 2 weeks before her initial OB visit. The all-or-none period was discussed, meaning exposures that occur in the  first 4 weeks of  gestation are typically thought to either not affect the pregnancy at all or result in a miscarriage.  Prenatal alcohol exposure can increase the risk for growth delays, small head size, heart defects, eye and facial differences, as well as behavior problems and learning disabilities. The risk of these to occur tends to increase with the amount of alcohol consumed. However, because there is no identified safe amount of alcohol in pregnancy, it is recommended to completely avoid alcohol in pregnancy. Given the reported amount of exposure, risk for associated effects are likely low in the current pregnancy. She denied significant viral illnesses during the course of her pregnancy. Her medical and surgical histories were noncontributory.   I counseled Ms. [name] regarding the above risks and available options.  The approximate face-to-face time with the genetic counselor was 35 minutes.    Chipper Oman, MS Certified Genetic Counselor 02/10/2013

## 2013-02-15 ENCOUNTER — Telehealth (HOSPITAL_COMMUNITY): Payer: Self-pay | Admitting: MS"

## 2013-02-15 NOTE — Telephone Encounter (Signed)
Called Norton BlizzardDominique A Rosell to discuss her cell free fetal DNA test results.  Mrs. Norton Blizzardominique A Schwertner had Panor[facility] testing through [facility].  Testing was offered because of previous pregnancy with Down syndrome.   The patient was identified by name and DOB.  We reviewed that these are within normal limits, showing a less than 1 in 10,000 risk for trisomies 21, 18 and 13, and monosomy X (Turner syndrome).  In addition, the risk for triploidy/vanishing twin and sex chromosome trisomies (47,XXX and 47,XXY) was also low risk.    We reviewed that this testing identifies > 99% of pregnancies with trisomy 2021, trisomy 2513, sex chromosome trisomies (47,XXX and 47,XXY), and triploidy. The detection rate for trisomy 18 is 96%.  The detection rate for monosomy X is ~92%.  The false positive rate is <0.1% for all conditions. Testing was also consistent with female gender.  The patient did wish to know gender.  She understands that this testing does not identify all genetic conditions.  All questions were answered to her satisfaction, she was encouraged to call with additional questions or concerns.  Mady Gemmaaragh Conrad, MS Certified Genetic Counselor

## 2013-03-09 ENCOUNTER — Ambulatory Visit (INDEPENDENT_AMBULATORY_CARE_PROVIDER_SITE_OTHER): Payer: Medicaid Other | Admitting: Family Medicine

## 2013-03-09 ENCOUNTER — Encounter: Payer: Self-pay | Admitting: Family Medicine

## 2013-03-09 VITALS — BP 92/68 | Wt 194.8 lb

## 2013-03-09 DIAGNOSIS — N760 Acute vaginitis: Secondary | ICD-10-CM

## 2013-03-09 DIAGNOSIS — A499 Bacterial infection, unspecified: Secondary | ICD-10-CM

## 2013-03-09 DIAGNOSIS — Z3490 Encounter for supervision of normal pregnancy, unspecified, unspecified trimester: Secondary | ICD-10-CM

## 2013-03-09 DIAGNOSIS — B9689 Other specified bacterial agents as the cause of diseases classified elsewhere: Secondary | ICD-10-CM

## 2013-03-09 DIAGNOSIS — Z3480 Encounter for supervision of other normal pregnancy, unspecified trimester: Secondary | ICD-10-CM

## 2013-03-09 MED ORDER — METRONIDAZOLE 500 MG PO TABS: 500.0000 mg | ORAL_TABLET | Freq: Two times a day (BID) | ORAL | Status: DC

## 2013-03-09 MED ORDER — PRENATAL VITAMINS 0.8 MG PO TABS: 1.0000 | ORAL_TABLET | Freq: Every day | ORAL | Status: DC

## 2013-03-09 NOTE — Progress Notes (Signed)
P-[id number]  Patient is doing well, she was unable to pick up rx for metronidazole for bv and asks that we call it back in for her as her medicaid has kicked in now and she will be able to pick it up.

## 2013-03-09 NOTE — Patient Instructions (Signed)
Second Trimester of Pregnancy The second trimester is from week 13 through week 28, months 4 through 6. The second trimester is often a time when you feel your best. Your body has also adjusted to being pregnant, and you begin to feel better physically. Usually, morning sickness has lessened or quit completely, you may have more energy, and you may have an increase in appetite. The second trimester is also a time when the fetus is growing rapidly. At the end of the sixth month, the fetus is about 9 inches long and weighs about 1 pounds. You will likely begin to feel the baby move (quickening) between 18 and 20 weeks of the pregnancy. BODY CHANGES Your body goes through many changes during pregnancy. The changes vary from woman to woman.   Your weight will continue to increase. You will notice your lower abdomen bulging out.  You may begin to get stretch marks on your hips, abdomen, and breasts.  You may develop headaches that can be relieved by medicines approved by your caregiver.  You may urinate more often because the fetus is pressing on your bladder.  You may develop or continue to have heartburn as a result of your pregnancy.  You may develop constipation because certain hormones are causing the muscles that push waste through your intestines to slow down.  You may develop hemorrhoids or swollen, bulging veins (varicose veins).  You may have back pain because of the weight gain and pregnancy hormones relaxing your joints between the bones in your pelvis and as a result of a shift in weight and the muscles that support your balance.  Your breasts will continue to grow and be tender.  Your gums may bleed and may be sensitive to brushing and flossing.  Dark spots or blotches (chloasma, mask of pregnancy) may develop on your face. This will likely fade after the baby is born.  A dark line from your belly button to the pubic area (linea nigra) may appear. This will likely fade after  the baby is born. WHAT TO EXPECT AT YOUR PRENATAL VISITS During a routine prenatal visit:  You will be weighed to make sure you and the fetus are growing normally.  Your blood pressure will be taken.  Your abdomen will be measured to track your baby's growth.  The fetal heartbeat will be listened to.  Any test results from the previous visit will be discussed. Your caregiver may ask you:  How you are feeling.  If you are feeling the baby move.  If you have had any abnormal symptoms, such as leaking fluid, bleeding, severe headaches, or abdominal cramping.  If you have any questions. Other tests that may be performed during your second trimester include:  Blood tests that check for:  Low iron levels (anemia).  Gestational diabetes (between 24 and 28 weeks).  Rh antibodies.  Urine tests to check for infections, diabetes, or protein in the urine.  An ultrasound to confirm the proper growth and development of the baby.  An amniocentesis to check for possible genetic problems.  Fetal screens for spina bifida and Down syndrome. HOME CARE INSTRUCTIONS   Avoid all smoking, herbs, alcohol, and unprescribed drugs. These chemicals affect the formation and growth of the baby.  Follow your caregiver's instructions regarding medicine use. There are medicines that are either safe or unsafe to take during pregnancy.  Exercise only as directed by your caregiver. Experiencing uterine cramps is a good sign to stop exercising.  Continue to eat regular,   healthy meals.  Wear a good support bra for breast tenderness.  Do not use hot tubs, steam rooms, or saunas.  Wear your seat belt at all times when driving.  Avoid raw meat, uncooked cheese, cat litter boxes, and soil used by cats. These carry germs that can cause birth defects in the baby.  Take your prenatal vitamins.  Try taking a stool softener (if your caregiver approves) if you develop constipation. Eat more high-fiber  foods, such as fresh vegetables or fruit and whole grains. Drink plenty of fluids to keep your urine clear or pale yellow.  Take warm sitz baths to soothe any pain or discomfort caused by hemorrhoids. Use hemorrhoid cream if your caregiver approves.  If you develop varicose veins, wear support hose. Elevate your feet for 15 minutes, 3 4 times a day. Limit salt in your diet.  Avoid heavy lifting, wear low heel shoes, and practice good posture.  Rest with your legs elevated if you have leg cramps or low back pain.  Visit your dentist if you have not gone yet during your pregnancy. Use a soft toothbrush to brush your teeth and be gentle when you floss.  A sexual relationship may be continued unless your caregiver directs you otherwise.  Continue to go to all your prenatal visits as directed by your caregiver. SEEK MEDICAL CARE IF:   You have dizziness.  You have mild pelvic cramps, pelvic pressure, or nagging pain in the abdominal area.  You have persistent nausea, vomiting, or diarrhea.  You have a bad smelling vaginal discharge.  You have pain with urination. SEEK IMMEDIATE MEDICAL CARE IF:   You have a fever.  You are leaking fluid from your vagina.  You have spotting or bleeding from your vagina.  You have severe abdominal cramping or pain.  You have rapid weight gain or loss.  You have shortness of breath with chest pain.  You notice sudden or extreme swelling of your face, hands, ankles, feet, or legs.  You have not felt your baby move in over an hour.  You have severe headaches that do not go away with medicine.  You have vision changes. Document Released: 01/14/2001 Document Revised: 09/22/2012 Document Reviewed: 03/23/2012 Exit[employer] Patient Information 2014 [employer].  Breastfeeding Deciding to breastfeed is one of the best choices you can make for you and your baby. A change in hormones during pregnancy causes your breast tissue to grow and increases the  number and size of your milk ducts. These hormones also allow proteins, sugars, and fats from your blood supply to make breast milk in your milk-producing glands. Hormones prevent breast milk from being released before your baby is born as well as prompt milk flow after birth. Once breastfeeding has begun, thoughts of your baby, as well as his or her sucking or crying, can stimulate the release of milk from your milk-producing glands.  BENEFITS OF BREASTFEEDING For Your Baby  Your first milk (colostrum) helps your baby's digestive system function better.   There are antibodies in your milk that help your baby fight off infections.   Your baby has a lower incidence of asthma, allergies, and sudden infant death syndrome.   The nutrients in breast milk are better for your baby than infant formulas and are designed uniquely for your baby's needs.   Breast milk improves your baby's brain development.   Your baby is less likely to develop other conditions, such as childhood obesity, asthma, or type 2 diabetes mellitus.  For   You   Breastfeeding helps to create a very special bond between you and your baby.   Breastfeeding is convenient. Breast milk is always available at the correct temperature and costs nothing.   Breastfeeding helps to burn calories and helps you lose the weight gained during pregnancy.   Breastfeeding makes your uterus contract to its prepregnancy size faster and slows bleeding (lochia) after you give birth.   Breastfeeding helps to lower your risk of developing type 2 diabetes mellitus, osteoporosis, and breast or ovarian cancer later in life. SIGNS THAT YOUR BABY IS HUNGRY Early Signs of Hunger  Increased alertness or activity.  Stretching.  Movement of the head from side to side.  Movement of the head and opening of the mouth when the corner of the mouth or cheek is stroked (rooting).  Increased sucking sounds, smacking lips, cooing, sighing, or  squeaking.  Hand-to-mouth movements.  Increased sucking of fingers or hands. Late Signs of Hunger  Fussing.  Intermittent crying. Extreme Signs of Hunger Signs of extreme hunger will require calming and consoling before your baby will be able to breastfeed successfully. Do not wait for the following signs of extreme hunger to occur before you initiate breastfeeding:   Restlessness.  A loud, strong cry.   Screaming. BREASTFEEDING BASICS Breastfeeding Initiation  Find a comfortable place to sit or lie down, with your neck and back well supported.  Place a pillow or rolled up blanket under your baby to bring him or her to the level of your breast (if you are seated). Nursing pillows are specially designed to help support your arms and your baby while you breastfeed.  Make sure that your baby's abdomen is facing your abdomen.   Gently massage your breast. With your fingertips, massage from your chest wall toward your nipple in a circular motion. This encourages milk flow. You may need to continue this action during the feeding if your milk flows slowly.  Support your breast with 4 fingers underneath and your thumb above your nipple. Make sure your fingers are well away from your nipple and your baby's mouth.   Stroke your baby's lips gently with your finger or nipple.   When your baby's mouth is open wide enough, quickly bring your baby to your breast, placing your entire nipple and as much of the colored area around your nipple (areola) as possible into your baby's mouth.   More areola should be visible above your baby's upper lip than below the lower lip.   Your baby's tongue should be between his or her lower gum and your breast.   Ensure that your baby's mouth is correctly positioned around your nipple (latched). Your baby's lips should create a seal on your breast and be turned out (everted).  It is common for your baby to suck about 2 3 minutes in order to start the  flow of breast milk. Latching Teaching your baby how to latch on to your breast properly is very important. An improper latch can cause nipple pain and decreased milk supply for you and poor weight gain in your baby. Also, if your baby is not latched onto your nipple properly, he or she may swallow some air during feeding. This can make your baby fussy. Burping your baby when you switch breasts during the feeding can help to get rid of the air. However, teaching your baby to latch on properly is still the best way to prevent fussiness from swallowing air while breastfeeding. Signs that your baby has   successfully latched on to your nipple:    Silent tugging or silent sucking, without causing you pain.   Swallowing heard between every 3 4 sucks.    Muscle movement above and in front of his or her ears while sucking.  Signs that your baby has not successfully latched on to nipple:   Sucking sounds or smacking sounds from your baby while breastfeeding.  Nipple pain. If you think your baby has not latched on correctly, slip your finger into the corner of your baby's mouth to break the suction and place it between your baby's gums. Attempt breastfeeding initiation again. Signs of Successful Breastfeeding Signs from your baby:   A gradual decrease in the number of sucks or complete cessation of sucking.   Falling asleep.   Relaxation of his or her body.   Retention of a small amount of milk in his or her mouth.   Letting go of your breast by himself or herself. Signs from you:  Breasts that have increased in firmness, weight, and size 1 3 hours after feeding.   Breasts that are softer immediately after breastfeeding.  Increased milk volume, as well as a change in milk consistency and color by the 5th day of breastfeeding.   Nipples that are not sore, cracked, or bleeding. Signs That Your Baby is Getting Enough Milk  Wetting at least 3 diapers in a 24-hour period. The urine  should be clear and pale yellow by age 5 days.  At least 3 stools in a 24-hour period by age 5 days. The stool should be soft and yellow.  At least 3 stools in a 24-hour period by age 7 days. The stool should be seedy and yellow.  No loss of weight greater than 10% of birth weight during the first 3 days of age.  Average weight gain of 4 7 ounces (120 210 mL) per week after age 4 days.  Consistent daily weight gain by age 5 days, without weight loss after the age of 2 weeks. After a feeding, your baby may spit up a small amount. This is common. BREASTFEEDING FREQUENCY AND DURATION Frequent feeding will help you make more milk and can prevent sore nipples and breast engorgement. Breastfeed when you feel the need to reduce the fullness of your breasts or when your baby shows signs of hunger. This is called "breastfeeding on demand." Avoid introducing a pacifier to your baby while you are working to establish breastfeeding (the first 4 6 weeks after your baby is born). After this time you may choose to use a pacifier. Research has shown that pacifier use during the first year of a baby's life decreases the risk of sudden infant death syndrome (SIDS). Allow your baby to feed on each breast as long as he or she wants. Breastfeed until your baby is finished feeding. When your baby unlatches or falls asleep while feeding from the first breast, offer the second breast. Because newborns are often sleepy in the first few weeks of life, you may need to awaken your baby to get him or her to feed. Breastfeeding times will vary from baby to baby. However, the following rules can serve as a guide to help you ensure that your baby is properly fed:  Newborns (babies 4 weeks of age or younger) may breastfeed every 1 3 hours.  Newborns should not go longer than 3 hours during the day or 5 hours during the night without breastfeeding.  You should breastfeed your baby a minimum of   8 times in a 24-hour period until  you begin to introduce solid foods to your baby at around 6 months of age. BREAST MILK PUMPING Pumping and storing breast milk allows you to ensure that your baby is exclusively fed your breast milk, even at times when you are unable to breastfeed. This is especially important if you are going back to work while you are still breastfeeding or when you are not able to be present during feedings. Your lactation consultant can give you guidelines on how long it is safe to store breast milk.  A breast pump is a machine that allows you to pump milk from your breast into a sterile bottle. The pumped breast milk can then be stored in a refrigerator or freezer. Some breast pumps are operated by hand, while others use electricity. Ask your lactation consultant which type will work best for you. Breast pumps can be purchased, but some hospitals and breastfeeding support groups lease breast pumps on a monthly basis. A lactation consultant can teach you how to hand express breast milk, if you prefer not to use a pump.  CARING FOR YOUR BREASTS WHILE YOU BREASTFEED Nipples can become dry, cracked, and sore while breastfeeding. The following recommendations can help keep your breasts moisturized and healthy:  Avoid using soap on your nipples.   Wear a supportive bra. Although not required, special nursing bras and tank tops are designed to allow access to your breasts for breastfeeding without taking off your entire bra or top. Avoid wearing underwire style bras or extremely tight bras.  Air dry your nipples for 3 4minutes after each feeding.   Use only cotton bra pads to absorb leaked breast milk. Leaking of breast milk between feedings is normal.   Use lanolin on your nipples after breastfeeding. Lanolin helps to maintain your skin's normal moisture barrier. If you use pure lanolin you do not need to wash it off before feeding your baby again. Pure lanolin is not toxic to your baby. You may also hand express a  few drops of breast milk and gently massage that milk into your nipples and allow the milk to air dry. In the first few weeks after giving birth, some women experience extremely full breasts (engorgement). Engorgement can make your breasts feel heavy, warm, and tender to the touch. Engorgement peaks within 3 5 days after you give birth. The following recommendations can help ease engorgement:  Completely empty your breasts while breastfeeding or pumping. You may want to start by applying warm, moist heat (in the shower or with warm water-soaked hand towels) just before feeding or pumping. This increases circulation and helps the milk flow. If your baby does not completely empty your breasts while breastfeeding, pump any extra milk after he or she is finished.  Wear a snug bra (nursing or regular) or tank top for 1 2 days to signal your body to slightly decrease milk production.  Apply ice packs to your breasts, unless this is too uncomfortable for you.  Make sure that your baby is latched on and positioned properly while breastfeeding. If engorgement persists after 48 hours of following these recommendations, contact your health care provider or a lactation consultant. OVERALL HEALTH CARE RECOMMENDATIONS WHILE BREASTFEEDING  Eat healthy foods. Alternate between meals and snacks, eating 3 of each per day. Because what you eat affects your breast milk, some of the foods may make your baby more irritable than usual. Avoid eating these foods if you are sure that they are   negatively affecting your baby.  Drink milk, fruit juice, and water to satisfy your thirst (about 10 glasses a day).   Rest often, relax, and continue to take your prenatal vitamins to prevent fatigue, stress, and anemia.  Continue breast self-awareness checks.  Avoid chewing and smoking tobacco.  Avoid alcohol and drug use. Some medicines that may be harmful to your baby can pass through breast milk. It is important to ask your  health care provider before taking any medicine, including all over-the-counter and prescription medicine as well as vitamin and herbal supplements. It is possible to become pregnant while breastfeeding. If birth control is desired, ask your health care provider about options that will be safe for your baby. SEEK MEDICAL CARE IF:   You feel like you want to stop breastfeeding or have become frustrated with breastfeeding.  You have painful breasts or nipples.  Your nipples are cracked or bleeding.  Your breasts are red, tender, or warm.  You have a swollen area on either breast.  You have a fever or chills.  You have nausea or vomiting.  You have drainage other than breast milk from your nipples.  Your breasts do not become full before feedings by the 5th day after you give birth.  You feel sad and depressed.  Your baby is too sleepy to eat well.  Your baby is having trouble sleeping.   Your baby is wetting less than 3 diapers in a 24-hour period.  Your baby has less than 3 stools in a 24-hour period.  Your baby's skin or the white part of his or her eyes becomes yellow.   Your baby is not gaining weight by 5 days of age. SEEK IMMEDIATE MEDICAL CARE IF:   Your baby is overly tired (lethargic) and does not want to wake up and feed.  Your baby develops an unexplained fever. Document Released: 01/20/2005 Document Revised: 09/22/2012 Document Reviewed: 07/14/2012 [employer] Patient Information 2014 [employer].  

## 2013-03-09 NOTE — Progress Notes (Signed)
Doing well--had normal [facility]--reviewed. Offered flu shot--she declines Offered AFP, states done elsewhere Needs PNV refill Scheduled for anatomy

## 2013-03-22 ENCOUNTER — Other Ambulatory Visit: Payer: Self-pay | Admitting: Obstetrics & Gynecology

## 2013-03-22 DIAGNOSIS — Z0489 Encounter for examination and observation for other specified reasons: Secondary | ICD-10-CM

## 2013-03-22 DIAGNOSIS — IMO0002 Reserved for concepts with insufficient information to code with codable children: Secondary | ICD-10-CM

## 2013-03-22 DIAGNOSIS — O09299 Supervision of pregnancy with other poor reproductive or obstetric history, unspecified trimester: Secondary | ICD-10-CM

## 2013-03-23 ENCOUNTER — Ambulatory Visit (HOSPITAL_COMMUNITY)
Admission: RE | Admit: 2013-03-23 | Discharge: 2013-03-23 | Disposition: A | Discharge status: Home / Self Care | Payer: Medicaid Other | Source: Ambulatory Visit | Attending: Obstetrics & Gynecology | Admitting: Obstetrics & Gynecology

## 2013-03-23 DIAGNOSIS — Z1389 Encounter for screening for other disorder: Secondary | ICD-10-CM | POA: Insufficient documentation

## 2013-03-23 DIAGNOSIS — O09299 Supervision of pregnancy with other poor reproductive or obstetric history, unspecified trimester: Secondary | ICD-10-CM

## 2013-03-23 DIAGNOSIS — O99330 Smoking (tobacco) complicating pregnancy, unspecified trimester: Secondary | ICD-10-CM | POA: Insufficient documentation

## 2013-03-23 DIAGNOSIS — Z0489 Encounter for examination and observation for other specified reasons: Secondary | ICD-10-CM

## 2013-03-23 DIAGNOSIS — O358XX0 Maternal care for other (suspected) fetal abnormality and damage, not applicable or unspecified: Secondary | ICD-10-CM | POA: Insufficient documentation

## 2013-03-23 DIAGNOSIS — Z363 Encounter for antenatal screening for malformations: Secondary | ICD-10-CM | POA: Insufficient documentation

## 2013-03-23 DIAGNOSIS — IMO0002 Reserved for concepts with insufficient information to code with codable children: Secondary | ICD-10-CM

## 2013-03-23 DIAGNOSIS — O352XX0 Maternal care for (suspected) hereditary disease in fetus, not applicable or unspecified: Secondary | ICD-10-CM | POA: Insufficient documentation

## 2013-03-23 NOTE — Progress Notes (Signed)
CSW received call from [facility] staff stating patient has requested to speak with CSW as a follow up to our conversation on 02/08/13.  Patient informed CSW that she is completely "done" with FOB at this point, as she found out yesterday that he has been cheating on her for the past two months.  She states she is glad it is finally over and that she is not going back.  CSW asked her how the relationship has been since our last conversation and what has happened with the warrant.  Patient states she never turned him in.  She is saddened by this recent occurrence, but states she feels she needed something to happen as the last straw to finally end their relationship.  She states he is out of the home already.  CSW asked if CPS is still involved and she states her worker has been switched to [employer].  CSW asked her how she is feeling about the pregnancy at this point.  Patient continues to waiver about her feelings/plans.  She still states she may want to have an abortion, but states she has already felt the baby moving and feels it would be hard to go through with this.  She also commented that adoption would be a more selfless option.  CSW again told patient that she can discuss her options with CSW at any time.  She was very Adult nurseappreciative.  She then asked CSW about open vs closed adoptions.  CSW discussed this with her and she seems to be somewhat considering this as a possibility at this time.  She did not request further information at this time, but has CSW's contact information if she has further questions about this or desires any other support.  CSW thanked patient for talking with CSW today and patient again thanked CSW.

## 2013-04-05 ENCOUNTER — Ambulatory Visit (INDEPENDENT_AMBULATORY_CARE_PROVIDER_SITE_OTHER): Payer: Medicaid Other | Admitting: Obstetrics & Gynecology

## 2013-04-05 ENCOUNTER — Encounter: Payer: Self-pay | Admitting: Obstetrics & Gynecology

## 2013-04-05 VITALS — BP 109/64 | Wt 199.0 lb

## 2013-04-05 DIAGNOSIS — Z3480 Encounter for supervision of other normal pregnancy, unspecified trimester: Secondary | ICD-10-CM

## 2013-04-05 DIAGNOSIS — Z113 Encounter for screening for infections with a predominantly sexual mode of transmission: Secondary | ICD-10-CM

## 2013-04-05 DIAGNOSIS — Z3490 Encounter for supervision of normal pregnancy, unspecified, unspecified trimester: Secondary | ICD-10-CM

## 2013-04-05 NOTE — Progress Notes (Signed)
Routine visit. Good FM. She is requesting STI testing "for everything". She would like to be tested for HSV.

## 2013-04-05 NOTE — Progress Notes (Signed)
P=[id number] 

## 2013-04-06 ENCOUNTER — Encounter: Payer: Self-pay | Admitting: Obstetrics & Gynecology

## 2013-04-06 DIAGNOSIS — B009 Herpesviral infection, unspecified: Secondary | ICD-10-CM | POA: Insufficient documentation

## 2013-04-06 DIAGNOSIS — O98519 Other viral diseases complicating pregnancy, unspecified trimester: Secondary | ICD-10-CM

## 2013-04-06 LAB — GC/CHLAMYDIA PROBE AMP
CT PROBE, AMP APTIMA: NEGATIVE
GC PROBE AMP APTIMA: NEGATIVE

## 2013-04-06 LAB — RPR

## 2013-04-06 LAB — HSV 2 ANTIBODY, IGG: HSV 2 Glycoprotein G Ab, IgG: 2.15 IV — ABNORMAL HIGH

## 2013-04-06 LAB — HEPATITIS B SURFACE ANTIGEN: HEP B S AG: NEGATIVE

## 2013-04-06 LAB — HEPATITIS C ANTIBODY: HCV Ab: NEGATIVE

## 2013-04-06 LAB — HIV ANTIBODY (ROUTINE TESTING W REFLEX): HIV: NONREACTIVE

## 2013-05-03 ENCOUNTER — Encounter: Payer: Self-pay | Admitting: Family Medicine

## 2013-05-03 ENCOUNTER — Ambulatory Visit (INDEPENDENT_AMBULATORY_CARE_PROVIDER_SITE_OTHER): Payer: Medicaid Other | Admitting: Family Medicine

## 2013-05-03 VITALS — BP 112/69 | Wt 201.0 lb

## 2013-05-03 DIAGNOSIS — Z3490 Encounter for supervision of normal pregnancy, unspecified, unspecified trimester: Secondary | ICD-10-CM

## 2013-05-03 DIAGNOSIS — Z3480 Encounter for supervision of other normal pregnancy, unspecified trimester: Secondary | ICD-10-CM

## 2013-05-03 NOTE — Progress Notes (Signed)
Doing well no complaints  

## 2013-05-03 NOTE — Patient Instructions (Addendum)
Second Trimester of Pregnancy The second trimester is from week 13 through week 28, months 4 through 6. The second trimester is often a time when you feel your best. Your body has also adjusted to being pregnant, and you begin to feel better physically. Usually, morning sickness has lessened or quit completely, you may have more energy, and you may have an increase in appetite. The second trimester is also a time when the fetus is growing rapidly. At the end of the sixth month, the fetus is about 9 inches long and weighs about 1 pounds. You will likely begin to feel the baby move (quickening) between 18 and 20 weeks of the pregnancy. BODY CHANGES Your body goes through many changes during pregnancy. The changes vary from woman to woman.   Your weight will continue to increase. You will notice your lower abdomen bulging out.  You may begin to get stretch marks on your hips, abdomen, and breasts.  You may develop headaches that can be relieved by medicines approved by your caregiver.  You may urinate more often because the fetus is pressing on your bladder.  You may develop or continue to have heartburn as a result of your pregnancy.  You may develop constipation because certain hormones are causing the muscles that push waste through your intestines to slow down.  You may develop hemorrhoids or swollen, bulging veins (varicose veins).  You may have back pain because of the weight gain and pregnancy hormones relaxing your joints between the bones in your pelvis and as a result of a shift in weight and the muscles that support your balance.  Your breasts will continue to grow and be tender.  Your gums may bleed and may be sensitive to brushing and flossing.  Dark spots or blotches (chloasma, mask of pregnancy) may develop on your face. This will likely fade after the baby is born.  A dark line from your belly button to the pubic area (linea nigra) may appear. This will likely fade after  the baby is born. WHAT TO EXPECT AT YOUR PRENATAL VISITS During a routine prenatal visit:  You will be weighed to make sure you and the fetus are growing normally.  Your blood pressure will be taken.  Your abdomen will be measured to track your baby's growth.  The fetal heartbeat will be listened to.  Any test results from the previous visit will be discussed. Your caregiver may ask you:  How you are feeling.  If you are feeling the baby move.  If you have had any abnormal symptoms, such as leaking fluid, bleeding, severe headaches, or abdominal cramping.  If you have any questions. Other tests that may be performed during your second trimester include:  Blood tests that check for:  Low iron levels (anemia).  Gestational diabetes (between 24 and 28 weeks).  Rh antibodies.  Urine tests to check for infections, diabetes, or protein in the urine.  An ultrasound to confirm the proper growth and development of the baby.  An amniocentesis to check for possible genetic problems.  Fetal screens for spina bifida and Down syndrome. HOME CARE INSTRUCTIONS   Avoid all smoking, herbs, alcohol, and unprescribed drugs. These chemicals affect the formation and growth of the baby.  Follow your caregiver's instructions regarding medicine use. There are medicines that are either safe or unsafe to take during pregnancy.  Exercise only as directed by your caregiver. Experiencing uterine cramps is a good sign to stop exercising.  Continue to eat regular,  healthy meals.  Wear a good support bra for breast tenderness.  Do not use hot tubs, steam rooms, or saunas.  Wear your seat belt at all times when driving.  Avoid raw meat, uncooked cheese, cat litter boxes, and soil used by cats. These carry germs that can cause birth defects in the baby.  Take your prenatal vitamins.  Try taking a stool softener (if your caregiver approves) if you develop constipation. Eat more high-fiber  foods, such as fresh vegetables or fruit and whole grains. Drink plenty of fluids to keep your urine clear or pale yellow.  Take warm sitz baths to soothe any pain or discomfort caused by hemorrhoids. Use hemorrhoid cream if your caregiver approves.  If you develop varicose veins, wear support hose. Elevate your feet for 15 minutes, 3 4 times a day. Limit salt in your diet.  Avoid heavy lifting, wear low heel shoes, and practice good posture.  Rest with your legs elevated if you have leg cramps or low back pain.  Visit your dentist if you have not gone yet during your pregnancy. Use a soft toothbrush to brush your teeth and be gentle when you floss.  A sexual relationship may be continued unless your caregiver directs you otherwise.  Continue to go to all your prenatal visits as directed by your caregiver. SEEK MEDICAL CARE IF:   You have dizziness.  You have mild pelvic cramps, pelvic pressure, or nagging pain in the abdominal area.  You have persistent nausea, vomiting, or diarrhea.  You have a bad smelling vaginal discharge.  You have pain with urination. SEEK IMMEDIATE MEDICAL CARE IF:   You have a fever.  You are leaking fluid from your vagina.  You have spotting or bleeding from your vagina.  You have severe abdominal cramping or pain.  You have rapid weight gain or loss.  You have shortness of breath with chest pain.  You notice sudden or extreme swelling of your face, hands, ankles, feet, or legs.  You have not felt your baby move in over an hour.  You have severe headaches that do not go away with medicine.  You have vision changes. Document Released: 01/14/2001 Document Revised: 09/22/2012 Document Reviewed: 03/23/2012 [employer] Patient Information 2014 [employer].  Breastfeeding Deciding to breastfeed is one of the best choices you can make for you and your baby. A change in hormones during pregnancy causes your breast tissue to grow and increases the  number and size of your milk ducts. These hormones also allow proteins, sugars, and fats from your blood supply to make breast milk in your milk-producing glands. Hormones prevent breast milk from being released before your baby is born as well as prompt milk flow after birth. Once breastfeeding has begun, thoughts of your baby, as well as his or her sucking or crying, can stimulate the release of milk from your milk-producing glands.  BENEFITS OF BREASTFEEDING For Your Baby  Your first milk (colostrum) helps your baby's digestive system function better.   There are antibodies in your milk that help your baby fight off infections.   Your baby has a lower incidence of asthma, allergies, and sudden infant death syndrome.   The nutrients in breast milk are better for your baby than infant formulas and are designed uniquely for your baby's needs.   Breast milk improves your baby's brain development.   Your baby is less likely to develop other conditions, such as childhood obesity, asthma, or type 2 diabetes mellitus.  For   You   Breastfeeding helps to create a very special bond between you and your baby.   Breastfeeding is convenient. Breast milk is always available at the correct temperature and costs nothing.   Breastfeeding helps to burn calories and helps you lose the weight gained during pregnancy.   Breastfeeding makes your uterus contract to its prepregnancy size faster and slows bleeding (lochia) after you give birth.   Breastfeeding helps to lower your risk of developing type 2 diabetes mellitus, osteoporosis, and breast or ovarian cancer later in life. SIGNS THAT YOUR BABY IS HUNGRY Early Signs of Hunger  Increased alertness or activity.  Stretching.  Movement of the head from side to side.  Movement of the head and opening of the mouth when the corner of the mouth or cheek is stroked (rooting).  Increased sucking sounds, smacking lips, cooing, sighing, or  squeaking.  Hand-to-mouth movements.  Increased sucking of fingers or hands. Late Signs of Hunger  Fussing.  Intermittent crying. Extreme Signs of Hunger Signs of extreme hunger will require calming and consoling before your baby will be able to breastfeed successfully. Do not wait for the following signs of extreme hunger to occur before you initiate breastfeeding:   Restlessness.  A loud, strong cry.   Screaming. BREASTFEEDING BASICS Breastfeeding Initiation  Find a comfortable place to sit or lie down, with your neck and back well supported.  Place a pillow or rolled up blanket under your baby to bring him or her to the level of your breast (if you are seated). Nursing pillows are specially designed to help support your arms and your baby while you breastfeed.  Make sure that your baby's abdomen is facing your abdomen.   Gently massage your breast. With your fingertips, massage from your chest wall toward your nipple in a circular motion. This encourages milk flow. You may need to continue this action during the feeding if your milk flows slowly.  Support your breast with 4 fingers underneath and your thumb above your nipple. Make sure your fingers are well away from your nipple and your baby's mouth.   Stroke your baby's lips gently with your finger or nipple.   When your baby's mouth is open wide enough, quickly bring your baby to your breast, placing your entire nipple and as much of the colored area around your nipple (areola) as possible into your baby's mouth.   More areola should be visible above your baby's upper lip than below the lower lip.   Your baby's tongue should be between his or her lower gum and your breast.   Ensure that your baby's mouth is correctly positioned around your nipple (latched). Your baby's lips should create a seal on your breast and be turned out (everted).  It is common for your baby to suck about 2 3 minutes in order to start the  flow of breast milk. Latching Teaching your baby how to latch on to your breast properly is very important. An improper latch can cause nipple pain and decreased milk supply for you and poor weight gain in your baby. Also, if your baby is not latched onto your nipple properly, he or she may swallow some air during feeding. This can make your baby fussy. Burping your baby when you switch breasts during the feeding can help to get rid of the air. However, teaching your baby to latch on properly is still the best way to prevent fussiness from swallowing air while breastfeeding. Signs that your baby has   successfully latched on to your nipple:    Silent tugging or silent sucking, without causing you pain.   Swallowing heard between every 3 4 sucks.    Muscle movement above and in front of his or her ears while sucking.  Signs that your baby has not successfully latched on to nipple:   Sucking sounds or smacking sounds from your baby while breastfeeding.  Nipple pain. If you think your baby has not latched on correctly, slip your finger into the corner of your baby's mouth to break the suction and place it between your baby's gums. Attempt breastfeeding initiation again. Signs of Successful Breastfeeding Signs from your baby:   A gradual decrease in the number of sucks or complete cessation of sucking.   Falling asleep.   Relaxation of his or her body.   Retention of a small amount of milk in his or her mouth.   Letting go of your breast by himself or herself. Signs from you:  Breasts that have increased in firmness, weight, and size 1 3 hours after feeding.   Breasts that are softer immediately after breastfeeding.  Increased milk volume, as well as a change in milk consistency and color by the 5th day of breastfeeding.   Nipples that are not sore, cracked, or bleeding. Signs That Your Baby is Getting Enough Milk  Wetting at least 3 diapers in a 24-hour period. The urine  should be clear and pale yellow by age 5 days.  At least 3 stools in a 24-hour period by age 5 days. The stool should be soft and yellow.  At least 3 stools in a 24-hour period by age 7 days. The stool should be seedy and yellow.  No loss of weight greater than 10% of birth weight during the first 3 days of age.  Average weight gain of 4 7 ounces (120 210 mL) per week after age 4 days.  Consistent daily weight gain by age 5 days, without weight loss after the age of 2 weeks. After a feeding, your baby may spit up a small amount. This is common. BREASTFEEDING FREQUENCY AND DURATION Frequent feeding will help you make more milk and can prevent sore nipples and breast engorgement. Breastfeed when you feel the need to reduce the fullness of your breasts or when your baby shows signs of hunger. This is called "breastfeeding on demand." Avoid introducing a pacifier to your baby while you are working to establish breastfeeding (the first 4 6 weeks after your baby is born). After this time you may choose to use a pacifier. Research has shown that pacifier use during the first year of a baby's life decreases the risk of sudden infant death syndrome (SIDS). Allow your baby to feed on each breast as long as he or she wants. Breastfeed until your baby is finished feeding. When your baby unlatches or falls asleep while feeding from the first breast, offer the second breast. Because newborns are often sleepy in the first few weeks of life, you may need to awaken your baby to get him or her to feed. Breastfeeding times will vary from baby to baby. However, the following rules can serve as a guide to help you ensure that your baby is properly fed:  Newborns (babies 4 weeks of age or younger) may breastfeed every 1 3 hours.  Newborns should not go longer than 3 hours during the day or 5 hours during the night without breastfeeding.  You should breastfeed your baby a minimum of   8 times in a 24-hour period until  you begin to introduce solid foods to your baby at around 6 months of age. BREAST MILK PUMPING Pumping and storing breast milk allows you to ensure that your baby is exclusively fed your breast milk, even at times when you are unable to breastfeed. This is especially important if you are going back to work while you are still breastfeeding or when you are not able to be present during feedings. Your lactation consultant can give you guidelines on how long it is safe to store breast milk.  A breast pump is a machine that allows you to pump milk from your breast into a sterile bottle. The pumped breast milk can then be stored in a refrigerator or freezer. Some breast pumps are operated by hand, while others use electricity. Ask your lactation consultant which type will work best for you. Breast pumps can be purchased, but some hospitals and breastfeeding support groups lease breast pumps on a monthly basis. A lactation consultant can teach you how to hand express breast milk, if you prefer not to use a pump.  CARING FOR YOUR BREASTS WHILE YOU BREASTFEED Nipples can become dry, cracked, and sore while breastfeeding. The following recommendations can help keep your breasts moisturized and healthy:  Avoid using soap on your nipples.   Wear a supportive bra. Although not required, special nursing bras and tank tops are designed to allow access to your breasts for breastfeeding without taking off your entire bra or top. Avoid wearing underwire style bras or extremely tight bras.  Air dry your nipples for 3 4minutes after each feeding.   Use only cotton bra pads to absorb leaked breast milk. Leaking of breast milk between feedings is normal.   Use lanolin on your nipples after breastfeeding. Lanolin helps to maintain your skin's normal moisture barrier. If you use pure lanolin you do not need to wash it off before feeding your baby again. Pure lanolin is not toxic to your baby. You may also hand express a  few drops of breast milk and gently massage that milk into your nipples and allow the milk to air dry. In the first few weeks after giving birth, some women experience extremely full breasts (engorgement). Engorgement can make your breasts feel heavy, warm, and tender to the touch. Engorgement peaks within 3 5 days after you give birth. The following recommendations can help ease engorgement:  Completely empty your breasts while breastfeeding or pumping. You may want to start by applying warm, moist heat (in the shower or with warm water-soaked hand towels) just before feeding or pumping. This increases circulation and helps the milk flow. If your baby does not completely empty your breasts while breastfeeding, pump any extra milk after he or she is finished.  Wear a snug bra (nursing or regular) or tank top for 1 2 days to signal your body to slightly decrease milk production.  Apply ice packs to your breasts, unless this is too uncomfortable for you.  Make sure that your baby is latched on and positioned properly while breastfeeding. If engorgement persists after 48 hours of following these recommendations, contact your health care provider or a lactation consultant. OVERALL HEALTH CARE RECOMMENDATIONS WHILE BREASTFEEDING  Eat healthy foods. Alternate between meals and snacks, eating 3 of each per day. Because what you eat affects your breast milk, some of the foods may make your baby more irritable than usual. Avoid eating these foods if you are sure that they are   negatively affecting your baby.  Drink milk, fruit juice, and water to satisfy your thirst (about 10 glasses a day).   Rest often, relax, and continue to take your prenatal vitamins to prevent fatigue, stress, and anemia.  Continue breast self-awareness checks.  Avoid chewing and smoking tobacco.  Avoid alcohol and drug use. Some medicines that may be harmful to your baby can pass through breast milk. It is important to ask your  health care provider before taking any medicine, including all over-the-counter and prescription medicine as well as vitamin and herbal supplements. It is possible to become pregnant while breastfeeding. If birth control is desired, ask your health care provider about options that will be safe for your baby. SEEK MEDICAL CARE IF:   You feel like you want to stop breastfeeding or have become frustrated with breastfeeding.  You have painful breasts or nipples.  Your nipples are cracked or bleeding.  Your breasts are red, tender, or warm.  You have a swollen area on either breast.  You have a fever or chills.  You have nausea or vomiting.  You have drainage other than breast milk from your nipples.  Your breasts do not become full before feedings by the 5th day after you give birth.  You feel sad and depressed.  Your baby is too sleepy to eat well.  Your baby is having trouble sleeping.   Your baby is wetting less than 3 diapers in a 24-hour period.  Your baby has less than 3 stools in a 24-hour period.  Your baby's skin or the white part of his or her eyes becomes yellow.   Your baby is not gaining weight by 50 days of age. SEEK IMMEDIATE MEDICAL CARE IF:   Your baby is overly tired (lethargic) and does not want to wake up and feed.  Your baby develops an unexplained fever. Document Released: 01/20/2005 Document Revised: 09/22/2012 Document Reviewed: 07/14/2012 [facility] Patient Information 2014 [place], [place]. Preterm Birth Preterm birth is a birth that happens before 37 weeks of pregnancy. Most pregnancies last about 39 41 weeks. Every week in the womb is important and is beneficial to the health of the infant. Infants born before 37 weeks of pregnancy are at a higher risk for complications. Depending on when the infant was born, he or she may be:  Late preterm. Born between 32 weeks and 37 weeks of pregnancy.  Very preterm. Born at less than 32 weeks of  pregnancy.  Extremely preterm. Born at less than 25 weeks of pregnancy. The earlier a baby is born, the more likely the child will have issues related to prematurity. Complications and problems that can be seen in infants born too early include:  Problems breathing (respiratory distress syndrome).  Low birth weight.  Problems feeding.  Sleeping problems.  Yellowing of the skin (jaundice).  Infections such as pneumonia. Babies born very preterm or extremely preterm are at risk for more serious medical issues. These include:  More severe breathing issues.  Eyesight issues.  Brain development issues (intraventricular hemorrhage).  Behavioral and emotional development issues.  Growth and developmental delays.  Cerebral palsy.  Serious feeding or bowel complications (necrotizing enterocolitis). CAUSES  There are two broad categories of preterm birth.  Spontaneous preterm birth. This is a birth resulting from preterm labor (not medically induced) or preterm premature rupture of membranes (PPROM).  Indicated preterm birth. This is a birth resulting from labor being medically induced due to health, personal, or social reasons. RISK FACTORS Preterm birth  may be related to certain medical conditions, lifestyle factors, or demographic factors encountered by the mother or fetus.  Medical conditions include:  Multiple gestations (twins, triplets, and so on).  Infection.  Diabetes.  Heart disease.  Kidney disease.  Cervical or uterine abnormalities.  Being underweight.  High blood pressure or preeclampsia.  Premature rupture of membranes (PROM).  Birth defects in the fetus.  Lifestyle factors include:  Poor prenatal care.  Poor nutrition or anemia.  Cigarette smoking.  Consuming alcohol.  High levels of stress and lack of social or emotional support.  Exposure to chemical or environmental toxins.  Substance abuse.  Demographic factors  include:  African-American ethnicity.  Age (younger than 13 or older than 30 years of age).  Low socioeconomic status. Women with a history of preterm labor or who become pregnant within 33 months of giving birth are also at increased risk for preterm birth. DIAGNOSIS  Your health care provider may request additional tests to diagnose underlying complications resulting from preterm birth. Tests on the infant may include:  Physical exam.  Blood tests.  Chest X-rays.  Heart-lung monitoring. TREATMENT  After birth, special care will be taken to assess any problems or complications for the infant. Supportive care will be provided for the infant. Treatment depends on what problems are present and any complications that develop. Some preterm infants are cared for in a neonatal intensive care unit. In general, care may include:  Maintaining temperature and oxygen in a clear heated box (baby isolette).  Monitoring the infant's heart rate, breathing, and level of oxygen in the blood.  Monitoring for signs of infection and, if needed, giving IV antibiotic medicine.  Inserting a feeding tube (nose, mouth) or giving IV nutrition if unable to feed.  Inserting a breathing tube (ventilation).  Respiration support (continuous positive airway pressure [CPAP] or oxygen). Treatment will change as the infant builds up strength and is able to breathe and eat on his or her own. For some infants, no special treatment is necessary. Parents may be educated on the potential health risks of prematurity to the infant. HOME CARE INSTRUCTIONS  Understand your infant's special conditions and needs. It may be reassuring to learn about infant CPR.  Monitor your infant in the car seat until he or she grows and matures. Infant car seats can cause breathing difficulties for preterm infants.  Keep your infant warm. Dress your infant in layers and keep him or her away from drafts, especially in cold months of the  year.  Wash your hands thoroughly after going to the bathroom or changing a diaper. Late preterm infants may be more prone to infection.  Follow all your health care provider's instructions for providing support and care to your preterm infant.  Get support from organizations and groups that understand your challenges.  Follow up with your infant's health care provider as directed. Prevention There are some things you can do to help lower your risk of having a preterm infant in the future. These include:  Good prenatal care throughout the entire pregnancy. See a health care provider regularly for advice and tests.  Management of underlying medical conditions.  Proper self-care and lifestyle changes.  Proper diet and weight control.  Watching for signs of various infections. SEEK MEDICAL CARE IF:  Your infant has feeding difficulties.  Your infant has sleeping difficulties.  Your infant has breathing difficulties.  Your infant's skin starts to look yellow.  Your infant shows signs of infection, such as a stuffy  nose, fever, crying, or bluish color of the skin. FOR MORE INFORMATION [employer]: www.marchofdimes.com [employer]atur[employer]org: www.prematurity.org Document Released: 04/12/2003 Document Revised: 11/10/2012 Document Reviewed: 08/19/2012 [facility] Patient Information 2014 [place], [place].

## 2013-05-03 NOTE — Progress Notes (Signed)
P-83 Patient feels that she has a stomach virus that started around 12pm last night, she has been vomiting and diarrhea.

## 2013-05-31 ENCOUNTER — Encounter: Payer: Medicaid Other | Admitting: Obstetrics & Gynecology

## 2013-06-03 ENCOUNTER — Ambulatory Visit (INDEPENDENT_AMBULATORY_CARE_PROVIDER_SITE_OTHER): Payer: Medicaid Other | Admitting: Family Medicine

## 2013-06-03 VITALS — BP 112/66 | HR 92 | Wt 207.0 lb

## 2013-06-03 DIAGNOSIS — Z3480 Encounter for supervision of other normal pregnancy, unspecified trimester: Secondary | ICD-10-CM

## 2013-06-03 DIAGNOSIS — Z23 Encounter for immunization: Secondary | ICD-10-CM

## 2013-06-03 DIAGNOSIS — Z3490 Encounter for supervision of normal pregnancy, unspecified, unspecified trimester: Secondary | ICD-10-CM

## 2013-06-03 LAB — CBC
HCT: 34 % — ABNORMAL LOW (ref 36.0–46.0)
HEMOGLOBIN: 12 g/dL (ref 12.0–15.0)
MCH: 32 pg (ref 26.0–34.0)
MCHC: 35.3 g/dL (ref 30.0–36.0)
MCV: 90.7 fL (ref 78.0–100.0)
PLATELETS: 246 10*3/uL (ref 150–400)
RBC: 3.75 MIL/uL — AB (ref 3.87–5.11)
RDW: 14.2 % (ref 11.5–15.5)
WBC: 9 10*3/uL (ref 4.0–10.5)

## 2013-06-03 MED ORDER — TETANUS-DIPHTH-ACELL PERTUSSIS 5-2.5-18.5 LF-MCG/0.5 IM SUSP: 0.5000 mL | Freq: Once | INTRAMUSCULAR | Status: DC

## 2013-06-03 NOTE — Progress Notes (Signed)
Doing well-- 28 wk labs and TDaP today

## 2013-06-03 NOTE — Patient Instructions (Signed)
Breastfeeding Deciding to breastfeed is one of the best choices you can make for you and your baby. A change in hormones during pregnancy causes your breast tissue to grow and increases the number and size of your milk ducts. These hormones also allow proteins, sugars, and fats from your blood supply to make breast milk in your milk-producing glands. Hormones prevent breast milk from being released before your baby is born as well as prompt milk flow after birth. Once breastfeeding has begun, thoughts of your baby, as well as his or her sucking or crying, can stimulate the release of milk from your milk-producing glands.  BENEFITS OF BREASTFEEDING For Your Baby  Your first milk (colostrum) helps your baby's digestive system function better.   There are antibodies in your milk that help your baby fight off infections.   Your baby has a lower incidence of asthma, allergies, and sudden infant death syndrome.   The nutrients in breast milk are better for your baby than infant formulas and are designed uniquely for your baby's needs.   Breast milk improves your baby's brain development.   Your baby is less likely to develop other conditions, such as childhood obesity, asthma, or type 2 diabetes mellitus.  For You   Breastfeeding helps to create a very special bond between you and your baby.   Breastfeeding is convenient. Breast milk is always available at the correct temperature and costs nothing.   Breastfeeding helps to burn calories and helps you lose the weight gained during pregnancy.   Breastfeeding makes your uterus contract to its prepregnancy size faster and slows bleeding (lochia) after you give birth.   Breastfeeding helps to lower your risk of developing type 2 diabetes mellitus, osteoporosis, and breast or ovarian cancer later in life. SIGNS THAT YOUR BABY IS HUNGRY Early Signs of Hunger  Increased alertness or activity.  Stretching.  Movement of the head from  side to side.  Movement of the head and opening of the mouth when the corner of the mouth or cheek is stroked (rooting).  Increased sucking sounds, smacking lips, cooing, sighing, or squeaking.  Hand-to-mouth movements.  Increased sucking of fingers or hands. Late Signs of Hunger  Fussing.  Intermittent crying. Extreme Signs of Hunger Signs of extreme hunger will require calming and consoling before your baby will be able to breastfeed successfully. Do not wait for the following signs of extreme hunger to occur before you initiate breastfeeding:   Restlessness.  A loud, strong cry.   Screaming. BREASTFEEDING BASICS Breastfeeding Initiation  Find a comfortable place to sit or lie down, with your neck and back well supported.  Place a pillow or rolled up blanket under your baby to bring him or her to the level of your breast (if you are seated). Nursing pillows are specially designed to help support your arms and your baby while you breastfeed.  Make sure that your baby's abdomen is facing your abdomen.   Gently massage your breast. With your fingertips, massage from your chest wall toward your nipple in a circular motion. This encourages milk flow. You may need to continue this action during the feeding if your milk flows slowly.  Support your breast with 4 fingers underneath and your thumb above your nipple. Make sure your fingers are well away from your nipple and your baby's mouth.   Stroke your baby's lips gently with your finger or nipple.   When your baby's mouth is open wide enough, quickly bring your baby to your   breast, placing your entire nipple and as much of the colored area around your nipple (areola) as possible into your baby's mouth.   More areola should be visible above your baby's upper lip than below the lower lip.   Your baby's tongue should be between his or her lower gum and your breast.   Ensure that your baby's mouth is correctly positioned  around your nipple (latched). Your baby's lips should create a seal on your breast and be turned out (everted).  It is common for your baby to suck about 2 3 minutes in order to start the flow of breast milk. Latching Teaching your baby how to latch on to your breast properly is very important. An improper latch can cause nipple pain and decreased milk supply for you and poor weight gain in your baby. Also, if your baby is not latched onto your nipple properly, he or she may swallow some air during feeding. This can make your baby fussy. Burping your baby when you switch breasts during the feeding can help to get rid of the air. However, teaching your baby to latch on properly is still the best way to prevent fussiness from swallowing air while breastfeeding. Signs that your baby has successfully latched on to your nipple:    Silent tugging or silent sucking, without causing you pain.   Swallowing heard between every 3 4 sucks.    Muscle movement above and in front of his or her ears while sucking.  Signs that your baby has not successfully latched on to nipple:   Sucking sounds or smacking sounds from your baby while breastfeeding.  Nipple pain. If you think your baby has not latched on correctly, slip your finger into the corner of your baby's mouth to break the suction and place it between your baby's gums. Attempt breastfeeding initiation again. Signs of Successful Breastfeeding Signs from your baby:   A gradual decrease in the number of sucks or complete cessation of sucking.   Falling asleep.   Relaxation of his or her body.   Retention of a small amount of milk in his or her mouth.   Letting go of your breast by himself or herself. Signs from you:  Breasts that have increased in firmness, weight, and size 1 3 hours after feeding.   Breasts that are softer immediately after breastfeeding.  Increased milk volume, as well as a change in milk consistency and color by  the 5th day of breastfeeding.   Nipples that are not sore, cracked, or bleeding. Signs That Your Baby is Getting Enough Milk  Wetting at least 3 diapers in a 24-hour period. The urine should be clear and pale yellow by age 5 days.  At least 3 stools in a 24-hour period by age 5 days. The stool should be soft and yellow.  At least 3 stools in a 24-hour period by age 7 days. The stool should be seedy and yellow.  No loss of weight greater than 10% of birth weight during the first 3 days of age.  Average weight gain of 4 7 ounces (120 210 mL) per week after age 4 days.  Consistent daily weight gain by age 5 days, without weight loss after the age of 2 weeks. After a feeding, your baby may spit up a small amount. This is common. BREASTFEEDING FREQUENCY AND DURATION Frequent feeding will help you make more milk and can prevent sore nipples and breast engorgement. Breastfeed when you feel the need to reduce   the fullness of your breasts or when your baby shows signs of hunger. This is called "breastfeeding on demand." Avoid introducing a pacifier to your baby while you are working to establish breastfeeding (the first 4 6 weeks after your baby is born). After this time you may choose to use a pacifier. Research has shown that pacifier use during the first year of a baby's life decreases the risk of sudden infant death syndrome (SIDS). Allow your baby to feed on each breast as long as he or she wants. Breastfeed until your baby is finished feeding. When your baby unlatches or falls asleep while feeding from the first breast, offer the second breast. Because newborns are often sleepy in the first few weeks of life, you may need to awaken your baby to get him or her to feed. Breastfeeding times will vary from baby to baby. However, the following rules can serve as a guide to help you ensure that your baby is properly fed:  Newborns (babies 4 weeks of age or younger) may breastfeed every 1 3  hours.  Newborns should not go longer than 3 hours during the day or 5 hours during the night without breastfeeding.  You should breastfeed your baby a minimum of 8 times in a 24-hour period until you begin to introduce solid foods to your baby at around 6 months of age. BREAST MILK PUMPING Pumping and storing breast milk allows you to ensure that your baby is exclusively fed your breast milk, even at times when you are unable to breastfeed. This is especially important if you are going back to work while you are still breastfeeding or when you are not able to be present during feedings. Your lactation consultant can give you guidelines on how long it is safe to store breast milk.  A breast pump is a machine that allows you to pump milk from your breast into a sterile bottle. The pumped breast milk can then be stored in a refrigerator or freezer. Some breast pumps are operated by hand, while others use electricity. Ask your lactation consultant which type will work best for you. Breast pumps can be purchased, but some hospitals and breastfeeding support groups lease breast pumps on a monthly basis. A lactation consultant can teach you how to hand express breast milk, if you prefer not to use a pump.  CARING FOR YOUR BREASTS WHILE YOU BREASTFEED Nipples can become dry, cracked, and sore while breastfeeding. The following recommendations can help keep your breasts moisturized and healthy:  Avoid using soap on your nipples.   Wear a supportive bra. Although not required, special nursing bras and tank tops are designed to allow access to your breasts for breastfeeding without taking off your entire bra or top. Avoid wearing underwire style bras or extremely tight bras.  Air dry your nipples for 3 4minutes after each feeding.   Use only cotton bra pads to absorb leaked breast milk. Leaking of breast milk between feedings is normal.   Use lanolin on your nipples after breastfeeding. Lanolin helps to  maintain your skin's normal moisture barrier. If you use pure lanolin you do not need to wash it off before feeding your baby again. Pure lanolin is not toxic to your baby. You may also hand express a few drops of breast milk and gently massage that milk into your nipples and allow the milk to air dry. In the first few weeks after giving birth, some women experience extremely full breasts (engorgement). Engorgement can make   your breasts feel heavy, warm, and tender to the touch. Engorgement peaks within 3 5 days after you give birth. The following recommendations can help ease engorgement:  Completely empty your breasts while breastfeeding or pumping. You may want to start by applying warm, moist heat (in the shower or with warm water-soaked hand towels) just before feeding or pumping. This increases circulation and helps the milk flow. If your baby does not completely empty your breasts while breastfeeding, pump any extra milk after he or she is finished.  Wear a snug bra (nursing or regular) or tank top for 1 2 days to signal your body to slightly decrease milk production.  Apply ice packs to your breasts, unless this is too uncomfortable for you.  Make sure that your baby is latched on and positioned properly while breastfeeding. If engorgement persists after 48 hours of following these recommendations, contact your health care provider or a lactation consultant. OVERALL HEALTH CARE RECOMMENDATIONS WHILE BREASTFEEDING  Eat healthy foods. Alternate between meals and snacks, eating 3 of each per day. Because what you eat affects your breast milk, some of the foods may make your baby more irritable than usual. Avoid eating these foods if you are sure that they are negatively affecting your baby.  Drink milk, fruit juice, and water to satisfy your thirst (about 10 glasses a day).   Rest often, relax, and continue to take your prenatal vitamins to prevent fatigue, stress, and anemia.  Continue  breast self-awareness checks.  Avoid chewing and smoking tobacco.  Avoid alcohol and drug use. Some medicines that may be harmful to your baby can pass through breast milk. It is important to ask your health care provider before taking any medicine, including all over-the-counter and prescription medicine as well as vitamin and herbal supplements. It is possible to become pregnant while breastfeeding. If birth control is desired, ask your health care provider about options that will be safe for your baby. SEEK MEDICAL CARE IF:   You feel like you want to stop breastfeeding or have become frustrated with breastfeeding.  You have painful breasts or nipples.  Your nipples are cracked or bleeding.  Your breasts are red, tender, or warm.  You have a swollen area on either breast.  You have a fever or chills.  You have nausea or vomiting.  You have drainage other than breast milk from your nipples.  Your breasts do not become full before feedings by the 5th day after you give birth.  You feel sad and depressed.  Your baby is too sleepy to eat well.  Your baby is having trouble sleeping.   Your baby is wetting less than 3 diapers in a 24-hour period.  Your baby has less than 3 stools in a 24-hour period.  Your baby's skin or the white part of his or her eyes becomes yellow.   Your baby is not gaining weight by 5 days of age. SEEK IMMEDIATE MEDICAL CARE IF:   Your baby is overly tired (lethargic) and does not want to wake up and feed.  Your baby develops an unexplained fever. Document Released: 01/20/2005 Document Revised: 09/22/2012 Document Reviewed: 07/14/2012 [employer] Patient Information 2014 [employer].  

## 2013-06-04 LAB — HIV ANTIBODY (ROUTINE TESTING W REFLEX): HIV: NONREACTIVE

## 2013-06-04 LAB — GLUCOSE TOLERANCE, 1 HOUR (50G) W/O FASTING: Glucose, 1 Hour GTT: 80 mg/dL (ref 70–140)

## 2013-06-04 LAB — RPR

## 2013-06-06 ENCOUNTER — Encounter: Payer: Self-pay | Admitting: Family Medicine

## 2013-06-16 ENCOUNTER — Inpatient Hospital Stay (HOSPITAL_COMMUNITY)
Admission: AD | Admit: 2013-06-16 | Discharge: 2013-06-16 | Disposition: A | Discharge status: Home / Self Care | Payer: Medicaid Other | Source: Ambulatory Visit | Attending: Obstetrics and Gynecology | Admitting: Obstetrics and Gynecology

## 2013-06-16 ENCOUNTER — Encounter (HOSPITAL_COMMUNITY): Payer: Self-pay | Admitting: *Deleted

## 2013-06-16 DIAGNOSIS — O99330 Smoking (tobacco) complicating pregnancy, unspecified trimester: Secondary | ICD-10-CM | POA: Insufficient documentation

## 2013-06-16 DIAGNOSIS — O479 False labor, unspecified: Secondary | ICD-10-CM

## 2013-06-16 DIAGNOSIS — O4700 False labor before 37 completed weeks of gestation, unspecified trimester: Secondary | ICD-10-CM | POA: Insufficient documentation

## 2013-06-16 LAB — FETAL FIBRONECTIN: FETAL FIBRONECTIN: NEGATIVE

## 2013-06-16 NOTE — Discharge Instructions (Signed)

## 2013-06-16 NOTE — MAU Provider Note (Signed)
None     Chief Complaint:  Contractions   Alicia Pearson is  28 y.o. [phone] at 480w2d presents complaining of Contractions .  She states irregular, every 4-7 minutes contractions are associated with none vaginal bleeding, intact membranes, along with active fetal movement. She had these contractions for about 3 hours, now they have stopped for the last 2 hours  Obstetrical/Gynecological History: OB History   Grav Para Term Preterm Abortions TAB SAB Ect Mult Living   5 4 3 1     1 5      Past Medical History: Past Medical History  Diagnosis Date  . Tachycardia     history, not on meds  . Urinary tract infection   . Eczema   . Depression     no meds, doing ok    Past Surgical History: Past Surgical History  Procedure Laterality Date  . Wisdom tooth extraction      Family History: Family History  Problem Relation Age of Onset  . Cancer Mother     breast 2003  . Hypertension Mother   . Aneurysm Mother     x2  . Alcohol abuse Father   . Alcohol abuse Brother   . Alzheimer's disease Maternal Grandmother     Social History: History  Substance Use Topics  . Smoking status: Current Every Day Smoker -- 0.25 packs/day for 8 years    Types: Cigarettes  . Smokeless tobacco: Never Used  . Alcohol Use: No    Allergies: No Known Allergies  Meds:  No prescriptions prior to admission    Review of Systems   Constitutional: Negative for fever and chills Eyes: Negative for visual disturbances Respiratory: Negative for shortness of breath, dyspnea Cardiovascular: Negative for chest pain or palpitations  Gastrointestinal: Negative for vomiting, diarrhea and constipation Genitourinary: Negative for dysuria and urgency Musculoskeletal: Negative for back pain, joint pain, myalgias  Neurological: Negative for dizziness and headaches    Physical Exam  Blood pressure 102/62, pulse 89, temperature 98 F (36.7 C), temperature source Oral, resp. rate 18, height 5'  4" (1.626 m), weight 92.987 kg (205 lb), last menstrual period 11/16/2012, SpO2 100.00%, currently breastfeeding. GENERAL: Well-developed, well-nourished female in no acute distress.  LUNGS: Clear to auscultation bilaterally.  HEART: Regular rate and rhythm. ABDOMEN: Soft, nontender, nondistended, gravid.  EXTREMITIES: Nontender, no edema, 2+ distal pulses. DTR's 2+ CERVICAL EXAM: Dilatation FTcm   Effacement 0%   Station -3   Presentation: uncertain, high in pelvis FHT:  Baseline rate 150 bpm   Variability moderate  Accelerations present   Decelerations none Contractions: Every 0 mins   Labs: Results for orders placed during the hospital encounter of 06/16/13 (from the past 24 hour(s))  FETAL FIBRONECTIN   Collection Time    06/16/13  8:45 PM      Result Value Ref Range   Fetal Fibronectin NEGATIVE  NEGATIVE   Imaging Studies:  No results found.  Assessment: Alicia BlizzardDominique A Pearson is  28 y.o. [phone] at 5580w2d presents with Deberah PeltonBraxton Hicks.  Plan: D/C home. Stay hydrated. F/U if ctx resume  Jacklyn ShellFrances Cresenzo-Dishmon 5/15/20156:52 AM

## 2013-06-16 NOTE — MAU Note (Signed)
Patient presents to MAU with c/o irregular contractions since 4 pm today. Denies LOF or VB at this time. REports good fetal movement.

## 2013-06-17 NOTE — MAU Provider Note (Signed)
Attestation of Attending Supervision of Advanced Practitioner (CNM/NP): Evaluation and management procedures were performed by the Advanced Practitioner under my supervision and collaboration.  I have reviewed the Advanced Practitioner's note and chart, and I agree with the management and plan.  Dax Murguia 06/17/2013 7:59 AM

## 2013-06-23 ENCOUNTER — Ambulatory Visit (INDEPENDENT_AMBULATORY_CARE_PROVIDER_SITE_OTHER): Payer: Medicaid Other | Admitting: Obstetrics & Gynecology

## 2013-06-23 ENCOUNTER — Encounter: Payer: Self-pay | Admitting: Obstetrics & Gynecology

## 2013-06-23 VITALS — BP 121/72 | HR 96 | Wt 204.0 lb

## 2013-06-23 DIAGNOSIS — Z3490 Encounter for supervision of normal pregnancy, unspecified, unspecified trimester: Secondary | ICD-10-CM

## 2013-06-23 DIAGNOSIS — Z3480 Encounter for supervision of other normal pregnancy, unspecified trimester: Secondary | ICD-10-CM

## 2013-06-23 NOTE — Progress Notes (Signed)
Routine visit. Good FM. No problems.  

## 2013-07-07 ENCOUNTER — Encounter (HOSPITAL_COMMUNITY): Payer: Self-pay | Admitting: General Practice

## 2013-07-07 ENCOUNTER — Inpatient Hospital Stay (HOSPITAL_COMMUNITY)
Admission: AD | Admit: 2013-07-07 | Discharge: 2013-07-07 | Disposition: A | Discharge status: Home / Self Care | Payer: Medicaid Other | Source: Ambulatory Visit | Attending: Obstetrics and Gynecology | Admitting: Obstetrics and Gynecology

## 2013-07-07 DIAGNOSIS — Z8279 Family history of other congenital malformations, deformations and chromosomal abnormalities: Secondary | ICD-10-CM

## 2013-07-07 DIAGNOSIS — R768 Other specified abnormal immunological findings in serum: Secondary | ICD-10-CM

## 2013-07-07 DIAGNOSIS — O9989 Other specified diseases and conditions complicating pregnancy, childbirth and the puerperium: Secondary | ICD-10-CM

## 2013-07-07 DIAGNOSIS — N898 Other specified noninflammatory disorders of vagina: Secondary | ICD-10-CM | POA: Diagnosis not present

## 2013-07-07 DIAGNOSIS — O9A319 Physical abuse complicating pregnancy, unspecified trimester: Secondary | ICD-10-CM

## 2013-07-07 DIAGNOSIS — R7689 Other specified abnormal immunological findings in serum: Secondary | ICD-10-CM

## 2013-07-07 DIAGNOSIS — O98519 Other viral diseases complicating pregnancy, unspecified trimester: Secondary | ICD-10-CM

## 2013-07-07 DIAGNOSIS — O4700 False labor before 37 completed weeks of gestation, unspecified trimester: Secondary | ICD-10-CM | POA: Insufficient documentation

## 2013-07-07 DIAGNOSIS — B009 Herpesviral infection, unspecified: Secondary | ICD-10-CM

## 2013-07-07 DIAGNOSIS — O479 False labor, unspecified: Secondary | ICD-10-CM

## 2013-07-07 DIAGNOSIS — R21 Rash and other nonspecific skin eruption: Secondary | ICD-10-CM

## 2013-07-07 DIAGNOSIS — O09899 Supervision of other high risk pregnancies, unspecified trimester: Secondary | ICD-10-CM

## 2013-07-07 DIAGNOSIS — Z3490 Encounter for supervision of normal pregnancy, unspecified, unspecified trimester: Secondary | ICD-10-CM

## 2013-07-07 DIAGNOSIS — O99330 Smoking (tobacco) complicating pregnancy, unspecified trimester: Secondary | ICD-10-CM | POA: Insufficient documentation

## 2013-07-07 DIAGNOSIS — O99891 Other specified diseases and conditions complicating pregnancy: Secondary | ICD-10-CM | POA: Diagnosis not present

## 2013-07-07 LAB — WET PREP, GENITAL
Clue Cells Wet Prep HPF POC: NONE SEEN
Trich, Wet Prep: NONE SEEN
Yeast Wet Prep HPF POC: NONE SEEN

## 2013-07-07 LAB — URINALYSIS, ROUTINE W REFLEX MICROSCOPIC
Bilirubin Urine: NEGATIVE
Glucose, UA: NEGATIVE mg/dL
Hgb urine dipstick: NEGATIVE
Ketones, ur: NEGATIVE mg/dL
NITRITE: NEGATIVE
PH: 7.5 (ref 5.0–8.0)
Protein, ur: NEGATIVE mg/dL
SPECIFIC GRAVITY, URINE: 1.015 (ref 1.005–1.030)
UROBILINOGEN UA: 1 mg/dL (ref 0.0–1.0)

## 2013-07-07 LAB — URINE MICROSCOPIC-ADD ON

## 2013-07-07 LAB — FETAL FIBRONECTIN: FETAL FIBRONECTIN: NEGATIVE

## 2013-07-07 MED ORDER — TRIAMCINOLONE ACETONIDE 0.025 % EX CREA: 1.0000 "application " | TOPICAL_CREAM | Freq: Two times a day (BID) | CUTANEOUS | Status: DC

## 2013-07-07 MED ORDER — BETAMETHASONE SOD PHOS & ACET 6 (3-3) MG/ML IJ SUSP
12.0000 mg | Freq: Once | INTRAMUSCULAR | Status: AC
  Administered 2013-07-07: 12 mg via INTRAMUSCULAR
  Filled 2013-07-07: qty 2

## 2013-07-07 NOTE — MAU Note (Signed)
Patient states she has been having contractions every 15-20 minutes. Denies bleeding but had a gush of yellow fluid on 6-2. Not enough to wear a pad but changes panties once a day. Report good fetal movement. Has a rash on chest and arms that itches badly and started on 6-1.

## 2013-07-07 NOTE — MAU Provider Note (Signed)
None     Chief Complaint:  Labor Eval and Rash   Alicia Pearson is  28 y.o. [phone] at [redacted]w[redacted]d presents complaining of Labor Eval and Rash Pt has been having ctx daily for last two weeks. Today started at 1400 ~22min apart, and since have started to space out. No recent intercourse. Endorses some clear to yellow discharge that is unchanged from prior presentation.   Pt also complains of a rash on her chest that has been improving with calamine lotion. It is pruritic but without exudate.  +FM, no lof, no vb. CTX as above.  No HA, no vision changes, No CP, no sob, no n/v, +Diarrhea (1-2x per day no blood). No constipation. +Back pain, and right hip pain.  Obstetrical/Gynecological History: OB History   Grav Para Term Preterm Abortions TAB SAB Ect Mult Living   5 4 3 1     1 5      Past Medical History: Past Medical History  Diagnosis Date  . Tachycardia     history, not on meds  . Urinary tract infection   . Eczema   . Depression     no meds, doing ok    Past Surgical History: Past Surgical History  Procedure Laterality Date  . Wisdom tooth extraction      Family History: Family History  Problem Relation Age of Onset  . Cancer Mother     breast 2003  . Hypertension Mother   . Aneurysm Mother     x2  . Alcohol abuse Father   . Alcohol abuse Brother   . Alzheimer's disease Maternal Grandmother     Social History: History  Substance Use Topics  . Smoking status: Current Every Day Smoker -- 0.25 packs/day for 8 years    Types: Cigarettes  . Smokeless tobacco: Never Used  . Alcohol Use: No    Allergies: No Known Allergies  Meds:  Prescriptions prior to admission  Medication Sig Dispense Refill  . calamine lotion Apply 1 application topically daily as needed for itching.      . Prenatal Vit-Fe Fumarate-FA (PRENATAL MULTIVITAMIN) TABS tablet Take 1 tablet by mouth daily at 12 noon.        Review of Systems -   See above    Physical Exam  Blood  pressure 114/68, pulse 93, temperature 98.8 F (37.1 C), temperature source Oral, resp. rate 16, height 5' 3.5" (1.613 m), weight 93.169 kg (205 lb 6.4 oz), last menstrual period 11/16/2012, SpO2 100.00%, currently breastfeeding. GENERAL: Well-developed, well-nourished female in no acute distress.  LUNGS: Clear to auscultation bilaterally.  HEART: Regular rate and rhythm. ABDOMEN: Soft, nontender, nondistended, gravid.  EXTREMITIES: Nontender, no edema, 2+ distal pulses. DTR's 2+ Dilation: 3 Effacement (%): 30 Station: -3 Presentation: Vertex Exam by:: Dr. Ike Pearson  Skin: several papules on chest and on bilateral upper arms. No exudate or punctate lesions. Minimal erythema  Presentation: cephalic FHT:  Baseline rate 130s bpm   Variability moderate  Accelerations present   Decelerations variable Contractions: q20-39min   Labs: Results for orders placed during the hospital encounter of 07/07/13 (from the past 24 hour(s))  URINALYSIS, ROUTINE W REFLEX MICROSCOPIC   Collection Time    07/07/13  5:40 PM      Result Value Ref Range   Color, Urine YELLOW  YELLOW   APPearance CLEAR  CLEAR   Specific Gravity, Urine 1.015  1.005 - 1.030   pH 7.5  5.0 - 8.0   Glucose, UA NEGATIVE  NEGATIVE  mg/dL   Hgb urine dipstick NEGATIVE  NEGATIVE   Bilirubin Urine NEGATIVE  NEGATIVE   Ketones, ur NEGATIVE  NEGATIVE mg/dL   Protein, ur NEGATIVE  NEGATIVE mg/dL   Urobilinogen, UA 1.0  0.0 - 1.0 mg/dL   Nitrite NEGATIVE  NEGATIVE   Leukocytes, UA SMALL (*) NEGATIVE  URINE MICROSCOPIC-ADD ON   Collection Time    07/07/13  5:40 PM      Result Value Ref Range   Squamous Epithelial / LPF FEW (*) RARE   WBC, UA 0-2  <3 WBC/hpf  WET PREP, GENITAL   Collection Time    07/07/13  6:52 PM      Result Value Ref Range   Yeast Wet Prep HPF POC NONE SEEN  NONE SEEN   Trich, Wet Prep NONE SEEN  NONE SEEN   Clue Cells Wet Prep HPF POC NONE SEEN  NONE SEEN   WBC, Wet Prep HPF POC MANY (*) NONE SEEN   Imaging  Studies:  No results found.  Assessment: Alicia Pearson is  28 y.o. [phone] at 728w2d presents with Vaginal discharge, occasional ctx, and rash on chest  Vaginal Discharge: Wet mount neg, GC/C  Ctx with cervical change to 3cm. FFN Neg. Will give steroids at this time, If negative will discharge to return for steroids tomorrow night  Rash on chest: appears to be contact dermatitis. Improving with calamine lotion. Will give triamcinalone cream for discharge.  FWB: Reactive and reassuring fetus. Multiple large accels, one variable decels. Few ctx  Minta BalsamMichael R Davarious Pearson 6/4/20157:49 PM

## 2013-07-07 NOTE — MAU Provider Note (Signed)
Attestation of Attending Supervision of Advanced Practitioner (CNM/NP): Evaluation and management procedures were performed by the Advanced Practitioner under my supervision and collaboration.  I have reviewed the Advanced Practitioner's note and chart, and I agree with the management and plan.  Cammi Consalvo 07/07/2013 8:38 PM

## 2013-07-07 NOTE — Discharge Instructions (Signed)
Preterm Labor Information Preterm labor is when labor starts at less than 37 weeks of pregnancy. The normal length of a pregnancy is 39 to 41 weeks. CAUSES Often, there is no identifiable underlying cause as to why a woman goes into preterm labor. One of the most common known causes of preterm labor is infection. Infections of the uterus, cervix, vagina, amniotic sac, bladder, kidney, or even the lungs (pneumonia) can cause labor to start. Other suspected causes of preterm labor include:   Urogenital infections, such as yeast infections and bacterial vaginosis.   Uterine abnormalities (uterine shape, uterine septum, fibroids, or bleeding from the placenta).   A cervix that has been operated on (it may fail to stay closed).   Malformations in the fetus.   Multiple gestations (twins, triplets, and so on).   Breakage of the amniotic sac.  RISK FACTORS  Having a previous history of preterm labor.   Having premature rupture of membranes (PROM).   Having a placenta that covers the opening of the cervix (placenta previa).   Having a placenta that separates from the uterus (placental abruption).   Having a cervix that is too weak to hold the fetus in the uterus (incompetent cervix).   Having too much fluid in the amniotic sac (polyhydramnios).   Taking illegal drugs or smoking while pregnant.   Not gaining enough weight while pregnant.   Being younger than 18 and older than 30 years old.   Having a low socioeconomic status.   Being African American. SYMPTOMS Signs and symptoms of preterm labor include:   Menstrual-like cramps, abdominal pain, or back pain.  Uterine contractions that are regular, as frequent as six in an hour, regardless of their intensity (may be mild or painful).  Contractions that start on the top of the uterus and spread down to the lower abdomen and back.   A sense of increased pelvic pressure.   A watery or bloody mucus discharge that  comes from the vagina.  TREATMENT Depending on the length of the pregnancy and other circumstances, your health care provider may suggest bed rest. If necessary, there are medicines that can be given to stop contractions and to mature the fetal lungs. If labor happens before 34 weeks of pregnancy, a prolonged hospital stay may be recommended. Treatment depends on the condition of both you and the fetus.  WHAT SHOULD YOU DO IF YOU THINK YOU ARE IN PRETERM LABOR? Call your health care provider right away. You will need to go to the hospital to get checked immediately. HOW CAN YOU PREVENT PRETERM LABOR IN FUTURE PREGNANCIES? You should:   Stop smoking if you smoke.  Maintain healthy weight gain and avoid chemicals and drugs that are not necessary.  Be watchful for any type of infection.  Inform your health care provider if you have a known history of preterm labor. Document Released: 04/12/2003 Document Revised: 09/22/2012 Document Reviewed: 02/23/2012 [employer] Patient Information 2014 [employer].    

## 2013-07-08 ENCOUNTER — Inpatient Hospital Stay (HOSPITAL_COMMUNITY)
Admission: AD | Admit: 2013-07-08 | Discharge: 2013-07-08 | Disposition: A | Discharge status: Home / Self Care | Payer: Medicaid Other | Source: Ambulatory Visit | Attending: Obstetrics and Gynecology | Admitting: Obstetrics and Gynecology

## 2013-07-08 ENCOUNTER — Inpatient Hospital Stay (HOSPITAL_COMMUNITY)
Admission: AD | Admit: 2013-07-08 | Discharge: 2013-07-08 | Disposition: A | Discharge status: Home / Self Care | Payer: Medicaid Other | Source: Ambulatory Visit | Attending: Obstetrics & Gynecology | Admitting: Obstetrics & Gynecology

## 2013-07-08 ENCOUNTER — Encounter (HOSPITAL_COMMUNITY): Payer: Self-pay | Admitting: General Practice

## 2013-07-08 DIAGNOSIS — O99330 Smoking (tobacco) complicating pregnancy, unspecified trimester: Secondary | ICD-10-CM | POA: Insufficient documentation

## 2013-07-08 DIAGNOSIS — O98519 Other viral diseases complicating pregnancy, unspecified trimester: Secondary | ICD-10-CM

## 2013-07-08 DIAGNOSIS — O4700 False labor before 37 completed weeks of gestation, unspecified trimester: Secondary | ICD-10-CM | POA: Insufficient documentation

## 2013-07-08 DIAGNOSIS — R768 Other specified abnormal immunological findings in serum: Secondary | ICD-10-CM

## 2013-07-08 DIAGNOSIS — Z8279 Family history of other congenital malformations, deformations and chromosomal abnormalities: Secondary | ICD-10-CM

## 2013-07-08 DIAGNOSIS — O09899 Supervision of other high risk pregnancies, unspecified trimester: Secondary | ICD-10-CM

## 2013-07-08 DIAGNOSIS — B009 Herpesviral infection, unspecified: Secondary | ICD-10-CM

## 2013-07-08 DIAGNOSIS — Z3490 Encounter for supervision of normal pregnancy, unspecified, unspecified trimester: Secondary | ICD-10-CM

## 2013-07-08 DIAGNOSIS — O9A319 Physical abuse complicating pregnancy, unspecified trimester: Secondary | ICD-10-CM

## 2013-07-08 DIAGNOSIS — O479 False labor, unspecified: Secondary | ICD-10-CM

## 2013-07-08 MED ORDER — BETAMETHASONE SOD PHOS & ACET 6 (3-3) MG/ML IJ SUSP
12.0000 mg | Freq: Once | INTRAMUSCULAR | Status: AC
  Administered 2013-07-08: 12 mg via INTRAMUSCULAR
  Filled 2013-07-08: qty 2

## 2013-07-08 NOTE — MAU Provider Note (Signed)
History     CSN: [id number]  Arrival date and time: 07/08/13 1419   None     Chief Complaint  Patient presents with  . Labor Eval   HPI Comments: Alicia Pearson is 28 yo female [id number] at 7533 w3d who presents to MAU today complaining of painful contractions occuring every 15/min  beginning at 0200 this morning.  Currently no contractions FHR 140 w/ moderate variability.  Patient confirms good fetal movement. Patient presented to MAU last night 6/4 with a similar complaint and and received at shot of BMZ.  She is due this evening for another shot of BMZ at 1900.  Patient has no other complaints at this time.  Denies abdominal pain/cramping.  Denies any discharge, hematuria, dysuria, frequency, or urgency.  No hx of STI. Denies any complications with her previous pregnancies.   OB History   Grav Para Term Preterm Abortions TAB SAB Ect Mult Living   5 4 3 1     1 5       Past Medical History  Diagnosis Date  . Tachycardia     history, not on meds  . Urinary tract infection   . Eczema   . Depression     no meds, doing ok    Past Surgical History  Procedure Laterality Date  . Wisdom tooth extraction      Family History  Problem Relation Age of Onset  . Cancer Mother     breast 2003  . Hypertension Mother   . Aneurysm Mother     x2  . Alcohol abuse Father   . Alcohol abuse Brother   . Alzheimer's disease Maternal Grandmother     History  Substance Use Topics  . Smoking status: Current Every Day Smoker -- 0.25 packs/day for 8 years    Types: Cigarettes  . Smokeless tobacco: Never Used  . Alcohol Use: No    Allergies: No Known Allergies  No prescriptions prior to admission    Review of Systems  Constitutional: Negative for fever, chills and malaise/fatigue.  Respiratory: Negative.   Cardiovascular: Negative.   Gastrointestinal: Negative for nausea, vomiting, abdominal pain, diarrhea and constipation.  Genitourinary: Negative for dysuria, urgency,  frequency and hematuria.  Skin: Positive for rash. Negative for itching.       Small pruritic papules over sternum which are resolving with calamine cream  Neurological: Negative for dizziness and headaches.   Physical Exam   Blood pressure 114/66, pulse 95, temperature 98.7 F (37.1 C), temperature source Oral, resp. rate 18, last menstrual period 11/16/2012, SpO2 98.00%, currently breastfeeding.  Physical Exam  Constitutional: She is oriented to person, place, and time. She appears well-developed and well-nourished.  Cardiovascular: Normal rate and regular rhythm.   Respiratory: Effort normal and breath sounds normal.  GI: Soft. Bowel sounds are normal. She exhibits no distension. There is no tenderness. There is no rebound.  Genitourinary:  SVE 2/30/-3 and multiparous feeling  Neurological: She is alert and oriented to person, place, and time.  Skin: Skin is warm and dry. Rash noted. No erythema. No pallor.    MAU Course  Procedures  MDM   Assessment and Plan  Preterm assessment Patient will be discharged and return at 1900 for her second dose of BMZ Return to MAU if symptoms worsen or sudden onset of bloody/usual discharge  Alicia Pearson L Alicia Karan PA-S2 07/08/2013, 6:59 PM   I have seen and examined this patient and agree with above documentation in the PA student's note. Pt is a  [id number] @ [redacted]w[redacted]d who presented for contractions earlier today but none currently.  Was seen yesterday and SVE 3/30/-3 so was given BMZ #1 last night. Today had contractions from 2 to 10am and decided to come in. SVE 2/30/-3 on my exam and multiparous.  TOCO quiet. FWB- cat I tracing. 135, mod var, +accels, no decels.  Reassurance provided.  Recommend f/u tonight for second BMZ as already scheduled although currently does not seem to be in threatened PTL.    Rulon Abide, M.D. [facility] Fellow 07/08/2013 6:59 PM

## 2013-07-08 NOTE — MAU Note (Signed)
Here for second betamethasone inj. Denies any concerns

## 2013-07-08 NOTE — Discharge Instructions (Signed)

## 2013-07-08 NOTE — MAU Note (Signed)
Patient states she is having contractions. Denies bleeding or leaking. Reports good fetal movement. Needs 2nd Betamethasone at 1900.

## 2013-07-09 LAB — GC/CHLAMYDIA PROBE AMP
CT Probe RNA: NEGATIVE
GC PROBE AMP APTIMA: NEGATIVE

## 2013-07-14 ENCOUNTER — Ambulatory Visit (INDEPENDENT_AMBULATORY_CARE_PROVIDER_SITE_OTHER): Payer: Medicaid Other | Admitting: Family Medicine

## 2013-07-14 ENCOUNTER — Encounter: Payer: Self-pay | Admitting: Family Medicine

## 2013-07-14 VITALS — BP 104/65 | HR 88 | Wt 204.0 lb

## 2013-07-14 DIAGNOSIS — Z3490 Encounter for supervision of normal pregnancy, unspecified, unspecified trimester: Secondary | ICD-10-CM

## 2013-07-14 DIAGNOSIS — Z3480 Encounter for supervision of other normal pregnancy, unspecified trimester: Secondary | ICD-10-CM

## 2013-07-14 NOTE — Patient Instructions (Signed)
Third Trimester of Pregnancy The third trimester is from week 29 through week 42, months 7 through 9. The third trimester is a time when the fetus is growing rapidly. At the end of the ninth month, the fetus is about 20 inches in length and weighs 6 10 pounds.  BODY CHANGES Your body goes through many changes during pregnancy. The changes vary from woman to woman.   Your weight will continue to increase. You can expect to gain 25 35 pounds (11 16 kg) by the end of the pregnancy.  You may begin to get stretch marks on your hips, abdomen, and breasts.  You may urinate more often because the fetus is moving lower into your pelvis and pressing on your bladder.  You may develop or continue to have heartburn as a result of your pregnancy.  You may develop constipation because certain hormones are causing the muscles that push waste through your intestines to slow down.  You may develop hemorrhoids or swollen, bulging veins (varicose veins).  You may have pelvic pain because of the weight gain and pregnancy hormones relaxing your joints between the bones in your pelvis. Back aches may result from over exertion of the muscles supporting your posture.  Your breasts will continue to grow and be tender. A yellow discharge may leak from your breasts called colostrum.  Your belly button may stick out.  You may feel short of breath because of your expanding uterus.  You may notice the fetus "dropping," or moving lower in your abdomen.  You may have a bloody mucus discharge. This usually occurs a few days to a week before labor begins.  Your cervix becomes thin and soft (effaced) near your due date. WHAT TO EXPECT AT YOUR PRENATAL EXAMS  You will have prenatal exams every 2 weeks until week 36. Then, you will have weekly prenatal exams. During a routine prenatal visit:  You will be weighed to make sure you and the fetus are growing normally.  Your blood pressure is taken.  Your abdomen will  be measured to track your baby's growth.  The fetal heartbeat will be listened to.  Any test results from the previous visit will be discussed.  You may have a cervical check near your due date to see if you have effaced. At around 36 weeks, your caregiver will check your cervix. At the same time, your caregiver will also perform a test on the secretions of the vaginal tissue. This test is to determine if a type of bacteria, Group B streptococcus, is present. Your caregiver will explain this further. Your caregiver may ask you:  What your birth plan is.  How you are feeling.  If you are feeling the baby move.  If you have had any abnormal symptoms, such as leaking fluid, bleeding, severe headaches, or abdominal cramping.  If you have any questions. Other tests or screenings that may be performed during your third trimester include:  Blood tests that check for low iron levels (anemia).  Fetal testing to check the health, activity level, and growth of the fetus. Testing is done if you have certain medical conditions or if there are problems during the pregnancy. FALSE LABOR You may feel small, irregular contractions that eventually go away. These are called Braxton Hicks contractions, or false labor. Contractions may last for hours, days, or even weeks before true labor sets in. If contractions come at regular intervals, intensify, or become painful, it is best to be seen by your caregiver.    SIGNS OF LABOR   Menstrual-like cramps.  Contractions that are 5 minutes apart or less.  Contractions that start on the top of the uterus and spread down to the lower abdomen and back.  A sense of increased pelvic pressure or back pain.  A watery or bloody mucus discharge that comes from the vagina. If you have any of these signs before the 37th week of pregnancy, call your caregiver right away. You need to go to the hospital to get checked immediately. HOME CARE INSTRUCTIONS   Avoid all  smoking, herbs, alcohol, and unprescribed drugs. These chemicals affect the formation and growth of the baby.  Follow your caregiver's instructions regarding medicine use. There are medicines that are either safe or unsafe to take during pregnancy.  Exercise only as directed by your caregiver. Experiencing uterine cramps is a good sign to stop exercising.  Continue to eat regular, healthy meals.  Wear a good support bra for breast tenderness.  Do not use hot tubs, steam rooms, or saunas.  Wear your seat belt at all times when driving.  Avoid raw meat, uncooked cheese, cat litter boxes, and soil used by cats. These carry germs that can cause birth defects in the baby.  Take your prenatal vitamins.  Try taking a stool softener (if your caregiver approves) if you develop constipation. Eat more high-fiber foods, such as fresh vegetables or fruit and whole grains. Drink plenty of fluids to keep your urine clear or pale yellow.  Take warm sitz baths to soothe any pain or discomfort caused by hemorrhoids. Use hemorrhoid cream if your caregiver approves.  If you develop varicose veins, wear support hose. Elevate your feet for 15 minutes, 3 4 times a day. Limit salt in your diet.  Avoid heavy lifting, wear low heal shoes, and practice good posture.  Rest a lot with your legs elevated if you have leg cramps or low back pain.  Visit your dentist if you have not gone during your pregnancy. Use a soft toothbrush to brush your teeth and be gentle when you floss.  A sexual relationship may be continued unless your caregiver directs you otherwise.  Do not travel far distances unless it is absolutely necessary and only with the approval of your caregiver.  Take prenatal classes to understand, practice, and ask questions about the labor and delivery.  Make a trial run to the hospital.  Pack your hospital bag.  Prepare the baby's nursery.  Continue to go to all your prenatal visits as directed  by your caregiver. SEEK MEDICAL CARE IF:  You are unsure if you are in labor or if your water has broken.  You have dizziness.  You have mild pelvic cramps, pelvic pressure, or nagging pain in your abdominal area.  You have persistent nausea, vomiting, or diarrhea.  You have a bad smelling vaginal discharge.  You have pain with urination. SEEK IMMEDIATE MEDICAL CARE IF:   You have a fever.  You are leaking fluid from your vagina.  You have spotting or bleeding from your vagina.  You have severe abdominal cramping or pain.  You have rapid weight loss or gain.  You have shortness of breath with chest pain.  You notice sudden or extreme swelling of your face, hands, ankles, feet, or legs.  You have not felt your baby move in over an hour.  You have severe headaches that do not go away with medicine.  You have vision changes. Document Released: 01/14/2001 Document Revised: 09/22/2012 Document Reviewed:   03/23/2012 [employer] Patient Information 2014 [employer].  Breastfeeding Deciding to breastfeed is one of the best choices you can make for you and your baby. A change in hormones during pregnancy causes your breast tissue to grow and increases the number and size of your milk ducts. These hormones also allow proteins, sugars, and fats from your blood supply to make breast milk in your milk-producing glands. Hormones prevent breast milk from being released before your baby is born as well as prompt milk flow after birth. Once breastfeeding has begun, thoughts of your baby, as well as his or her sucking or crying, can stimulate the release of milk from your milk-producing glands.  BENEFITS OF BREASTFEEDING For Your Baby  Your first milk (colostrum) helps your baby's digestive system function better.   There are antibodies in your milk that help your baby fight off infections.   Your baby has a lower incidence of asthma, allergies, and sudden infant death syndrome.    The nutrients in breast milk are better for your baby than infant formulas and are designed uniquely for your baby's needs.   Breast milk improves your baby's brain development.   Your baby is less likely to develop other conditions, such as childhood obesity, asthma, or type 2 diabetes mellitus.  For You   Breastfeeding helps to create a very special bond between you and your baby.   Breastfeeding is convenient. Breast milk is always available at the correct temperature and costs nothing.   Breastfeeding helps to burn calories and helps you lose the weight gained during pregnancy.   Breastfeeding makes your uterus contract to its prepregnancy size faster and slows bleeding (lochia) after you give birth.   Breastfeeding helps to lower your risk of developing type 2 diabetes mellitus, osteoporosis, and breast or ovarian cancer later in life. SIGNS THAT YOUR BABY IS HUNGRY Early Signs of Hunger  Increased alertness or activity.  Stretching.  Movement of the head from side to side.  Movement of the head and opening of the mouth when the corner of the mouth or cheek is stroked (rooting).  Increased sucking sounds, smacking lips, cooing, sighing, or squeaking.  Hand-to-mouth movements.  Increased sucking of fingers or hands. Late Signs of Hunger  Fussing.  Intermittent crying. Extreme Signs of Hunger Signs of extreme hunger will require calming and consoling before your baby will be able to breastfeed successfully. Do not wait for the following signs of extreme hunger to occur before you initiate breastfeeding:   Restlessness.  A loud, strong cry.   Screaming. BREASTFEEDING BASICS Breastfeeding Initiation  Find a comfortable place to sit or lie down, with your neck and back well supported.  Place a pillow or rolled up blanket under your baby to bring him or her to the level of your breast (if you are seated). Nursing pillows are specially designed to help  support your arms and your baby while you breastfeed.  Make sure that your baby's abdomen is facing your abdomen.   Gently massage your breast. With your fingertips, massage from your chest wall toward your nipple in a circular motion. This encourages milk flow. You may need to continue this action during the feeding if your milk flows slowly.  Support your breast with 4 fingers underneath and your thumb above your nipple. Make sure your fingers are well away from your nipple and your baby's mouth.   Stroke your baby's lips gently with your finger or nipple.   When your baby's mouth is   open wide enough, quickly bring your baby to your breast, placing your entire nipple and as much of the colored area around your nipple (areola) as possible into your baby's mouth.   More areola should be visible above your baby's upper lip than below the lower lip.   Your baby's tongue should be between his or her lower gum and your breast.   Ensure that your baby's mouth is correctly positioned around your nipple (latched). Your baby's lips should create a seal on your breast and be turned out (everted).  It is common for your baby to suck about 2 3 minutes in order to start the flow of breast milk. Latching Teaching your baby how to latch on to your breast properly is very important. An improper latch can cause nipple pain and decreased milk supply for you and poor weight gain in your baby. Also, if your baby is not latched onto your nipple properly, he or she may swallow some air during feeding. This can make your baby fussy. Burping your baby when you switch breasts during the feeding can help to get rid of the air. However, teaching your baby to latch on properly is still the best way to prevent fussiness from swallowing air while breastfeeding. Signs that your baby has successfully latched on to your nipple:    Silent tugging or silent sucking, without causing you pain.   Swallowing heard  between every 3 4 sucks.    Muscle movement above and in front of his or her ears while sucking.  Signs that your baby has not successfully latched on to nipple:   Sucking sounds or smacking sounds from your baby while breastfeeding.  Nipple pain. If you think your baby has not latched on correctly, slip your finger into the corner of your baby's mouth to break the suction and place it between your baby's gums. Attempt breastfeeding initiation again. Signs of Successful Breastfeeding Signs from your baby:   A gradual decrease in the number of sucks or complete cessation of sucking.   Falling asleep.   Relaxation of his or her body.   Retention of a small amount of milk in his or her mouth.   Letting go of your breast by himself or herself. Signs from you:  Breasts that have increased in firmness, weight, and size 1 3 hours after feeding.   Breasts that are softer immediately after breastfeeding.  Increased milk volume, as well as a change in milk consistency and color by the 5th day of breastfeeding.   Nipples that are not sore, cracked, or bleeding. Signs That Your Baby is Getting Enough Milk  Wetting at least 3 diapers in a 24-hour period. The urine should be clear and pale yellow by age 5 days.  At least 3 stools in a 24-hour period by age 5 days. The stool should be soft and yellow.  At least 3 stools in a 24-hour period by age 7 days. The stool should be seedy and yellow.  No loss of weight greater than 10% of birth weight during the first 3 days of age.  Average weight gain of 4 7 ounces (120 210 mL) per week after age 4 days.  Consistent daily weight gain by age 5 days, without weight loss after the age of 2 weeks. After a feeding, your baby may spit up a small amount. This is common. BREASTFEEDING FREQUENCY AND DURATION Frequent feeding will help you make more milk and can prevent sore nipples and breast engorgement.   Breastfeed when you feel the need to  reduce the fullness of your breasts or when your baby shows signs of hunger. This is called "breastfeeding on demand." Avoid introducing a pacifier to your baby while you are working to establish breastfeeding (the first 4 6 weeks after your baby is born). After this time you may choose to use a pacifier. Research has shown that pacifier use during the first year of a baby's life decreases the risk of sudden infant death syndrome (SIDS). Allow your baby to feed on each breast as long as he or she wants. Breastfeed until your baby is finished feeding. When your baby unlatches or falls asleep while feeding from the first breast, offer the second breast. Because newborns are often sleepy in the first few weeks of life, you may need to awaken your baby to get him or her to feed. Breastfeeding times will vary from baby to baby. However, the following rules can serve as a guide to help you ensure that your baby is properly fed:  Newborns (babies 4 weeks of age or younger) may breastfeed every 1 3 hours.  Newborns should not go longer than 3 hours during the day or 5 hours during the night without breastfeeding.  You should breastfeed your baby a minimum of 8 times in a 24-hour period until you begin to introduce solid foods to your baby at around 6 months of age. BREAST MILK PUMPING Pumping and storing breast milk allows you to ensure that your baby is exclusively fed your breast milk, even at times when you are unable to breastfeed. This is especially important if you are going back to work while you are still breastfeeding or when you are not able to be present during feedings. Your lactation consultant can give you guidelines on how long it is safe to store breast milk.  A breast pump is a machine that allows you to pump milk from your breast into a sterile bottle. The pumped breast milk can then be stored in a refrigerator or freezer. Some breast pumps are operated by hand, while others use electricity. Ask  your lactation consultant which type will work best for you. Breast pumps can be purchased, but some hospitals and breastfeeding support groups lease breast pumps on a monthly basis. A lactation consultant can teach you how to hand express breast milk, if you prefer not to use a pump.  CARING FOR YOUR BREASTS WHILE YOU BREASTFEED Nipples can become dry, cracked, and sore while breastfeeding. The following recommendations can help keep your breasts moisturized and healthy:  Avoid using soap on your nipples.   Wear a supportive bra. Although not required, special nursing bras and tank tops are designed to allow access to your breasts for breastfeeding without taking off your entire bra or top. Avoid wearing underwire style bras or extremely tight bras.  Air dry your nipples for 3 4minutes after each feeding.   Use only cotton bra pads to absorb leaked breast milk. Leaking of breast milk between feedings is normal.   Use lanolin on your nipples after breastfeeding. Lanolin helps to maintain your skin's normal moisture barrier. If you use pure lanolin you do not need to wash it off before feeding your baby again. Pure lanolin is not toxic to your baby. You may also hand express a few drops of breast milk and gently massage that milk into your nipples and allow the milk to air dry. In the first few weeks after giving birth, some women   experience extremely full breasts (engorgement). Engorgement can make your breasts feel heavy, warm, and tender to the touch. Engorgement peaks within 3 5 days after you give birth. The following recommendations can help ease engorgement:  Completely empty your breasts while breastfeeding or pumping. You may want to start by applying warm, moist heat (in the shower or with warm water-soaked hand towels) just before feeding or pumping. This increases circulation and helps the milk flow. If your baby does not completely empty your breasts while breastfeeding, pump any extra  milk after he or she is finished.  Wear a snug bra (nursing or regular) or tank top for 1 2 days to signal your body to slightly decrease milk production.  Apply ice packs to your breasts, unless this is too uncomfortable for you.  Make sure that your baby is latched on and positioned properly while breastfeeding. If engorgement persists after 48 hours of following these recommendations, contact your health care provider or a lactation consultant. OVERALL HEALTH CARE RECOMMENDATIONS WHILE BREASTFEEDING  Eat healthy foods. Alternate between meals and snacks, eating 3 of each per day. Because what you eat affects your breast milk, some of the foods may make your baby more irritable than usual. Avoid eating these foods if you are sure that they are negatively affecting your baby.  Drink milk, fruit juice, and water to satisfy your thirst (about 10 glasses a day).   Rest often, relax, and continue to take your prenatal vitamins to prevent fatigue, stress, and anemia.  Continue breast self-awareness checks.  Avoid chewing and smoking tobacco.  Avoid alcohol and drug use. Some medicines that may be harmful to your baby can pass through breast milk. It is important to ask your health care provider before taking any medicine, including all over-the-counter and prescription medicine as well as vitamin and herbal supplements. It is possible to become pregnant while breastfeeding. If birth control is desired, ask your health care provider about options that will be safe for your baby. SEEK MEDICAL CARE IF:   You feel like you want to stop breastfeeding or have become frustrated with breastfeeding.  You have painful breasts or nipples.  Your nipples are cracked or bleeding.  Your breasts are red, tender, or warm.  You have a swollen area on either breast.  You have a fever or chills.  You have nausea or vomiting.  You have drainage other than breast milk from your nipples.  Your breasts  do not become full before feedings by the 5th day after you give birth.  You feel sad and depressed.  Your baby is too sleepy to eat well.  Your baby is having trouble sleeping.   Your baby is wetting less than 3 diapers in a 24-hour period.  Your baby has less than 3 stools in a 24-hour period.  Your baby's skin or the white part of his or her eyes becomes yellow.   Your baby is not gaining weight by 5 days of age. SEEK IMMEDIATE MEDICAL CARE IF:   Your baby is overly tired (lethargic) and does not want to wake up and feed.  Your baby develops an unexplained fever. Document Released: 01/20/2005 Document Revised: 09/22/2012 Document Reviewed: 07/14/2012 [employer] Patient Information 2014 [employer].  

## 2013-07-14 NOTE — Progress Notes (Signed)
Seen for PT contractions and received BMZ.  Contractions are coming and going. Reports good fetal movement.

## 2013-07-19 ENCOUNTER — Telehealth: Payer: Self-pay | Admitting: *Deleted

## 2013-07-19 NOTE — Telephone Encounter (Signed)
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  Discussed results with patient she understands that you can be positive for the virus and not have an out break but may still need to take preventative Valtrex at the end of there pregnancy for precautionary reasons.  She ask that this not be discussed with her partner in the exam room.

## 2013-07-24 ENCOUNTER — Encounter: Payer: Self-pay | Admitting: Obstetrics & Gynecology

## 2013-07-27 ENCOUNTER — Encounter: Payer: Self-pay | Admitting: Obstetrics & Gynecology

## 2013-07-27 ENCOUNTER — Ambulatory Visit (INDEPENDENT_AMBULATORY_CARE_PROVIDER_SITE_OTHER): Payer: Medicaid Other | Admitting: Obstetrics & Gynecology

## 2013-07-27 VITALS — BP 110/69 | HR 93 | Wt 204.0 lb

## 2013-07-27 DIAGNOSIS — Z3685 Encounter for antenatal screening for Streptococcus B: Secondary | ICD-10-CM

## 2013-07-27 DIAGNOSIS — Z113 Encounter for screening for infections with a predominantly sexual mode of transmission: Secondary | ICD-10-CM

## 2013-07-27 DIAGNOSIS — Z3490 Encounter for supervision of normal pregnancy, unspecified, unspecified trimester: Secondary | ICD-10-CM

## 2013-07-27 DIAGNOSIS — Z3480 Encounter for supervision of other normal pregnancy, unspecified trimester: Secondary | ICD-10-CM

## 2013-07-27 LAB — OB RESULTS CONSOLE GC/CHLAMYDIA
Chlamydia: NEGATIVE
Gonorrhea: NEGATIVE

## 2013-07-27 LAB — OB RESULTS CONSOLE GBS: GBS: NEGATIVE

## 2013-07-27 NOTE — Progress Notes (Signed)
She continues to have irregular contractions. Denies VB or ROM. Reports good FM. She is worried that she will dilate past the point of being able to get an epidural (as happened with her first baby) so I told her to come in Friday am for a vaginal exam if she likes. Labor precautions reviewed. Cultures obtained. Her cervix is in a posterior position today (required her to lift her pelvis up in order to be able to check her cervix).

## 2013-07-28 ENCOUNTER — Encounter: Payer: Medicaid Other | Admitting: Obstetrics & Gynecology

## 2013-07-28 LAB — GC/CHLAMYDIA PROBE AMP
CT Probe RNA: NEGATIVE
GC PROBE AMP APTIMA: NEGATIVE

## 2013-07-29 ENCOUNTER — Encounter (HOSPITAL_COMMUNITY): Payer: Self-pay

## 2013-07-29 ENCOUNTER — Encounter (HOSPITAL_COMMUNITY): Payer: Self-pay | Admitting: *Deleted

## 2013-07-29 ENCOUNTER — Inpatient Hospital Stay (HOSPITAL_COMMUNITY)
Admission: AD | Admit: 2013-07-29 | Discharge: 2013-07-29 | Disposition: A | Discharge status: Home / Self Care | Payer: Medicaid Other | Source: Ambulatory Visit | Attending: Obstetrics & Gynecology | Admitting: Obstetrics & Gynecology

## 2013-07-29 ENCOUNTER — Encounter (HOSPITAL_COMMUNITY): Payer: Medicaid Other | Admitting: Anesthesiology

## 2013-07-29 ENCOUNTER — Inpatient Hospital Stay (HOSPITAL_COMMUNITY)
Admission: AD | Admit: 2013-07-29 | Discharge: 2013-08-01 | DRG: 774 | Disposition: A | Discharge status: Home / Self Care | Payer: Medicaid Other | Source: Ambulatory Visit | Attending: Obstetrics & Gynecology | Admitting: Obstetrics & Gynecology

## 2013-07-29 ENCOUNTER — Inpatient Hospital Stay (HOSPITAL_COMMUNITY): Payer: Medicaid Other | Admitting: Anesthesiology

## 2013-07-29 ENCOUNTER — Ambulatory Visit (INDEPENDENT_AMBULATORY_CARE_PROVIDER_SITE_OTHER): Payer: Medicaid Other | Admitting: Obstetrics & Gynecology

## 2013-07-29 ENCOUNTER — Encounter: Payer: Self-pay | Admitting: Obstetrics & Gynecology

## 2013-07-29 VITALS — BP 100/66 | HR 97 | Wt 207.8 lb

## 2013-07-29 DIAGNOSIS — A6000 Herpesviral infection of urogenital system, unspecified: Secondary | ICD-10-CM

## 2013-07-29 DIAGNOSIS — Z3480 Encounter for supervision of other normal pregnancy, unspecified trimester: Secondary | ICD-10-CM

## 2013-07-29 DIAGNOSIS — O99334 Smoking (tobacco) complicating childbirth: Secondary | ICD-10-CM | POA: Diagnosis present

## 2013-07-29 DIAGNOSIS — R768 Other specified abnormal immunological findings in serum: Secondary | ICD-10-CM

## 2013-07-29 DIAGNOSIS — F329 Major depressive disorder, single episode, unspecified: Secondary | ICD-10-CM | POA: Diagnosis present

## 2013-07-29 DIAGNOSIS — O99344 Other mental disorders complicating childbirth: Secondary | ICD-10-CM | POA: Diagnosis present

## 2013-07-29 DIAGNOSIS — O98519 Other viral diseases complicating pregnancy, unspecified trimester: Secondary | ICD-10-CM | POA: Insufficient documentation

## 2013-07-29 DIAGNOSIS — Z8279 Family history of other congenital malformations, deformations and chromosomal abnormalities: Secondary | ICD-10-CM

## 2013-07-29 DIAGNOSIS — O4700 False labor before 37 completed weeks of gestation, unspecified trimester: Secondary | ICD-10-CM | POA: Insufficient documentation

## 2013-07-29 DIAGNOSIS — Z3490 Encounter for supervision of normal pregnancy, unspecified, unspecified trimester: Secondary | ICD-10-CM

## 2013-07-29 DIAGNOSIS — F3289 Other specified depressive episodes: Secondary | ICD-10-CM | POA: Diagnosis present

## 2013-07-29 DIAGNOSIS — O479 False labor, unspecified: Secondary | ICD-10-CM | POA: Diagnosis present

## 2013-07-29 HISTORY — DX: Anxiety disorder, unspecified: F41.9

## 2013-07-29 HISTORY — DX: Anogenital herpesviral infection, unspecified: A60.9

## 2013-07-29 LAB — CBC
HCT: 37.1 % (ref 36.0–46.0)
Hemoglobin: 13 g/dL (ref 12.0–15.0)
MCH: 33.4 pg (ref 26.0–34.0)
MCHC: 35 g/dL (ref 30.0–36.0)
MCV: 95.4 fL (ref 78.0–100.0)
PLATELETS: 275 10*3/uL (ref 150–400)
RBC: 3.89 MIL/uL (ref 3.87–5.11)
RDW: 13.8 % (ref 11.5–15.5)
WBC: 11.9 10*3/uL — ABNORMAL HIGH (ref 4.0–10.5)

## 2013-07-29 MED ORDER — LIDOCAINE HCL (PF) 1 % IJ SOLN
INTRAMUSCULAR | Status: DC | PRN
  Administered 2013-07-29: 10 mL

## 2013-07-29 MED ORDER — OXYTOCIN BOLUS FROM INFUSION
500.0000 mL | INTRAVENOUS | Status: DC
  Administered 2013-07-30: 500 mL via INTRAVENOUS

## 2013-07-29 MED ORDER — EPHEDRINE 5 MG/ML INJ
10.0000 mg | INTRAVENOUS | Status: DC | PRN
Start: 2013-07-29 — End: 2013-07-30
  Filled 2013-07-29: qty 2

## 2013-07-29 MED ORDER — ONDANSETRON HCL 4 MG/2ML IJ SOLN: 4.0000 mg | Freq: Four times a day (QID) | INTRAMUSCULAR | Status: DC | PRN

## 2013-07-29 MED ORDER — PHENYLEPHRINE 40 MCG/ML (10ML) SYRINGE FOR IV PUSH (FOR BLOOD PRESSURE SUPPORT)
80.0000 ug | PREFILLED_SYRINGE | INTRAVENOUS | Status: DC | PRN
  Filled 2013-07-29: qty 10
  Filled 2013-07-29: qty 2

## 2013-07-29 MED ORDER — ACETAMINOPHEN 325 MG PO TABS: 650.0000 mg | ORAL_TABLET | ORAL | Status: DC | PRN

## 2013-07-29 MED ORDER — FENTANYL 2.5 MCG/ML BUPIVACAINE 1/10 % EPIDURAL INFUSION (WH - ANES)
14.0000 mL/h | INTRAMUSCULAR | Status: DC | PRN
  Administered 2013-07-29: 14 mL/h via EPIDURAL
  Filled 2013-07-29: qty 125

## 2013-07-29 MED ORDER — DIPHENHYDRAMINE HCL 50 MG/ML IJ SOLN: 12.5000 mg | INTRAMUSCULAR | Status: DC | PRN

## 2013-07-29 MED ORDER — PHENYLEPHRINE 40 MCG/ML (10ML) SYRINGE FOR IV PUSH (FOR BLOOD PRESSURE SUPPORT)
80.0000 ug | PREFILLED_SYRINGE | INTRAVENOUS | Status: DC | PRN
  Filled 2013-07-29: qty 2

## 2013-07-29 MED ORDER — OXYCODONE-ACETAMINOPHEN 5-325 MG PO TABS: 1.0000 | ORAL_TABLET | ORAL | Status: DC | PRN

## 2013-07-29 MED ORDER — LACTATED RINGERS IV SOLN
INTRAVENOUS | Status: DC
  Administered 2013-07-30: 01:00:00 via INTRAVENOUS

## 2013-07-29 MED ORDER — LACTATED RINGERS IV SOLN: 500.0000 mL | INTRAVENOUS | Status: DC | PRN

## 2013-07-29 MED ORDER — LACTATED RINGERS IV SOLN
500.0000 mL | Freq: Once | INTRAVENOUS | Status: AC
Start: 2013-07-29 — End: 2013-07-29
  Administered 2013-07-29: 500 mL via INTRAVENOUS

## 2013-07-29 MED ORDER — EPHEDRINE 5 MG/ML INJ
10.0000 mg | INTRAVENOUS | Status: DC | PRN
  Filled 2013-07-29: qty 2
  Filled 2013-07-29: qty 4

## 2013-07-29 MED ORDER — OXYTOCIN 40 UNITS IN LACTATED RINGERS INFUSION - SIMPLE MED
62.5000 mL/h | INTRAVENOUS | Status: DC
  Filled 2013-07-29: qty 1000

## 2013-07-29 MED ORDER — FENTANYL 2.5 MCG/ML BUPIVACAINE 1/10 % EPIDURAL INFUSION (WH - ANES)
14.0000 mL/h | INTRAMUSCULAR | Status: DC | PRN
  Administered 2013-07-29: 14 mL/h via EPIDURAL

## 2013-07-29 MED ORDER — CITRIC ACID-SODIUM CITRATE 334-500 MG/5ML PO SOLN: 30.0000 mL | ORAL | Status: DC | PRN

## 2013-07-29 MED ORDER — IBUPROFEN 600 MG PO TABS
600.0000 mg | ORAL_TABLET | Freq: Four times a day (QID) | ORAL | Status: DC | PRN
  Administered 2013-07-30: 600 mg via ORAL
  Filled 2013-07-29: qty 1

## 2013-07-29 MED ORDER — LIDOCAINE HCL (PF) 1 % IJ SOLN
30.0000 mL | INTRAMUSCULAR | Status: DC | PRN
  Filled 2013-07-29: qty 30

## 2013-07-29 NOTE — Discharge Instructions (Signed)
Schedule appt for Monday morning.
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
## 2013-07-29 NOTE — MAU Note (Signed)
Sent from office, rtn prenatal was 6+ cm, pt not feeling ctx's. No leaking or bleeding. Did not feel labor with last delivery.

## 2013-07-29 NOTE — Anesthesia Procedure Notes (Signed)
Epidural Patient location during procedure: OB  Preanesthetic Checklist Completed: patient identified, site marked, surgical consent, pre-op evaluation, timeout performed, IV checked, risks and benefits discussed and monitors and equipment checked  Epidural Patient position: sitting Prep: site prepped and draped and DuraPrep Patient monitoring: continuous pulse ox and blood pressure Approach: midline Injection technique: LOR air  Needle:  Needle type: Tuohy  Needle gauge: 17 G Needle length: 9 cm and 9 Needle insertion depth: 6 cm Catheter type: closed end flexible Catheter size: 19 Gauge Catheter at skin depth: 13 cm Test dose: negative  Assessment Events: blood not aspirated, injection not painful, no injection resistance, negative IV test and no paresthesia  Additional Notes Dosing of Epidural:  1st dose, through catheter .............................................  Xylocaine 40 mg  2nd dose, through catheter, after waiting 3 minutes.........Xylocaine 60 mg    ( 1% Xylo charted as a single dose in Epic Meds for ease of charting; actual dosing was fractionated as above, for saftey's sake)  As each dose occurred, patient was free of IV sx; and patient exhibited no evidence of SA injection.  Patient is more comfortable after epidural dosed. Please see RN's note for documentation of vital signs,and FHR which are stable.  Patient reminded not to try to ambulate with numb legs, and that an RN must be present when she attempts to get up.       

## 2013-07-29 NOTE — Progress Notes (Signed)
EFM adjusted 

## 2013-07-29 NOTE — Anesthesia Preprocedure Evaluation (Addendum)

## 2013-07-29 NOTE — MAU Note (Signed)
Contractions all day. Stronger since 1730. No bleeing or leaking fld

## 2013-07-29 NOTE — Progress Notes (Signed)
She is here for a cervical exam. Her contractions have not worsened. Denies VB or ROM. She reports good FM. I have recommended that she go to the hospital today.
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
## 2013-07-30 ENCOUNTER — Encounter (HOSPITAL_COMMUNITY): Payer: Self-pay | Admitting: Obstetrics

## 2013-07-30 LAB — RPR

## 2013-07-30 LAB — CULTURE, BETA STREP (GROUP B ONLY)

## 2013-07-30 MED ORDER — SENNOSIDES-DOCUSATE SODIUM 8.6-50 MG PO TABS
2.0000 | ORAL_TABLET | ORAL | Status: DC
  Administered 2013-07-30 – 2013-07-31 (×2): 2 via ORAL
  Filled 2013-07-30 (×2): qty 2

## 2013-07-30 MED ORDER — ONDANSETRON HCL 4 MG PO TABS: 4.0000 mg | ORAL_TABLET | ORAL | Status: DC | PRN

## 2013-07-30 MED ORDER — ZOLPIDEM TARTRATE 5 MG PO TABS: 5.0000 mg | ORAL_TABLET | Freq: Every evening | ORAL | Status: DC | PRN

## 2013-07-30 MED ORDER — DIPHENHYDRAMINE HCL 25 MG PO CAPS: 25.0000 mg | ORAL_CAPSULE | Freq: Four times a day (QID) | ORAL | Status: DC | PRN

## 2013-07-30 MED ORDER — DIBUCAINE 1 % RE OINT: 1.0000 "application " | TOPICAL_OINTMENT | RECTAL | Status: DC | PRN

## 2013-07-30 MED ORDER — LANOLIN HYDROUS EX OINT: TOPICAL_OINTMENT | CUTANEOUS | Status: DC | PRN

## 2013-07-30 MED ORDER — BENZOCAINE-MENTHOL 20-0.5 % EX AERO: 1.0000 "application " | INHALATION_SPRAY | CUTANEOUS | Status: DC | PRN

## 2013-07-30 MED ORDER — IBUPROFEN 600 MG PO TABS
600.0000 mg | ORAL_TABLET | Freq: Four times a day (QID) | ORAL | Status: DC
  Administered 2013-07-30 – 2013-08-01 (×9): 600 mg via ORAL
  Filled 2013-07-30 (×9): qty 1

## 2013-07-30 MED ORDER — PRENATAL MULTIVITAMIN CH
1.0000 | ORAL_TABLET | Freq: Every day | ORAL | Status: DC
  Administered 2013-07-30 – 2013-08-01 (×3): 1 via ORAL
  Filled 2013-07-30 (×3): qty 1

## 2013-07-30 MED ORDER — SIMETHICONE 80 MG PO CHEW: 80.0000 mg | CHEWABLE_TABLET | ORAL | Status: DC | PRN

## 2013-07-30 MED ORDER — TETANUS-DIPHTH-ACELL PERTUSSIS 5-2.5-18.5 LF-MCG/0.5 IM SUSP: 0.5000 mL | Freq: Once | INTRAMUSCULAR | Status: DC

## 2013-07-30 MED ORDER — OXYCODONE-ACETAMINOPHEN 5-325 MG PO TABS
1.0000 | ORAL_TABLET | ORAL | Status: DC | PRN
  Administered 2013-07-30 – 2013-07-31 (×4): 2 via ORAL
  Administered 2013-07-31: 1 via ORAL
  Filled 2013-07-30 (×2): qty 2
  Filled 2013-07-30: qty 1
  Filled 2013-07-30 (×3): qty 2

## 2013-07-30 MED ORDER — ONDANSETRON HCL 4 MG/2ML IJ SOLN
4.0000 mg | INTRAMUSCULAR | Status: DC | PRN
Start: 2013-07-30 — End: 2013-08-01

## 2013-07-30 MED ORDER — WITCH HAZEL-GLYCERIN EX PADS: 1.0000 "application " | MEDICATED_PAD | CUTANEOUS | Status: DC | PRN
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
## 2013-07-30 NOTE — Anesthesia Postprocedure Evaluation (Signed)
Anesthesia Post Note  Patient: Alicia Pearson  Procedure(s) Performed: * No procedures listed *  Anesthesia type: Epidural  Patient location: Mother/Baby  Post pain: Pain level controlled  Post assessment: Post-op Vital signs reviewed  Last Vitals:  Filed Vitals:   07/30/13 0830  BP: 106/69  Pulse: 80  Temp: 36.3 C  Resp: 16    Post vital signs: Reviewed  Level of consciousness: awake  Complications: No apparent anesthesia complications

## 2013-07-31 NOTE — Progress Notes (Addendum)
Post Partum Day 1 Subjective: no complaints, up ad lib, voiding and tolerating PO Pt is breastfeeding without difficulty.  Objective: Blood pressure 101/62, pulse 64, temperature 98.1 F (36.7 C), temperature source Oral, resp. rate 16, height 5\' 3"  (1.6 m), weight 203 lb (92.08 kg), last menstrual period 11/16/2012, SpO2 100.00%, unknown if currently breastfeeding.  Physical Exam:  General: alert and no distress Lochia: appropriate Uterine Fundus: firm Incision: n/a DVT Evaluation: No evidence of DVT seen on physical exam.   Recent Labs  07/29/13 2225  HGB 13.0  HCT 37.1    Assessment/Plan: Plan for discharge tomorrow Nexplanon in ofc Lactation consult   LOS: 2 days   HARRAWAY-SMITH, CAROLYN 07/31/2013, 12:51 PM

## 2013-08-01 MED ORDER — IBUPROFEN 600 MG PO TABS: 600.0000 mg | ORAL_TABLET | Freq: Four times a day (QID) | ORAL | Status: DC

## 2013-08-01 NOTE — Progress Notes (Signed)
Ur chart review completed per request.  

## 2013-08-01 NOTE — Discharge Instructions (Signed)
Vaginal Delivery °Care After °Refer to this sheet in the next few weeks. These discharge instructions provide you with information on caring for yourself after delivery. Your caregiver may also give you specific instructions. Your treatment has been planned according to the most current medical practices available, but problems sometimes occur. Call your caregiver if you have any problems or questions after you go home. °HOME CARE INSTRUCTIONS °· Take over-the-counter or prescription medicines only as directed by your caregiver or pharmacist. °· Do not drink alcohol, especially if you are breastfeeding or taking medicine to relieve pain. °· Do not chew or smoke tobacco. °· Do not use illegal drugs. °· Continue to use good perineal care. Good perineal care includes: °¨ Wiping your perineum from front to back. °¨ Keeping your perineum clean. °· Do not use tampons or douche until your caregiver says it is okay. °· Shower, wash your hair, and take tub baths as directed by your caregiver. °· Wear a well-fitting bra that provides breast support. °· Eat healthy foods. °· Drink enough fluids to keep your urine clear or pale yellow. °· Eat high-fiber foods such as whole grain cereals and breads, brown rice, beans, and fresh fruits and vegetables every day. These foods may help prevent or relieve constipation. °· Follow your cargiver's recommendations regarding resumption of activities such as climbing stairs, driving, lifting, exercising, or traveling. °· Talk to your caregiver about resuming sexual activities. Resumption of sexual activities is dependent upon your risk of infection, your rate of healing, and your comfort and desire to resume sexual activity. °· Try to have someone help you with your household activities and your newborn for at least a few days after you leave the hospital. °· Rest as much as possible. Try to rest or take a nap when your newborn is sleeping. °· Increase your activities gradually. °· Keep all  of your scheduled postpartum appointments. It is very important to keep your scheduled follow-up appointments. At these appointments, your caregiver will be checking to make sure that you are healing physically and emotionally. °SEEK MEDICAL CARE IF:  °· You are passing large clots from your vagina. Save any clots to show your caregiver. °· You have a foul smelling discharge from your vagina. °· You have trouble urinating. °· You are urinating frequently. °· You have pain when you urinate. °· You have a change in your bowel movements. °· You have increasing redness, pain, or swelling near your vaginal incision (episiotomy) or vaginal tear. °· You have pus draining from your episiotomy or vaginal tear. °· Your episiotomy or vaginal tear is separating. °· You have painful, hard, or reddened breasts. °· You have a severe headache. °· You have blurred vision or see spots. °· You feel sad or depressed. °· You have thoughts of hurting yourself or your newborn. °· You have questions about your care, the care of your newborn, or medicines. °· You are dizzy or lightheaded. °· You have a rash. °· You have nausea or vomiting. °· You were breastfeeding and have not had a menstrual period within 12 weeks after you stopped breastfeeding. °· You are not breastfeeding and have not had a menstrual period by the 12th week after delivery. °· You have a fever. °SEEK IMMEDIATE MEDICAL CARE IF:  °· You have persistent pain. °· You have chest pain. °· You have shortness of breath. °· You faint. °· You have leg pain. °· You have stomach pain. °· Your vaginal bleeding saturates two or more sanitary pads   in 1 hour. MAKE SURE YOU:   Understand these instructions.  Will watch your condition.  Will get help right away if you are not doing well or get worse. Document Released: 01/18/2000 Document Revised: 10/15/2011 Document Reviewed: 09/17/2011 [facility] Patient Information 2015 [employer] [place]. This information is not intended to  replace advice given to you by your health care provider. Make sure you discuss any questions you have with your health care provider.     Levonorgestrel intrauterine device (IUD) What is this medicine? LEVONORGESTREL IUD (LEE voe nor jes trel) is a contraceptive (birth control) device. The device is placed inside the uterus by a healthcare professional. It is used to prevent pregnancy and can also be used to treat heavy bleeding that occurs during your period. Depending on the device, it can be used for 3 to 5 years. This medicine may be used for other purposes; ask your health care provider or pharmacist if you have questions. COMMON BRAND NAME(S): Gretta CoolMirena, Skyla What should I tell my health care provider before I take this medicine? They need to know if you have any of these conditions: -abnormal Pap smear -cancer of the breast, uterus, or cervix -diabetes -endometritis -genital or pelvic infection now or in the past -have more than one sexual partner or your partner has more than one partner -heart disease -history of an ectopic or tubal pregnancy -immune system problems -IUD in place -liver disease or tumor -problems with blood clots or take blood-thinners -use intravenous drugs -uterus of unusual shape -vaginal bleeding that has not been explained -an unusual or allergic reaction to levonorgestrel, other hormones, silicone, or polyethylene, medicines, foods, dyes, or preservatives -pregnant or trying to get pregnant -breast-feeding How should I use this medicine? This device is placed inside the uterus by a health care professional. Talk to your pediatrician regarding the use of this medicine in children. Special care may be needed. Overdosage: If you think you have taken too much of this medicine contact a poison control center or emergency room at once. NOTE: This medicine is only for you. Do not share this medicine with others. What if I miss a dose? This does not  apply. What may interact with this medicine? Do not take this medicine with any of the following medications: -amprenavir -bosentan -fosamprenavir This medicine may also interact with the following medications: -aprepitant -barbiturate medicines for inducing sleep or treating seizures -bexarotene -griseofulvin -medicines to treat seizures like carbamazepine, ethotoin, felbamate, oxcarbazepine, phenytoin, topiramate -modafinil -pioglitazone -rifabutin -rifampin -rifapentine -some medicines to treat HIV infection like atazanavir, indinavir, lopinavir, nelfinavir, tipranavir, ritonavir -St. John's wort -warfarin This list may not describe all possible interactions. Give your health care provider a list of all the medicines, herbs, non-prescription drugs, or dietary supplements you use. Also tell them if you smoke, drink alcohol, or use illegal drugs. Some items may interact with your medicine. What should I watch for while using this medicine? Visit your doctor or health care professional for regular check ups. See your doctor if you or your partner has sexual contact with others, becomes HIV positive, or gets a sexual transmitted disease. This product does not protect you against HIV infection (AIDS) or other sexually transmitted diseases. You can check the placement of the IUD yourself by reaching up to the top of your vagina with clean fingers to feel the threads. Do not pull on the threads. It is a good habit to check placement after each menstrual period. Call your doctor right away if you  feel more of the IUD than just the threads or if you cannot feel the threads at all. The IUD may come out by itself. You may become pregnant if the device comes out. If you notice that the IUD has come out use a backup birth control method like condoms and call your health care provider. Using tampons will not change the position of the IUD and are okay to use during your period. What side effects may  I notice from receiving this medicine? Side effects that you should report to your doctor or health care professional as soon as possible: -allergic reactions like skin rash, itching or hives, swelling of the face, lips, or tongue -fever, flu-like symptoms -genital sores -high blood pressure -no menstrual period for 6 weeks during use -pain, swelling, warmth in the leg -pelvic pain or tenderness -severe or sudden headache -signs of pregnancy -stomach cramping -sudden shortness of breath -trouble with balance, talking, or walking -unusual vaginal bleeding, discharge -yellowing of the eyes or skin Side effects that usually do not require medical attention (report to your doctor or health care professional if they continue or are bothersome): -acne -breast pain -change in sex drive or performance -changes in weight -cramping, dizziness, or faintness while the device is being inserted -headache -irregular menstrual bleeding within first 3 to 6 months of use -nausea This list may not describe all possible side effects. Call your doctor for medical advice about side effects. You may report side effects to FDA at 1-800-FDA-1088. Where should I keep my medicine? This does not apply. NOTE: This sheet is a summary. It may not cover all possible information. If you have questions about this medicine, talk to your doctor, pharmacist, or health care provider.  2015, [employer]. (2011-02-20 13:54:04)

## 2013-08-01 NOTE — Discharge Summary (Signed)
Obstetric Discharge Summary Reason for Admission: onset of labor and preterm Prenatal Procedures: ultrasound Intrapartum Procedures: spontaneous vaginal delivery Postpartum Procedures: none Complications-Operative and Postpartum: none Hemoglobin  Date Value Ref Range Status  07/29/2013 13.0  12.0 - 15.0 g/dL Final     HCT  Date Value Ref Range Status  07/29/2013 37.1  36.0 - 46.0 % Final   Hospital Course: Patient was admitted 07/29/13 for preterm labor. Epidural placed. Labor progressed normally to SVD on 07/30/13 see delivery note below. Patient meeting postpartum milestones: tolerating diet/ambulation/voiding, lochia decreasing, pain controlled, breastfeeding going well, all questions answered and no additional complaints. Was initially considering BTL but now unsure. Discussed LARC methods, patient is leaning toward IUD or Nexplanon, encouraged to discuss with her primary OB and will follow up at postpartum visit.  Delivery Note  Called to patient's room for delivery. AROM, clear fluid. Mother pushed over intact perineum. At 4:46 AM a viable female was delivered via Vaginal, Spontaneous Delivery (Presentation: Left Occiput Anterior). APGAR: 9, 9; weight pending. Infant delivered to maternal abdomen. Delayed cord clamping. Clamped x 2 and cut. Active management of third stage with traction and pitocin. Placenta status: Intact, Spontaneous. Cord: 3 vessels with the following complications: None. Cord pH: n/a  Anesthesia: Epidural  Episiotomy: None  Lacerations: None  Suture Repair: n/a  Est. Blood Loss (mL): 400  Mom to postpartum. Baby to Couplet care / Skin to Skin.  CNM present for entire delivery.  Alicia Pearson, Alicia Pearson  07/30/2013, 5:18 AM  Physical Exam:  General: alert, cooperative and no distress Lochia: appropriate Uterine Fundus: firm Incision: n/a DVT Evaluation: No evidence of DVT seen on physical exam. Negative Homan's sign.  Discharge Diagnoses: Premature labor and  preterm delivery, vaginal delivery   Discharge Information: Date: 08/01/2013 Activity: pelvic rest Diet: routine Medications: PNV and Ibuprofen Condition: stable Instructions: refer to practice specific booklet and discharge instructions Discharge to: home Follow-up Information   Schedule an appointment as soon as possible for a visit with [facility]. (4 - 6 week postpartum visit, discuss birth control as well. May call sooner to arrange/discuss birth control versus tubal ligation sterilization)    Specialty:  Obstetrics and Gynecology   Contact information:   [address] [place] [id number] [phone]      Newborn Data: Live born female  Birth Weight: 6 lb 7 oz (2920 g) APGAR: 9, 9  Home with mother.  Alicia Pearson, Alicia Pearson 08/01/2013, 8:05 AM  Has been seen by SW two days ago, they stated no barriers to discharge Seen also by me Agree with note Aviva SignsMarie L Williams, CNM

## 2013-08-02 LAB — TYPE AND SCREEN
ABO/RH(D): B POS
Antibody Screen: POSITIVE
DAT, IgG: NEGATIVE
DONOR AG TYPE: NEGATIVE
DONOR AG TYPE: NEGATIVE
UNIT DIVISION: 0
Unit division: 0

## 2013-08-03 ENCOUNTER — Encounter: Payer: Self-pay | Admitting: Obstetrics & Gynecology

## 2013-08-03 NOTE — Discharge Summary (Signed)
Attestation of Attending Supervision of Advanced Practitioner (CNM/NP): Evaluation and management procedures were performed by the Advanced Practitioner under my supervision and collaboration.  I have reviewed the Advanced Practitioner's note and chart, and I agree with the management and plan.  HARRAWAY-SMITH, CAROLYN 8:54 AM     

## 2013-08-10 ENCOUNTER — Ambulatory Visit (HOSPITAL_COMMUNITY): Payer: Medicaid Other

## 2013-08-12 ENCOUNTER — Ambulatory Visit (HOSPITAL_COMMUNITY): Admission: RE | Admit: 2013-08-12 | Payer: Medicaid Other | Source: Ambulatory Visit

## 2013-09-14 ENCOUNTER — Ambulatory Visit: Payer: Medicaid Other | Admitting: Obstetrics & Gynecology

## 2013-11-01 ENCOUNTER — Encounter: Payer: Self-pay | Admitting: Family Medicine

## 2013-11-01 ENCOUNTER — Ambulatory Visit (INDEPENDENT_AMBULATORY_CARE_PROVIDER_SITE_OTHER): Payer: Medicaid Other | Admitting: Family Medicine

## 2013-11-01 VITALS — BP 110/68 | HR 72 | Temp 98.1°F | Wt 185.0 lb

## 2013-11-01 DIAGNOSIS — O9A319 Physical abuse complicating pregnancy, unspecified trimester: Secondary | ICD-10-CM

## 2013-11-01 DIAGNOSIS — O99891 Other specified diseases and conditions complicating pregnancy: Secondary | ICD-10-CM

## 2013-11-01 DIAGNOSIS — O9989 Other specified diseases and conditions complicating pregnancy, childbirth and the puerperium: Secondary | ICD-10-CM

## 2013-11-01 DIAGNOSIS — F17200 Nicotine dependence, unspecified, uncomplicated: Secondary | ICD-10-CM

## 2013-11-01 DIAGNOSIS — Z3009 Encounter for other general counseling and advice on contraception: Secondary | ICD-10-CM | POA: Insufficient documentation

## 2013-11-01 DIAGNOSIS — Z720 Tobacco use: Secondary | ICD-10-CM

## 2013-11-01 DIAGNOSIS — IMO0002 Reserved for concepts with insufficient information to code with codable children: Secondary | ICD-10-CM

## 2013-11-01 DIAGNOSIS — Z9149 Other personal history of psychological trauma, not elsewhere classified: Secondary | ICD-10-CM

## 2013-11-01 DIAGNOSIS — T7411XA Adult physical abuse, confirmed, initial encounter: Secondary | ICD-10-CM

## 2013-11-01 MED ORDER — NICOTINE 7 MG/24HR TD PT24: 7.0000 mg | MEDICATED_PATCH | Freq: Every day | TRANSDERMAL | Status: DC

## 2013-11-01 MED ORDER — PRENATAL MULTIVITAMIN CH: 1.0000 | ORAL_TABLET | Freq: Every day | ORAL | Status: DC

## 2013-11-01 NOTE — Assessment & Plan Note (Signed)
Wants to quit, has been successful in past - Rx given for 7mg  Nicotine patch - She was advised to make apt with Dr Raymondo BandKoval

## 2013-11-01 NOTE — Assessment & Plan Note (Signed)
She feel safe in her home, but says she is required to get counseling - Referred to psychiatry - She already had apt made but needed a referral for medicaid to cover.

## 2013-11-01 NOTE — Assessment & Plan Note (Signed)
She is interested in Depo shots but doesn't want to gain weight; Considering IUD - She will follow-up with her Gyn Dr who she is seeing this friday
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
## 2013-11-01 NOTE — Patient Instructions (Signed)
It was great seeing you today.   1. Please make apt with Dr Raymondo BandKoval to discuss smoking cessation   Please bring all your medications to every doctors visit  Sign up for My Chart to have easy access to your labs results, and communication with your Primary care physician.  Next Appointment  Please make an appointment with Dr Gayla DossJoyner in 1 year or sooner if needed   I look forward to talking with you again at our next visit. If you have any questions or concerns before then, please call the clinic at [phone].  Take Care,   Dr Wenda LowJames Nathalia Wismer

## 2013-11-03 ENCOUNTER — Telehealth: Payer: Self-pay | Admitting: Family Medicine

## 2013-11-03 ENCOUNTER — Telehealth: Payer: Self-pay | Admitting: *Deleted

## 2013-11-03 ENCOUNTER — Ambulatory Visit: Payer: Medicaid Other | Admitting: Obstetrics & Gynecology

## 2013-11-03 NOTE — Telephone Encounter (Signed)
Pt brought in form to be completed by Dr Gayla DossJoyner regarding her outpatient counseling

## 2013-11-03 NOTE — Telephone Encounter (Signed)
Pt is returning call to Dr. Gayla DossJoyner. Alicia RoyaltyErin Pearson

## 2013-11-03 NOTE — Telephone Encounter (Signed)
See referral notes. Jazmin Hartsell,CMA

## 2013-11-04 NOTE — Telephone Encounter (Signed)
Placed in MD box. Fleeger, Jessica Dawn  

## 2013-11-07 NOTE — Telephone Encounter (Signed)
Pt informed that form is complete and ready for pick up.  Nitisha Civello L, RN  

## 2013-11-09 ENCOUNTER — Ambulatory Visit (INDEPENDENT_AMBULATORY_CARE_PROVIDER_SITE_OTHER): Payer: Medicaid Other | Admitting: Obstetrics & Gynecology

## 2013-11-09 ENCOUNTER — Encounter: Payer: Self-pay | Admitting: Obstetrics & Gynecology

## 2013-11-09 DIAGNOSIS — F329 Major depressive disorder, single episode, unspecified: Secondary | ICD-10-CM

## 2013-11-09 DIAGNOSIS — Z01812 Encounter for preprocedural laboratory examination: Secondary | ICD-10-CM

## 2013-11-09 DIAGNOSIS — F32A Depression, unspecified: Secondary | ICD-10-CM | POA: Insufficient documentation

## 2013-11-09 DIAGNOSIS — F53 Postpartum depression: Secondary | ICD-10-CM

## 2013-11-09 DIAGNOSIS — Z30013 Encounter for initial prescription of injectable contraceptive: Secondary | ICD-10-CM

## 2013-11-09 DIAGNOSIS — O99345 Other mental disorders complicating the puerperium: Secondary | ICD-10-CM

## 2013-11-09 LAB — POCT URINE PREGNANCY: Preg Test, Ur: NEGATIVE

## 2013-11-09 MED ORDER — MEDROXYPROGESTERONE ACETATE 150 MG/ML IM SUSP
150.0000 mg | Freq: Once | INTRAMUSCULAR | Status: AC
  Administered 2013-11-09: 150 mg via INTRAMUSCULAR

## 2013-11-09 NOTE — Patient Instructions (Signed)
Postpartum Depression and Baby Blues The postpartum period begins right after the birth of a baby. During this time, there is often a great amount of joy and excitement. It is also a time of many changes in the life of the parents. Regardless of how many times a mother gives birth, each child brings new challenges and dynamics to the family. It is not unusual to have feelings of excitement along with confusing shifts in moods, emotions, and thoughts. All mothers are at risk of developing postpartum depression or the "baby blues." These mood changes can occur right after giving birth, or they may occur many months after giving birth. The baby blues or postpartum depression can be mild or severe. Additionally, postpartum depression can go away rather quickly, or it can be a long-term condition.  CAUSES Raised hormone levels and the rapid drop in those levels are thought to be a main cause of postpartum depression and the baby blues. A number of hormones change during and after pregnancy. Estrogen and progesterone usually decrease right after the delivery of your baby. The levels of thyroid hormone and various cortisol steroids also rapidly drop. Other factors that play a role in these mood changes include major life events and genetics.  RISK FACTORS If you have any of the following risks for the baby blues or postpartum depression, know what symptoms to watch out for during the postpartum period. Risk factors that may increase the likelihood of getting the baby blues or postpartum depression include:  Having a personal or family history of depression.   Having depression while being pregnant.   Having premenstrual mood issues or mood issues related to oral contraceptives.  Having a lot of life stress.   Having marital conflict.   Lacking a social support network.   Having a baby with special needs.   Having health problems, such as diabetes.  SIGNS AND SYMPTOMS Symptoms of baby blues  include:  Brief changes in mood, such as going from extreme happiness to sadness.  Decreased concentration.   Difficulty sleeping.   Crying spells, tearfulness.   Irritability.   Anxiety.  Symptoms of postpartum depression typically begin within the first month after giving birth. These symptoms include:  Difficulty sleeping or excessive sleepiness.   Marked weight loss.   Agitation.   Feelings of worthlessness.   Lack of interest in activity or food.  Postpartum psychosis is a very serious condition and can be dangerous. Fortunately, it is rare. Displaying any of the following symptoms is cause for immediate medical attention. Symptoms of postpartum psychosis include:   Hallucinations and delusions.   Bizarre or disorganized behavior.   Confusion or disorientation.  DIAGNOSIS  A diagnosis is made by an evaluation of your symptoms. There are no medical or lab tests that lead to a diagnosis, but there are various questionnaires that a health care provider may use to identify those with the baby blues, postpartum depression, or psychosis. Often, a screening tool called the Edinburgh Postnatal Depression Scale is used to diagnose depression in the postpartum period.  TREATMENT The baby blues usually goes away on its own in 1-2 weeks. Social support is often all that is needed. You will be encouraged to get adequate sleep and rest. Occasionally, you may be given medicines to help you sleep.  Postpartum depression requires treatment because it can last several months or longer if it is not treated. Treatment may include individual or group therapy, medicine, or both to address any social, physiological, and psychological   factors that may play a role in the depression. Regular exercise, a healthy diet, rest, and social support may also be strongly recommended.  Postpartum psychosis is more serious and needs treatment right away. Hospitalization is often needed. HOME CARE  INSTRUCTIONS  Get as much rest as you can. Nap when the baby sleeps.   Exercise regularly. Some women find yoga and walking to be beneficial.   Eat a balanced and nourishing diet.   Do little things that you enjoy. Have a cup of tea, take a bubble bath, read your favorite magazine, or listen to your favorite music.  Avoid alcohol.   Ask for help with household chores, cooking, grocery shopping, or running errands as needed. Do not try to do everything.   Talk to people close to you about how you are feeling. Get support from your partner, family members, friends, or other new moms.  Try to stay positive in how you think. Think about the things you are grateful for.   Do not spend a lot of time alone.   Only take over-the-counter or prescription medicine as directed by your health care provider.  Keep all your postpartum appointments.   Let your health care provider know if you have any concerns.  SEEK MEDICAL CARE IF: You are having a reaction to or problems with your medicine. SEEK IMMEDIATE MEDICAL CARE IF:  You have suicidal feelings.   You think you may harm the baby or someone else. MAKE SURE YOU:  Understand these instructions.  Will watch your condition.  Will get help right away if you are not doing well or get worse. Document Released: 10/25/2003 Document Revised: 01/25/2013 Document Reviewed: 11/01/2012 ExitCare Patient Information 2015 [employer]. This information is not intended to replace advice given to you by your health care provider. Make sure you discuss any questions you have with your health care provider.  
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
## 2013-11-17 ENCOUNTER — Encounter: Payer: Self-pay | Admitting: Pharmacist

## 2013-11-17 ENCOUNTER — Ambulatory Visit (INDEPENDENT_AMBULATORY_CARE_PROVIDER_SITE_OTHER): Payer: Medicaid Other | Admitting: Pharmacist

## 2013-11-17 VITALS — BP 110/69 | HR 76 | Ht 64.0 in | Wt 188.0 lb

## 2013-11-17 DIAGNOSIS — Z720 Tobacco use: Secondary | ICD-10-CM

## 2013-11-17 MED ORDER — NICOTINE 14 MG/24HR TD PT24: 14.0000 mg | MEDICATED_PATCH | Freq: Every day | TRANSDERMAL | Status: DC

## 2013-11-17 NOTE — Progress Notes (Signed)
S:  Patient arrives in good spirits.   Patient arrives for evaluation/assistance with tobacco dependence.  She attempted quitting using 7mg  patches which she reports were not effective.  Age when started using tobacco on a daily basis [age]. Number of Cigarettes per day 7. Brand smoked [place]. Estimated Nicotine Content per Cigarette (mg) 1.2.  Estimated Nicotine intake per day 8-10mg .   Smokes first cigarette 1 hour after waking. Denies waking to smoke Estimated Fagerstrom Score <4/10.  Most recent quit attempt 1 month Longest time ever been tobacco free 1 year. What Medications (NRT, bupropion, varenicline) used in past includes nicotine patch.  Rates IMPORTANCE of quitting tobacco on 1-10 scale of 10. Rates CONFIDENCE of quitting tobacco on 1-10 scale of 6. Triggers to use tobacco include; stress, eating, talking on the phone.     A/P: mild Nicotine Dependence of 10 years duration in a patient who is good candidate for success b/c of current level of motivation and h/x of quitting for extended periods of time. Initiated nicotine replacement tx 14mg  nicotine patch. Patient counseled on purpose, proper use, and potential adverse effects, including insomnia/vivid dreams, irritation.*Consider dose reduction at next visit w/ PCP.  Discussed weight gain prevention strategies and other possible hurdles for quitting.   F/U PCP visit ~1 month.   Total time in face-to-face counseling 30 minutes.  Patient seen with Cordella RegisterJosh Stewart, PharmD Candidate and Renold Donarly Sabat,  PharmD Resident.

## 2013-11-17 NOTE — Progress Notes (Signed)
Patient ID: Alicia BlizzardDominique A Goldinger, female   DOB: 08/03/1985, 28 y.o.   MRN: [id number] Reviewed: Agree with Dr. Macky LowerKoval's documentation and management.

## 2013-11-17 NOTE — Assessment & Plan Note (Signed)
mild Nicotine Dependence of 10 years duration in a patient who is good candidate for success b/c of current level of motivation and h/x of quitting for extended periods of time. Initiated nicotine replacement tx 14mg  nicotine patch. Patient counseled on purpose, proper use, and potential adverse effects, including insomnia/vivid dreams, irritation.*Consider dose reduction at next visit w/ PCP.  Discussed weight gain prevention strategies and other possible hurdles for quitting.

## 2013-11-17 NOTE — Patient Instructions (Signed)
Thanks for coming to see Alicia Pearson today! Please follow up in about a month with Dr. Gayla DossJoyner, and we will plan on speaking with you shortly after that to see how you're doing. Good luck with quitting.

## 2013-12-05 ENCOUNTER — Encounter: Payer: Self-pay | Admitting: Pharmacist

## 2013-12-22 ENCOUNTER — Ambulatory Visit: Payer: Medicaid Other | Admitting: Family Medicine

## 2013-12-22 ENCOUNTER — Encounter (HOSPITAL_COMMUNITY): Payer: Self-pay | Admitting: Emergency Medicine

## 2013-12-22 ENCOUNTER — Emergency Department (HOSPITAL_COMMUNITY)
Admission: EM | Admit: 2013-12-22 | Discharge: 2013-12-22 | Disposition: A | Discharge status: Home / Self Care | Payer: Medicaid Other | Attending: Emergency Medicine | Admitting: Emergency Medicine

## 2013-12-22 DIAGNOSIS — Y998 Other external cause status: Secondary | ICD-10-CM | POA: Insufficient documentation

## 2013-12-22 DIAGNOSIS — Z87440 Personal history of urinary (tract) infections: Secondary | ICD-10-CM | POA: Insufficient documentation

## 2013-12-22 DIAGNOSIS — X58XXXA Exposure to other specified factors, initial encounter: Secondary | ICD-10-CM | POA: Insufficient documentation

## 2013-12-22 DIAGNOSIS — Z8659 Personal history of other mental and behavioral disorders: Secondary | ICD-10-CM | POA: Diagnosis not present

## 2013-12-22 DIAGNOSIS — T7840XA Allergy, unspecified, initial encounter: Secondary | ICD-10-CM | POA: Diagnosis not present

## 2013-12-22 DIAGNOSIS — Y9289 Other specified places as the place of occurrence of the external cause: Secondary | ICD-10-CM | POA: Diagnosis not present

## 2013-12-22 DIAGNOSIS — Z79899 Other long term (current) drug therapy: Secondary | ICD-10-CM | POA: Insufficient documentation

## 2013-12-22 DIAGNOSIS — Z8619 Personal history of other infectious and parasitic diseases: Secondary | ICD-10-CM | POA: Diagnosis not present

## 2013-12-22 DIAGNOSIS — R21 Rash and other nonspecific skin eruption: Secondary | ICD-10-CM | POA: Diagnosis present

## 2013-12-22 DIAGNOSIS — Z872 Personal history of diseases of the skin and subcutaneous tissue: Secondary | ICD-10-CM | POA: Insufficient documentation

## 2013-12-22 DIAGNOSIS — Z720 Tobacco use: Secondary | ICD-10-CM | POA: Insufficient documentation

## 2013-12-22 DIAGNOSIS — Y9389 Activity, other specified: Secondary | ICD-10-CM | POA: Insufficient documentation

## 2013-12-22 MED ORDER — DIPHENHYDRAMINE HCL 25 MG PO CAPS
50.0000 mg | ORAL_CAPSULE | Freq: Once | ORAL | Status: AC
  Administered 2013-12-22: 50 mg via ORAL
  Filled 2013-12-22: qty 2

## 2013-12-22 MED ORDER — PREDNISONE 20 MG PO TABS
60.0000 mg | ORAL_TABLET | Freq: Once | ORAL | Status: AC
  Administered 2013-12-22: 60 mg via ORAL
  Filled 2013-12-22: qty 3

## 2013-12-22 MED ORDER — DIPHENHYDRAMINE HCL 25 MG PO TABS: 25.0000 mg | ORAL_TABLET | Freq: Four times a day (QID) | ORAL | Status: DC | PRN

## 2013-12-22 MED ORDER — PREDNISONE (PAK) 10 MG PO TABS: ORAL_TABLET | Freq: Every day | ORAL | Status: DC

## 2013-12-22 NOTE — ED Provider Notes (Signed)
CSN: [id number]     Arrival date & time 12/22/13  1426 History   This chart is scribed for non-physician practitioner, Trixie DredgeEmily Zaki Gertsch, PA-C, working with Derwood KaplanAnkit Nanavati, MD by Abel PrestoKara Demonbreun, ED Scribe.  This patient was seen in room WTR8/WTR8 and the patient's care was started 3:11 PM.     Chief Complaint  Patient presents with  . Cellulitis   HPI HPI Comments: Alicia Pearson is a 28 y.o. female who presents to the Emergency Department complaining of an itchy skin irritation on left arm with onset yesterday morning. She states she noticed an increase in itchiness, swelling and redness over the past couple of hours. She notes that something may have bitten her. She states she has cleaned the area with alcohol. She denies any allergies, changes in detergent, soaps, lotions or personal care products, spider infestation in home, fever, sore throat, swelling or itching of the mouth or throat, difficulty swallowing or breathing.  There is no pain associated with the left arm lesion.    Past Medical History  Diagnosis Date  . Tachycardia     history, not on meds  . Urinary tract infection   . Eczema   . Depression     no meds, doing ok  . Medical history non-contributory   . Anxiety   . HSV (herpes simplex virus) anogenital infection     positive blood test, but no breakouts per pt   Past Surgical History  Procedure Laterality Date  . Wisdom tooth extraction     Family History  Problem Relation Age of Onset  . Cancer Mother     breast 2003  . Hypertension Mother   . Aneurysm Mother     x2  . Alcohol abuse Father   . Alcohol abuse Brother   . Alzheimer's disease Maternal Grandmother    History  Substance Use Topics  . Smoking status: Current Every Day Smoker -- 0.25 packs/day for 8 years    Types: Cigarettes    Start date: 02/03/2001  . Smokeless tobacco: Never Used     Comment: 6-7 cigarettes/day  . Alcohol Use: No   OB History    Gravida Para Term Preterm AB TAB SAB  Ectopic Multiple Living   5 5 3 2     1 6      Review of Systems  Constitutional: Negative for fever.  HENT: Negative for rhinorrhea, sore throat, trouble swallowing and voice change.   Respiratory: Negative for cough and shortness of breath.   Skin: Positive for color change and rash.  Allergic/Immunologic: Negative for immunocompromised state.  Psychiatric/Behavioral: Negative for self-injury.      Allergies  Review of patient's allergies indicates no known allergies.  Home Medications   Prior to Admission medications   Medication Sig Start Date End Date Taking? Authorizing Provider  nicotine (EQL NICOTINE) 14 mg/24hr patch Place 1 patch (14 mg total) onto the skin daily. 11/17/13   Sanjuana LettersWilliam Arthur Hensel, MD  nicotine (EQL NICOTINE) 7 mg/24hr patch Place 1 patch (7 mg total) onto the skin daily. 11/01/13   Jamal CollinJames R Joyner, MD  Prenatal Vit-Fe Fumarate-FA (PRENATAL MULTIVITAMIN) TABS tablet Take 1 tablet by mouth daily at 12 noon. 11/01/13   Jamal CollinJames R Joyner, MD   BP 123/75 mmHg  Pulse 99  Temp(Src) 98.1 F (36.7 C) (Oral)  Resp 16  SpO2 100% Physical Exam  Constitutional: She is oriented to person, place, and time. She appears well-developed and well-nourished. No distress.  HENT:  Head: Normocephalic  and atraumatic.  Mouth/Throat: Oropharynx is clear and moist. No oropharyngeal exudate.  Eyes: Conjunctivae are normal.  Neck: Normal range of motion. Neck supple.  Cardiovascular: Normal rate, regular rhythm, normal heart sounds and intact distal pulses.   Pulmonary/Chest: Effort normal and breath sounds normal. No stridor. No respiratory distress. She has no wheezes. She has no rales.  Musculoskeletal: Normal range of motion.  Neurological: She is alert and oriented to person, place, and time.  Skin: Skin is warm and dry. She is not diaphoretic.  Left arm with single area over medial upper arm-erythema, edema, warmth, non-tender, no induration or fluctuance.  No edema with  rest of the arm  Psychiatric: She has a normal mood and affect. Her behavior is normal.  Nursing note and vitals reviewed.   ED Course  Procedures (including critical care time) DIAGNOSTIC STUDIES: Oxygen Saturation is 100% on room air, normal by my interpretation.    COORDINATION OF CARE: 3:15 PM Discussed treatment plan with patient at beside, the patient agrees with the plan and has no further questions at this time.   Labs Review Labs Reviewed - No data to display  Imaging Review No results found.   EKG Interpretation None      MDM   Final diagnoses:  Allergic reaction, initial encounter   Afebrile, nontoxic patient with single patch of erythema, edema, and warmth that is nontender.  Pt notes it is very pruritic.  No pain.  No other other allergic symptoms.  Suspect localized allergic reaction to insect bite or other exposure.  No airway concerns.  Doubt cellulitis.  No e/o abscess.   D/C home with prednisone, benadryl.   Discussed result, findings, treatment, and follow up  with patient.  Pt given return precautions.  Pt verbalizes understanding and agrees with plan.       I personally performed the services described in this documentation, which was scribed in my presence. The recorded information has been reviewed and is accurate.     Eddy Liszewski, Trixie Dredge-C 12/22/13 1545  Derwood KaplanAnkit Nanavati, MD 12/22/13 2157

## 2013-12-22 NOTE — ED Notes (Addendum)
Pt reports itching skin irritation on left arm above elbow. Pt denies pain or fever. Pt presents with skin irritation and obvious red and swollen area above left  elbow

## 2013-12-22 NOTE — Discharge Instructions (Signed)
Read the information below.  Use the prescribed medication as directed.  Please discuss all new medications with your pharmacist.  You may return to the Emergency Department at any time for worsening condition or any new symptoms that concern you.    If you develop pain in your arm, difficulty swallowing or breathing, fevers, or significant increase in redness and swelling, return to the Emergency Department for a recheck.    Allergies Allergies may happen from anything your body is sensitive to. This may be food, medicines, pollens, chemicals, and nearly anything around you in everyday life that produces allergens. An allergen is anything that causes an allergy producing substance. Heredity is often a factor in causing these problems. This means you may have some of the same allergies as your parents. Food allergies happen in all age groups. Food allergies are some of the most severe and life threatening. Some common food allergies are cow's milk, seafood, eggs, nuts, wheat, and soybeans. SYMPTOMS   Swelling around the mouth.  An itchy red rash or hives.  Vomiting or diarrhea.  Difficulty breathing. SEVERE ALLERGIC REACTIONS ARE LIFE-THREATENING. This reaction is called anaphylaxis. It can cause the mouth and throat to swell and cause difficulty with breathing and swallowing. In severe reactions only a trace amount of food (for example, peanut oil in a salad) may cause death within seconds. Seasonal allergies occur in all age groups. These are seasonal because they usually occur during the same season every year. They may be a reaction to molds, grass pollens, or tree pollens. Other causes of problems are house dust mite allergens, pet dander, and mold spores. The symptoms often consist of nasal congestion, a runny itchy nose associated with sneezing, and tearing itchy eyes. There is often an associated itching of the mouth and ears. The problems happen when you come in contact with pollens and  other allergens. Allergens are the particles in the air that the body reacts to with an allergic reaction. This causes you to release allergic antibodies. Through a chain of events, these eventually cause you to release histamine into the blood stream. Although it is meant to be protective to the body, it is this release that causes your discomfort. This is why you were given anti-histamines to feel better. If you are unable to pinpoint the offending allergen, it may be determined by skin or blood testing. Allergies cannot be cured but can be controlled with medicine. Hay fever is a collection of all or some of the seasonal allergy problems. It may often be treated with simple over-the-counter medicine such as diphenhydramine. Take medicine as directed. Do not drink alcohol or drive while taking this medicine. Check with your caregiver or package insert for child dosages. If these medicines are not effective, there are many new medicines your caregiver can prescribe. Stronger medicine such as nasal spray, eye drops, and corticosteroids may be used if the first things you try do not work well. Other treatments such as immunotherapy or desensitizing injections can be used if all else fails. Follow up with your caregiver if problems continue. These seasonal allergies are usually not life threatening. They are generally more of a nuisance that can often be handled using medicine. HOME CARE INSTRUCTIONS   If unsure what causes a reaction, keep a diary of foods eaten and symptoms that follow. Avoid foods that cause reactions.  If hives or rash are present:  Take medicine as directed.  You may use an over-the-counter antihistamine (diphenhydramine) for hives and  itching as needed.  Apply cold compresses (cloths) to the skin or take baths in cool water. Avoid hot baths or showers. Heat will make a rash and itching worse.  If you are severely allergic:  Following a treatment for a severe reaction,  hospitalization is often required for closer follow-up.  Wear a medic-alert bracelet or necklace stating the allergy.  You and your family must learn how to give adrenaline or use an anaphylaxis kit.  If you have had a severe reaction, always carry your anaphylaxis kit or EpiPen with you. Use this medicine as directed by your caregiver if a severe reaction is occurring. Failure to do so could have a fatal outcome. SEEK MEDICAL CARE IF:  You suspect a food allergy. Symptoms generally happen within 30 minutes of eating a food.  Your symptoms have not gone away within 2 days or are getting worse.  You develop new symptoms.  You want to retest yourself or your child with a food or drink you think causes an allergic reaction. Never do this if an anaphylactic reaction to that food or drink has happened before. Only do this under the care of a caregiver. SEEK IMMEDIATE MEDICAL CARE IF:   You have difficulty breathing, are wheezing, or have a tight feeling in your chest or throat.  You have a swollen mouth, or you have hives, swelling, or itching all over your body.  You have had a severe reaction that has responded to your anaphylaxis kit or an EpiPen. These reactions may return when the medicine has worn off. These reactions should be considered life threatening. MAKE SURE YOU:   Understand these instructions.  Will watch your condition.  Will get help right away if you are not doing well or get worse. Document Released: 04/15/2002 Document Revised: 05/17/2012 Document Reviewed: 09/20/2007 [facility] Patient Information 2015 [place], [place]. This information is not intended to replace advice given to you by your health care provider. Make sure you discuss any questions you have with your health care provider.  Rash A rash is a change in the color or texture of your skin. There are many different types of rashes. You may have other problems that accompany your rash. CAUSES    Infections.  Allergic reactions. This can include allergies to pets or foods.  Certain medicines.  Exposure to certain chemicals, soaps, or cosmetics.  Heat.  Exposure to poisonous plants.  Tumors, both cancerous and noncancerous. SYMPTOMS   Redness.  Scaly skin.  Itchy skin.  Dry or cracked skin.  Bumps.  Blisters.  Pain. DIAGNOSIS  Your caregiver may do a physical exam to determine what type of rash you have. A skin sample (biopsy) may be taken and examined under a microscope. TREATMENT  Treatment depends on the type of rash you have. Your caregiver may prescribe certain medicines. For serious conditions, you may need to see a skin doctor (dermatologist). HOME CARE INSTRUCTIONS   Avoid the substance that caused your rash.  Do not scratch your rash. This can cause infection.  You may take cool baths to help stop itching.  Only take over-the-counter or prescription medicines as directed by your caregiver.  Keep all follow-up appointments as directed by your caregiver. SEEK IMMEDIATE MEDICAL CARE IF:  You have increasing pain, swelling, or redness.  You have a fever.  You have new or severe symptoms.  You have body aches, diarrhea, or vomiting.  Your rash is not better after 3 days. MAKE SURE YOU:  Understand these instructions.  Will watch your condition.  Will get help right away if you are not doing well or get worse. Document Released: 01/10/2002 Document Revised: 04/14/2011 Document Reviewed: 11/04/2010 [facility] Patient Information 2015 [place], [place]. This information is not intended to replace advice given to you by your health care provider. Make sure you discuss any questions you have with your health care provider.

## 2014-01-02 ENCOUNTER — Ambulatory Visit: Payer: Medicaid Other | Admitting: Pharmacist

## 2014-02-28 ENCOUNTER — Ambulatory Visit: Payer: Medicaid Other | Admitting: Obstetrics and Gynecology

## 2014-03-01 ENCOUNTER — Ambulatory Visit: Payer: Medicaid Other | Admitting: Physician Assistant

## 2014-04-02 IMAGING — US US UA CORD DOPPLER
1 series · 12 of 28 positions shown · non-contrast
Comparison: none

[Series 1: us ua cord doppler · 0.23mm/px · 12 of 28 slices shown]
[im 2/28]
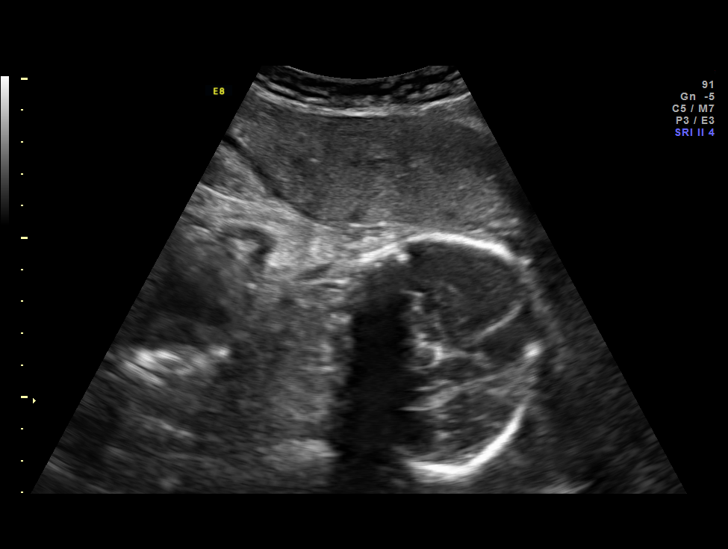
[im 4/28]
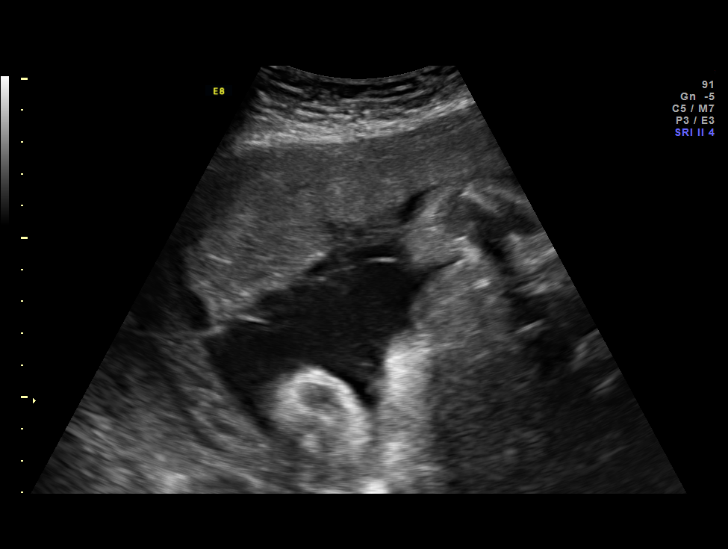
[im 6/28]
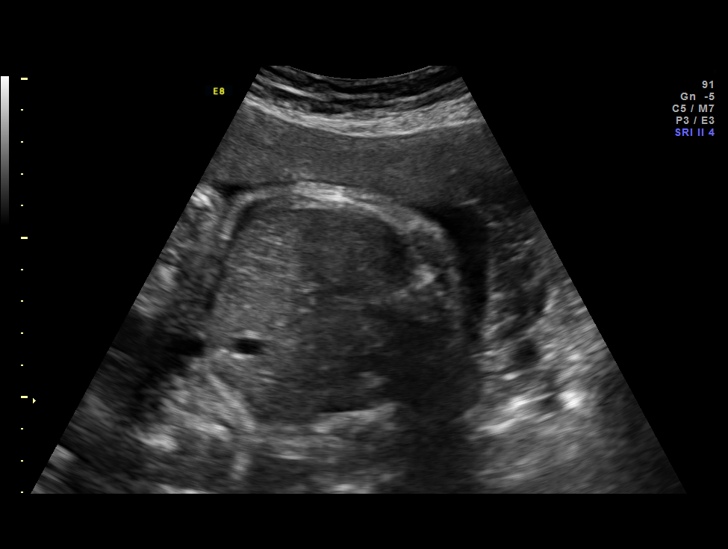
[im 9/28]
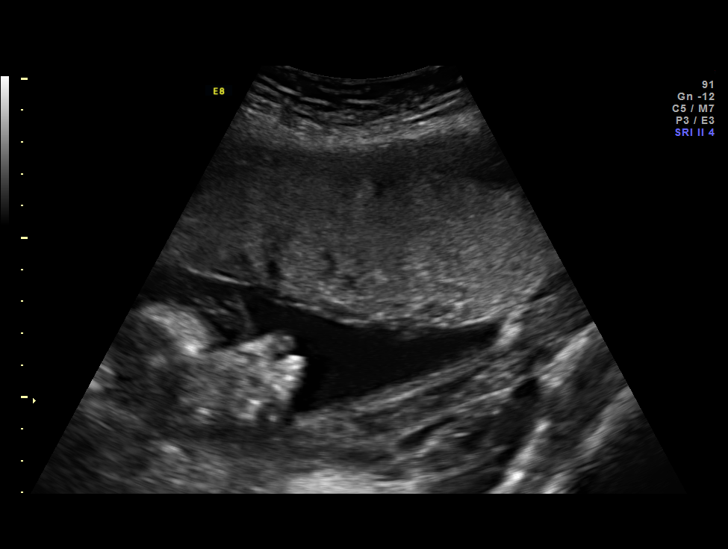
[im 11/28]
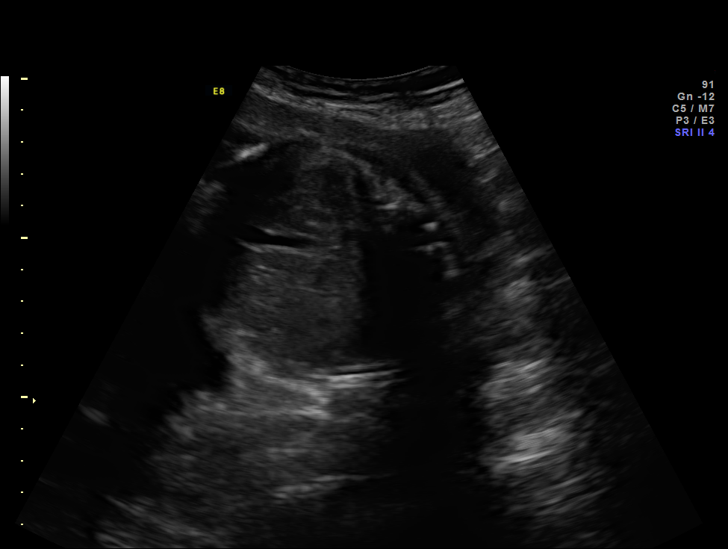
[im 13/28]
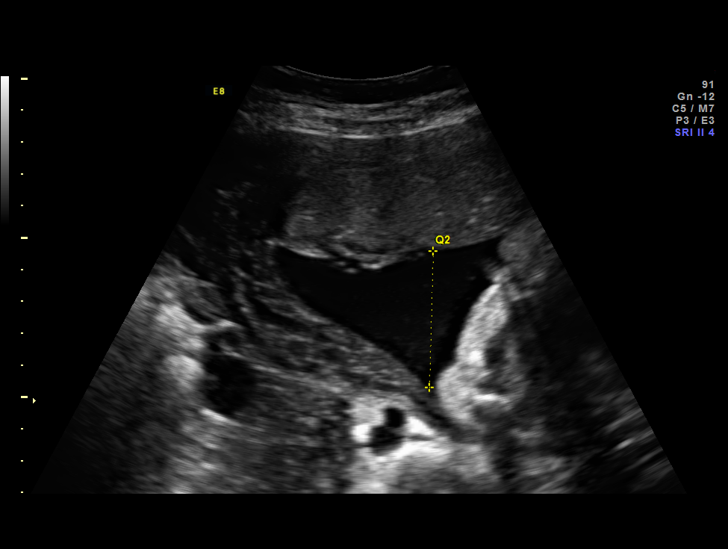
[im 16/28]
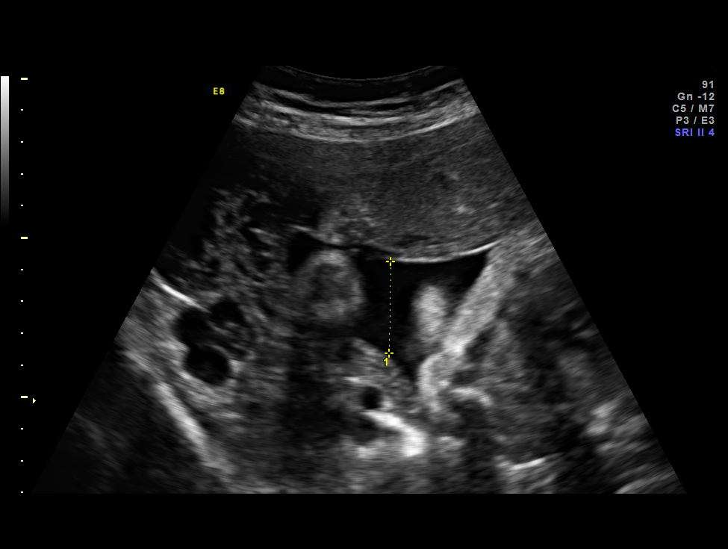
[im 18/28]
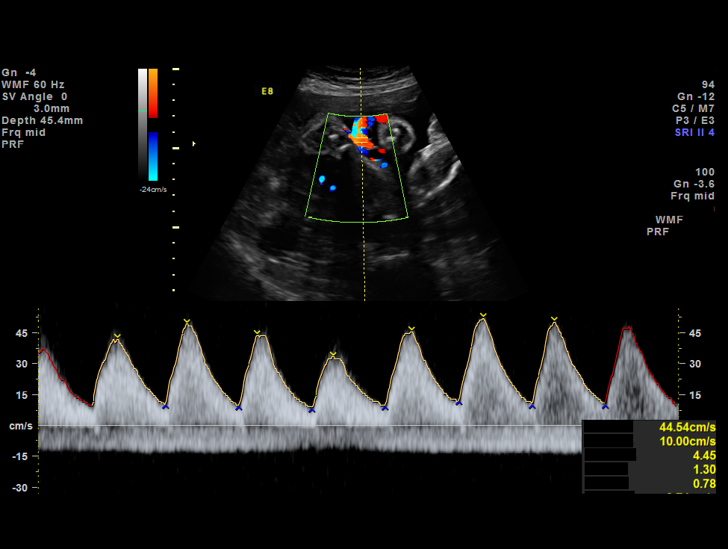
[im 20/28]
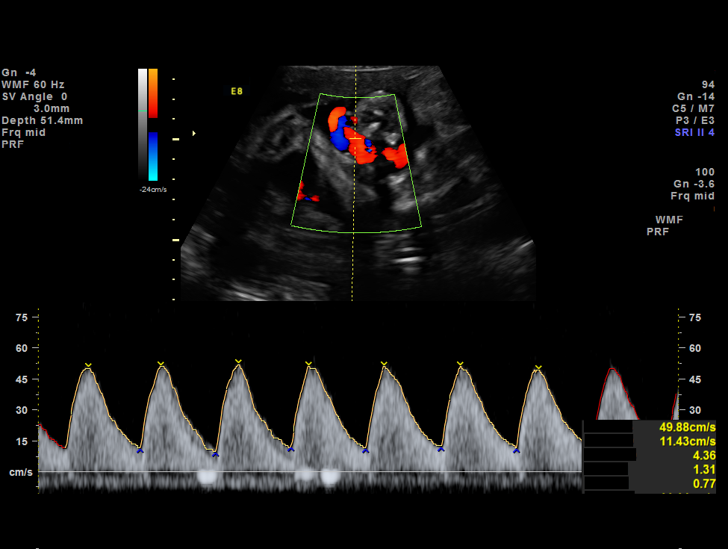
[im 23/28]
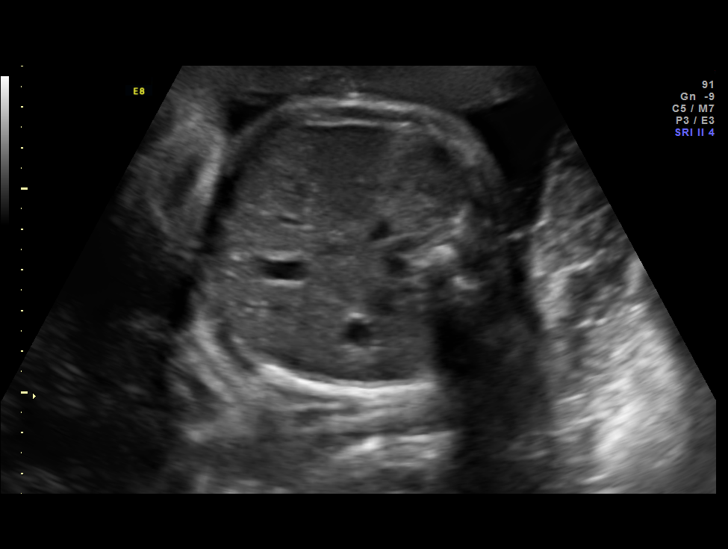
[im 25/28]
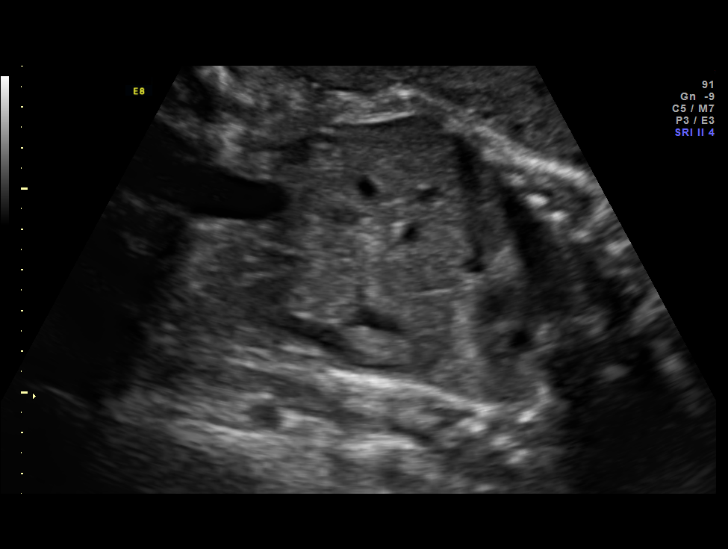
[im 27/28]
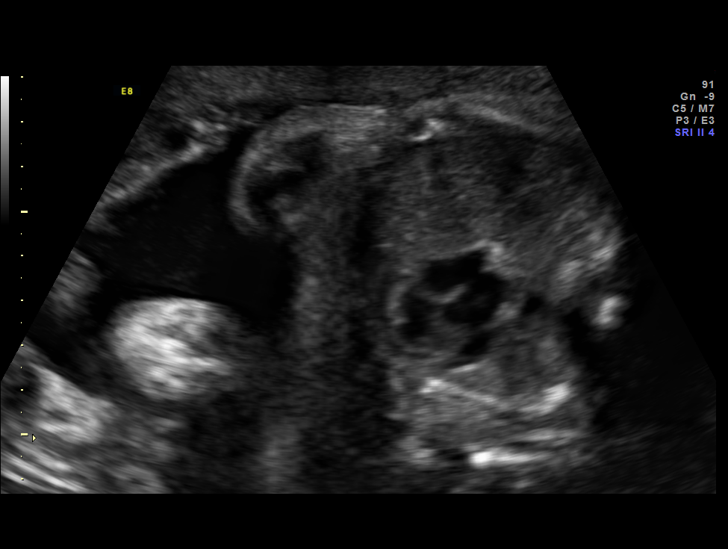

[12 of 28 positions shown; findings below may reference images not displayed]

OBSTETRICS REPORT
                      (Signed Final 02/26/2012 [DATE])

             DARREN A

Service(s) Provided

 US UA CORD DOPPLER                                    76820.0
Indications

 Trisomy 21 (Down syndrome) - Positive Harmony
 Echogenic intracardiac focus of the heart  (EIF)
 Fetal abnormality - other known or suspected
 (thickened nuchal fold)
 Cigarette smoker
Fetal Evaluation

 Num Of Fetuses:    1
 Fetal Heart Rate:  141                          bpm
 Cardiac Activity:  Observed
 Presentation:      Cephalic
 Placenta:          Anterior, above cervical os
 P. Cord            Previously Visualized
 Insertion:

 Amniotic Fluid
 AFI FV:      Subjectively within normal limits
 AFI Sum:     11.95   cm       30  %Tile     Larg Pckt:    4.23  cm
 RUQ:   3.51    cm   RLQ:    1.33   cm    LUQ:   2.88    cm   LLQ:    4.23   cm
Biophysical Evaluation

 Amniotic F.V:   Within normal limits       F. Tone:        Observed
 F. Movement:    Observed                   Score:          [DATE]
 F. Breathing:   Observed
Gestational Age

 LMP:           29w 5d        Date:  08/02/11                 EDD:   05/08/12
 Best:          30w 3d     Det. By:  U/S C R L   (10/01/11)   EDD:   05/03/12
Anatomy

 Cranium:          Previously seen        Aortic Arch:      Not well visualized
 Fetal Cavum:      Previously seen        Ductal Arch:      Previously seen
 Ventricles:       Previously seen        Diaphragm:        Previously seen
 Choroid Plexus:   Previously seen        Stomach:          Previously Seen
 Cerebellum:       Previously seen        Abdomen:          Previously seen
 Posterior Fossa:  Previously seen        Abdominal Wall:   Previously seen
 Face:             Orbits and profile     Cord Vessels:     Previously seen
                   previously seen
 Lips:             Previously seen        Kidneys:          Previously seen
 Palate:           Previously seen        Bladder:          Previously seen
 Heart:            Echogenic focus        Spine:            Limited views
                   in LV
                                                            previously seen
 RVOT:             Previously seen        Lower             Previously seen
                                          Extremities:
 LVOT:             Previously seen        Upper             Previously seen
                                          Extremities:

 Other:  Heels and 5th digit previously seen. Female gender.
Doppler - Fetal Vessels

 Umbilical Artery
 S/D:   4.41       > 97.5  %tile       RI:
 PI:    1.31                           PSV:       52.27   cm/s

Cervix Uterus Adnexa

 Cervix:       Not visualized (advanced GA >09wks)
Impression

 Single IUP at 30 [DATE] weeks
 Trisomy 21 by NIPT.  Lagging AC noted on previous
 ultrasound.
 Umbilical artery Doppler studies elevated (97th %tile for
 gestational age) without evidence of absent or reversed
 diastolic flow
 The fetus is active with a BPP of [DATE]
 Normal amniotic fluid volume
Recommendations

 Recommend continued weeky BPPs with UA Dopplers.
 Follow up growth scan in 2 weeks.

## 2014-04-26 ENCOUNTER — Encounter (HOSPITAL_COMMUNITY): Payer: Self-pay | Admitting: *Deleted

## 2014-04-26 ENCOUNTER — Emergency Department (HOSPITAL_COMMUNITY)
Admission: EM | Admit: 2014-04-26 | Discharge: 2014-04-26 | Discharge status: Against Medical Advice | Payer: Medicaid Other | Attending: Emergency Medicine | Admitting: Emergency Medicine

## 2014-04-26 DIAGNOSIS — Z720 Tobacco use: Secondary | ICD-10-CM | POA: Diagnosis not present

## 2014-04-26 DIAGNOSIS — R110 Nausea: Secondary | ICD-10-CM | POA: Diagnosis not present

## 2014-04-26 DIAGNOSIS — R51 Headache: Secondary | ICD-10-CM | POA: Insufficient documentation

## 2014-04-26 NOTE — ED Notes (Signed)
Pt reports left side headache since yesterday with nausea. Denies hx of migraines. Has not taken any meds at home. No distress noted at triage.

## 2014-04-28 IMAGING — US US FETAL BPP W/O NONSTRESS
1 series · 13 of 20 positions shown · non-contrast
Comparison: none

[Series 1: us fetal bpp w/o nonstress · 13 of 20 slices shown]
[im 1/20]
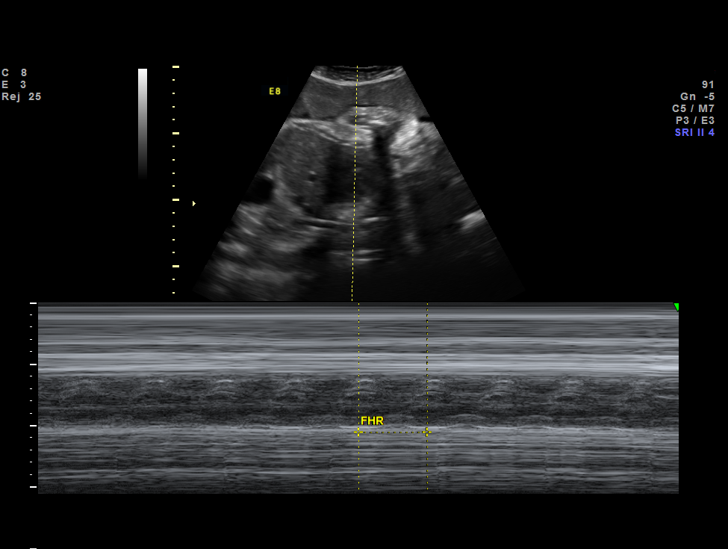
[im 3/20]
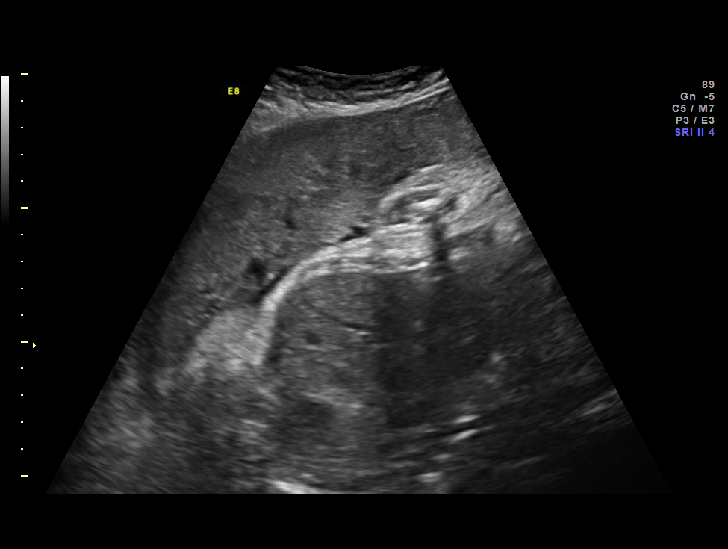
[im 4/20]
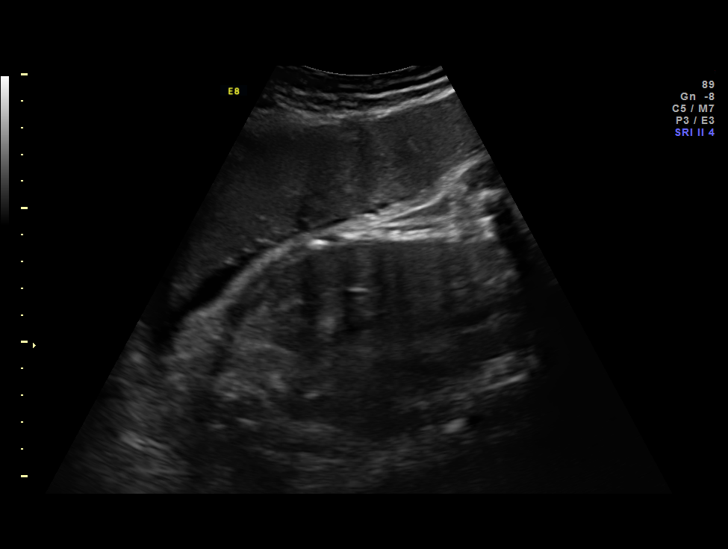
[im 6/20]
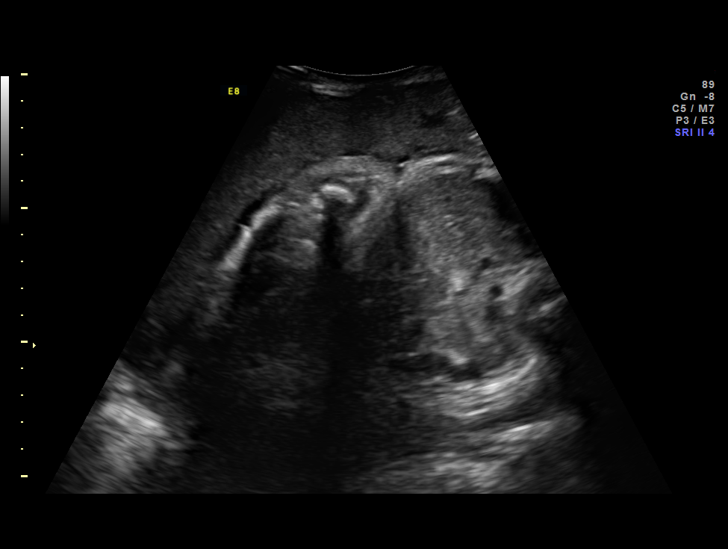
[im 7/20]
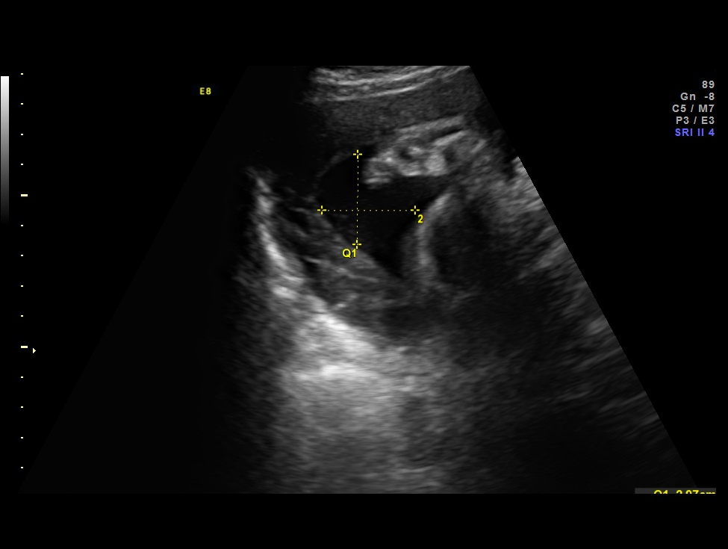
[im 9/20]
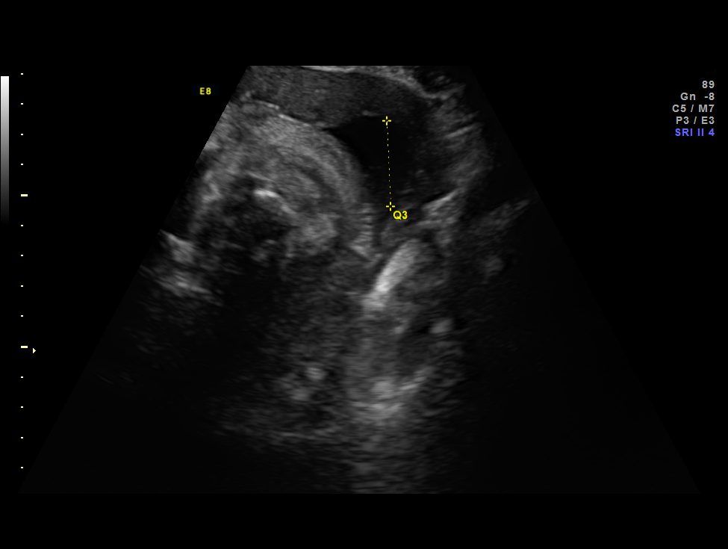
[im 11/20]
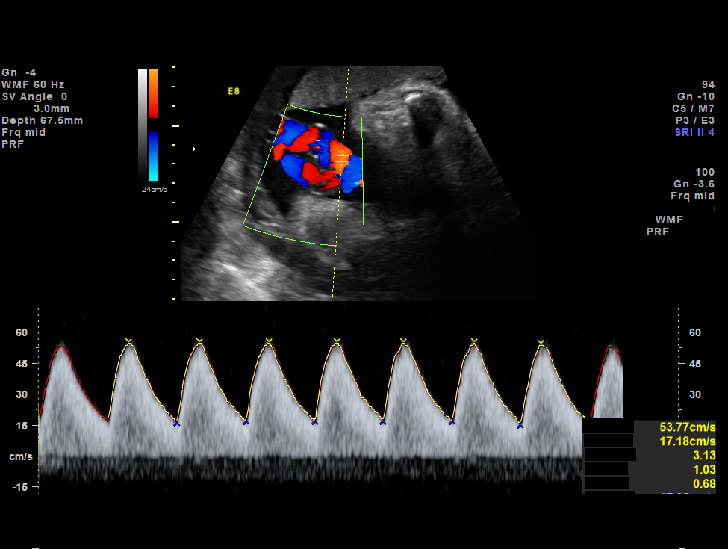
[im 12/20]
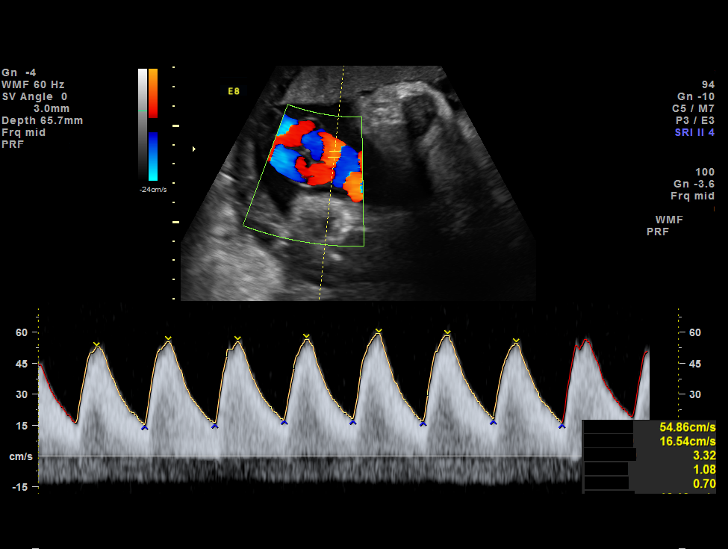
[im 14/20]
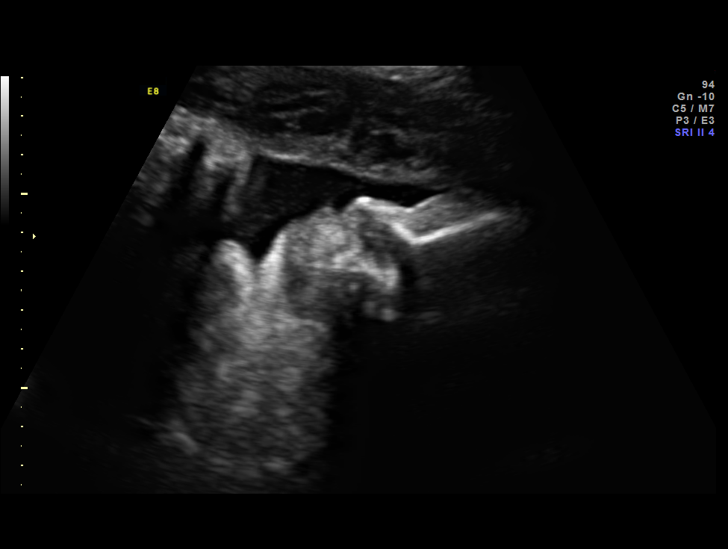
[im 15/20]
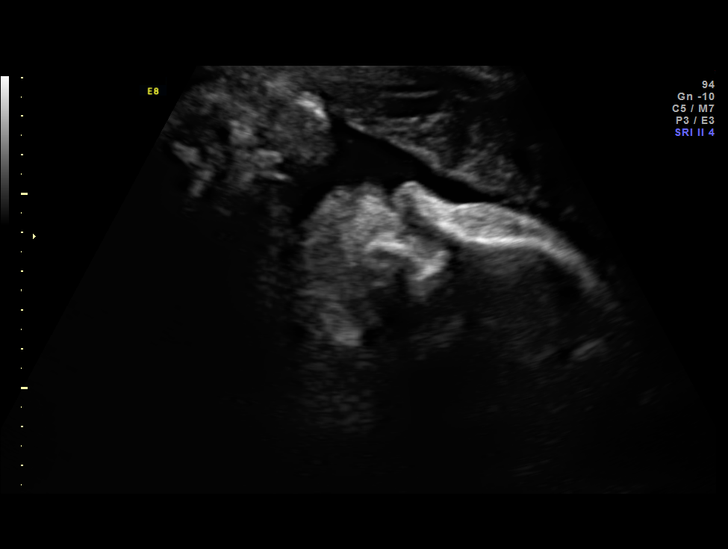
[im 17/20]
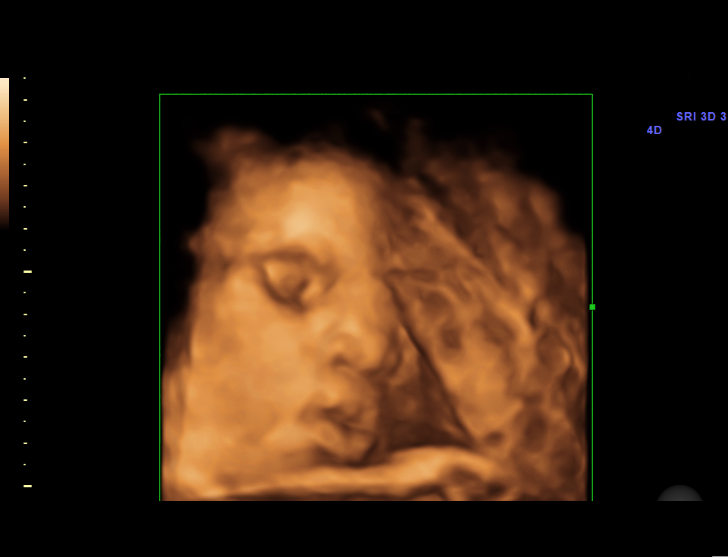
[im 18/20]
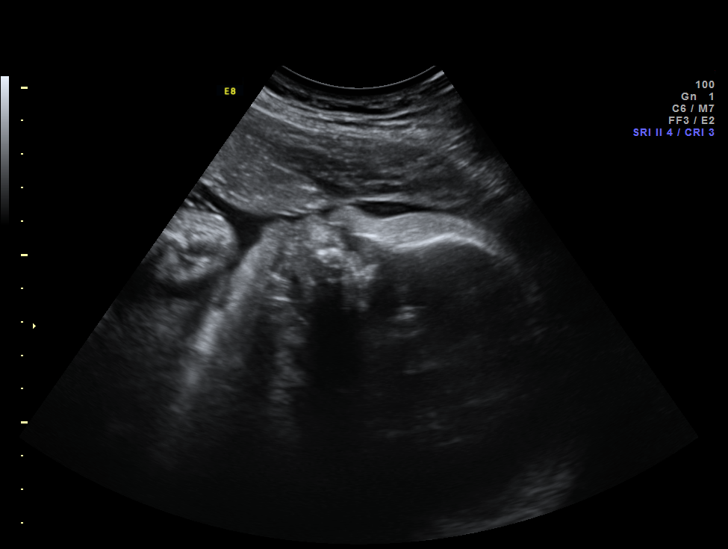
[im 20/20]
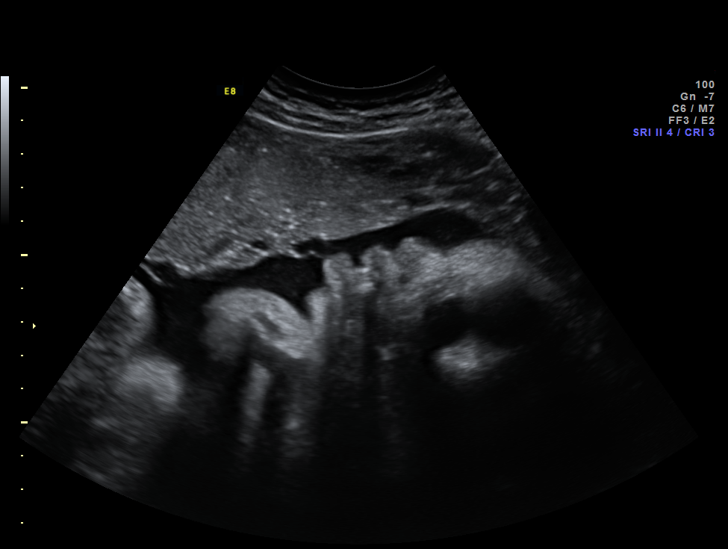

[13 of 20 positions shown; findings below may reference images not displayed]

OBSTETRICS REPORT
                      (Signed Final 03/23/2012 [DATE])

 Name:       BEHIDA NINGUNO                     Visit Date: 03/23/2012 [DATE]
             MICELI

Service(s) Provided

 US UA CORD DOPPLER                                    76820.0
Indications

 Trisomy 21 (Down syndrome) - Positive Harmony
 Echogenic intracardiac focus of the heart  (EIF)
 Cigarette smoker
Fetal Evaluation

 Num Of Fetuses:    1
 Fetal Heart Rate:  147                          bpm
 Cardiac Activity:  Observed
 Presentation:      Cephalic
 Placenta:          Anterior, above cervical os
 P. Cord            Previously Visualized
 Insertion:

 Amniotic Fluid
 AFI FV:      Subjectively within normal limits
 AFI Sum:     9.19    cm       13  %Tile     Larg Pckt:    2.97  cm
 RUQ:   2.97    cm   RLQ:    1.45   cm    LUQ:   1.94    cm   LLQ:    2.83   cm
Biophysical Evaluation

 Amniotic F.V:   Within normal limits       F. Tone:        Observed
 F. Movement:    Observed                   Score:          [DATE]
 F. Breathing:   Observed
Gestational Age

 LMP:           33w 3d        Date:  08/02/11                 EDD:   05/08/12
 Best:          34w 1d     Det. By:  U/S C R L   (10/01/11)   EDD:   05/03/12
Doppler - Fetal Vessels

 Umbilical Artery
 S/D:   3.23           83  %tile       RI:
 PI:    1.08                           PSV:       54.86   cm/s
Cervix Uterus Adnexa

 Cervix:       Not visualized (advanced GA >09wks)
Impression

 IUP at 34+2 weeks
 Fetus: screen positive for T21
 Normal amniotic fluid volume
 BPP [DATE]
 UA dopplers were in the high normal range for this GA
Recommendations

 Growth US, BPP and UA dopplers next week

## 2015-02-17 ENCOUNTER — Emergency Department (HOSPITAL_BASED_OUTPATIENT_CLINIC_OR_DEPARTMENT_OTHER)
Admission: EM | Admit: 2015-02-17 | Discharge: 2015-02-17 | Disposition: A | Discharge status: Home / Self Care | Payer: Medicaid Other | Attending: Emergency Medicine | Admitting: Emergency Medicine

## 2015-02-17 ENCOUNTER — Emergency Department (HOSPITAL_BASED_OUTPATIENT_CLINIC_OR_DEPARTMENT_OTHER): Payer: Medicaid Other

## 2015-02-17 ENCOUNTER — Encounter (HOSPITAL_BASED_OUTPATIENT_CLINIC_OR_DEPARTMENT_OTHER): Payer: Self-pay | Admitting: Emergency Medicine

## 2015-02-17 DIAGNOSIS — B9689 Other specified bacterial agents as the cause of diseases classified elsewhere: Secondary | ICD-10-CM

## 2015-02-17 DIAGNOSIS — Z87440 Personal history of urinary (tract) infections: Secondary | ICD-10-CM | POA: Diagnosis not present

## 2015-02-17 DIAGNOSIS — B3731 Acute candidiasis of vulva and vagina: Secondary | ICD-10-CM

## 2015-02-17 DIAGNOSIS — Z3A09 9 weeks gestation of pregnancy: Secondary | ICD-10-CM | POA: Diagnosis not present

## 2015-02-17 DIAGNOSIS — Z3490 Encounter for supervision of normal pregnancy, unspecified, unspecified trimester: Secondary | ICD-10-CM

## 2015-02-17 DIAGNOSIS — R109 Unspecified abdominal pain: Secondary | ICD-10-CM

## 2015-02-17 DIAGNOSIS — N898 Other specified noninflammatory disorders of vagina: Secondary | ICD-10-CM

## 2015-02-17 DIAGNOSIS — B373 Candidiasis of vulva and vagina: Secondary | ICD-10-CM | POA: Diagnosis not present

## 2015-02-17 DIAGNOSIS — O99331 Smoking (tobacco) complicating pregnancy, first trimester: Secondary | ICD-10-CM | POA: Insufficient documentation

## 2015-02-17 DIAGNOSIS — O98811 Other maternal infectious and parasitic diseases complicating pregnancy, first trimester: Secondary | ICD-10-CM | POA: Insufficient documentation

## 2015-02-17 DIAGNOSIS — F17210 Nicotine dependence, cigarettes, uncomplicated: Secondary | ICD-10-CM | POA: Diagnosis not present

## 2015-02-17 DIAGNOSIS — O30001 Twin pregnancy, unspecified number of placenta and unspecified number of amniotic sacs, first trimester: Secondary | ICD-10-CM | POA: Diagnosis not present

## 2015-02-17 DIAGNOSIS — O23591 Infection of other part of genital tract in pregnancy, first trimester: Secondary | ICD-10-CM | POA: Diagnosis not present

## 2015-02-17 DIAGNOSIS — N760 Acute vaginitis: Secondary | ICD-10-CM

## 2015-02-17 DIAGNOSIS — Z79899 Other long term (current) drug therapy: Secondary | ICD-10-CM | POA: Diagnosis not present

## 2015-02-17 DIAGNOSIS — Z872 Personal history of diseases of the skin and subcutaneous tissue: Secondary | ICD-10-CM | POA: Insufficient documentation

## 2015-02-17 DIAGNOSIS — Z8659 Personal history of other mental and behavioral disorders: Secondary | ICD-10-CM | POA: Insufficient documentation

## 2015-02-17 DIAGNOSIS — O9989 Other specified diseases and conditions complicating pregnancy, childbirth and the puerperium: Secondary | ICD-10-CM | POA: Diagnosis present

## 2015-02-17 LAB — BASIC METABOLIC PANEL
ANION GAP: 7 (ref 5–15)
BUN: 9 mg/dL (ref 6–20)
CHLORIDE: 106 mmol/L (ref 101–111)
CO2: 22 mmol/L (ref 22–32)
Calcium: 8.6 mg/dL — ABNORMAL LOW (ref 8.9–10.3)
Creatinine, Ser: 0.62 mg/dL (ref 0.44–1.00)
GFR calc Af Amer: >60 mL/min (ref >60)
GLUCOSE: 79 mg/dL (ref 65–99)
POTASSIUM: 3.8 mmol/L (ref 3.5–5.1)
Sodium: 135 mmol/L (ref 135–145)

## 2015-02-17 LAB — CBC WITH DIFFERENTIAL/PLATELET
BASOS ABS: 0 10*3/uL (ref 0.0–0.1)
Basophils Relative: 0 %
Eosinophils Absolute: 0 10*3/uL (ref 0.0–0.7)
Eosinophils Relative: 0 %
HEMATOCRIT: 38.6 % (ref 36.0–46.0)
Hemoglobin: 13 g/dL (ref 12.0–15.0)
LYMPHS ABS: 2.2 10*3/uL (ref 0.7–4.0)
LYMPHS PCT: 22 %
MCH: 31.6 pg (ref 26.0–34.0)
MCHC: 33.7 g/dL (ref 30.0–36.0)
MCV: 93.7 fL (ref 78.0–100.0)
Monocytes Absolute: 0.9 10*3/uL (ref 0.1–1.0)
Monocytes Relative: 10 %
Neutro Abs: 6.7 10*3/uL (ref 1.7–7.7)
Neutrophils Relative %: 68 %
Platelets: 301 10*3/uL (ref 150–400)
RBC: 4.12 MIL/uL (ref 3.87–5.11)
RDW: 12.5 % (ref 11.5–15.5)
WBC: 9.9 10*3/uL (ref 4.0–10.5)

## 2015-02-17 LAB — WET PREP, GENITAL
Sperm: NONE SEEN
Trich, Wet Prep: NONE SEEN

## 2015-02-17 LAB — URINALYSIS, ROUTINE W REFLEX MICROSCOPIC
Bilirubin Urine: NEGATIVE
Glucose, UA: NEGATIVE mg/dL
Hgb urine dipstick: NEGATIVE
KETONES UR: NEGATIVE mg/dL
LEUKOCYTES UA: NEGATIVE
Nitrite: NEGATIVE
PROTEIN: NEGATIVE mg/dL
SPECIFIC GRAVITY, URINE: 1.016 (ref 1.005–1.030)
pH: 6 (ref 5.0–8.0)

## 2015-02-17 LAB — ABO/RH: ABO/RH(D): B POS

## 2015-02-17 LAB — HCG, QUANTITATIVE, PREGNANCY: hCG, Beta Chain, Quant, S: 283522 m[IU]/mL — ABNORMAL HIGH (ref <5)

## 2015-02-17 LAB — PREGNANCY, URINE: Preg Test, Ur: POSITIVE — AB

## 2015-02-17 LAB — OB RESULTS CONSOLE GC/CHLAMYDIA: Gonorrhea: NEGATIVE

## 2015-02-17 MED ORDER — ACETAMINOPHEN 325 MG PO TABS
650.0000 mg | ORAL_TABLET | Freq: Once | ORAL | Status: AC
  Administered 2015-02-17: 650 mg via ORAL
  Filled 2015-02-17: qty 2

## 2015-02-17 MED ORDER — METRONIDAZOLE 500 MG PO TABS: 500.0000 mg | ORAL_TABLET | Freq: Three times a day (TID) | ORAL | Status: DC

## 2015-02-17 MED ORDER — PRENATAL COMPLETE 14-0.4 MG PO TABS: 1.0000 | ORAL_TABLET | Freq: Every day | ORAL | Status: DC

## 2015-02-17 MED ORDER — CLOTRIMAZOLE 1 % VA CREA: 1.0000 | TOPICAL_CREAM | Freq: Every day | VAGINAL | Status: AC

## 2015-02-17 NOTE — ED Provider Notes (Signed)
CSN: [id number]   Arrival date & time 02/17/15 1317  History  By signing my name below, I, Bethel Born, attest that this documentation has been prepared under the direction and in the presence of Glynn Octave, MD. Electronically Signed: Bethel Born, ED Scribe. 02/17/2015. 5:39 PM  Chief Complaint  Patient presents with  . Vaginal Discharge    HPI The history is provided by the patient. No language interpreter was used.   Alicia Pearson is a 30 y.o. female who presents to the Emergency Department complaining of green/brown and malodorous vaginal dicharge with onset 2 weeks ago. Associated symptoms include nausea and intermittent mild left sided headache (none at present). Pt denies abdominal pain, dysuria, vomiting, weakness, numbness, tingling, phonophobia. Pt states that she stopped having the Depo Shot in June and that her menstrual periods have been irregular since that time. She has also been bleeding intermittently for the last 2 week but denies current bleeding. Pt has had no abdominal surgeries.   Pt also complaions of intermittent palpitations (none at present). She denies chest pain and SOB.  Past Medical History  Diagnosis Date  . Tachycardia     history, not on meds  . Urinary tract infection   . Eczema   . Depression     no meds, doing ok  . Medical history non-contributory   . Anxiety   . HSV (herpes simplex virus) anogenital infection     positive blood test, but no breakouts per pt    Past Surgical History  Procedure Laterality Date  . Wisdom tooth extraction      Family History  Problem Relation Age of Onset  . Cancer Mother     breast 2003  . Hypertension Mother   . Aneurysm Mother     x2  . Alcohol abuse Father   . Alcohol abuse Brother   . Alzheimer's disease Maternal Grandmother     Social History  Substance Use Topics  . Smoking status: Current Every Day Smoker -- 0.25 packs/day for 8 years    Types: Cigarettes    Start date:  02/03/2001  . Smokeless tobacco: Never Used     Comment: 6-7 cigarettes/day  . Alcohol Use: No     Review of Systems 10 Systems reviewed and all are negative for acute change except as noted in the HPI. Home Medications   Prior to Admission medications   Medication Sig Start Date End Date Taking? Authorizing Provider  diphenhydrAMINE (BENADRYL) 25 MG tablet Take 1 tablet (25 mg total) by mouth every 6 (six) hours as needed for itching or allergies. 12/22/13   Trixie Dredge, PA-C  nicotine (EQL NICOTINE) 14 mg/24hr patch Place 1 patch (14 mg total) onto the skin daily. 11/17/13   Moses Manners, MD  nicotine (EQL NICOTINE) 7 mg/24hr patch Place 1 patch (7 mg total) onto the skin daily. 11/01/13   Jamal Collin, MD  predniSONE (STERAPRED UNI-PAK) 10 MG tablet Take by mouth daily. Day 1: take 6 tabs.  Day 2: 5 tabs  Day 3: 4 tabs  Day 4: 3 tabs  Day 5: 2 tabs  Day 6: 1 tab 12/22/13   Trixie Dredge, PA-C  Prenatal Vit-Fe Fumarate-FA (PRENATAL MULTIVITAMIN) TABS tablet Take 1 tablet by mouth daily at 12 noon. 11/01/13   Jamal Collin, MD    Allergies  Review of patient's allergies indicates no known allergies.  Triage Vitals: BP 132/79 mmHg  Pulse 98  Temp(Src) 98.2 F (36.8 C) (Oral)  Resp 18  Ht 5\' 4"  (1.626 m)  Wt 192 lb (87.091 kg)  BMI 32.94 kg/m2  SpO2 100%  LMP 02/10/2015  Physical Exam  Constitutional: She is oriented to person, place, and time. She appears well-developed and well-nourished. No distress.  HENT:  Head: Normocephalic and atraumatic.  Mouth/Throat: Oropharynx is clear and moist. No oropharyngeal exudate.  Eyes: Conjunctivae and EOM are normal. Pupils are equal, round, and reactive to light.  Neck: Normal range of motion. Neck supple.  No meningismus.  Cardiovascular: Normal rate, regular rhythm, normal heart sounds and intact distal pulses.   No murmur heard. Pulmonary/Chest: Effort normal and breath sounds normal. No respiratory distress.  Abdominal: Soft.  There is no tenderness. There is no rebound and no guarding.  No CVA tenderness.  Genitourinary: Vaginal discharge found.  Chaperone present. Normal external genitalia. White discharge in vaginal vault. No CMT. No adnexal tenderness.  Musculoskeletal: Normal range of motion. She exhibits no edema or tenderness.  Neurological: She is alert and oriented to person, place, and time. No cranial nerve deficit. She exhibits normal muscle tone. Coordination normal.  No ataxia on finger to nose bilaterally. No pronator drift. 5/5 strength throughout. CN 2-12 intact.Equal grip strength. Sensation intact.   Skin: Skin is warm.  Psychiatric: She has a normal mood and affect. Her behavior is normal.  Nursing note and vitals reviewed.   ED Course  Procedures   DIAGNOSTIC STUDIES: Oxygen Saturation is 100% on RA, normal by my interpretation.    COORDINATION OF CARE: 3:13 PM Discussed treatment plan which includes lab work and an EKG with pt at bedside and pt agreed to plan.  3:53 PM I re-evaluated the patient and provided an update on the results of her pregnancy test. She is in agreement with the plan for [place].   5:37 PM-Consult complete with Dr. Debroah Loop (OB/GYN). Patient case explained and discussed.   5:39 PM I re-evaluated the patient and provided an update on the results of her [place] and my conversation with Dr. Debroah Loop.    Labs Reviewed  WET PREP, GENITAL - Abnormal; Notable for the following:    Yeast Wet Prep HPF POC PRESENT (*)    Clue Cells Wet Prep HPF POC PRESENT (*)    WBC, Wet Prep HPF POC MANY (*)    All other components within normal limits  PREGNANCY, URINE - Abnormal; Notable for the following:    Preg Test, Ur POSITIVE (*)    All other components within normal limits  BASIC METABOLIC PANEL - Abnormal; Notable for the following:    Calcium 8.6 (*)    All other components within normal limits  URINALYSIS, ROUTINE W REFLEX MICROSCOPIC (NOT AT [facility])  CBC WITH DIFFERENTIAL/PLATELET   HCG, QUANTITATIVE, PREGNANCY  ABO/RH  GC/CHLAMYDIA PROBE AMP ([place]) NOT AT [facility]    Imaging Review US Ob Comp Less 14 Wks  02/17/2015  CLINICAL DATA:  Vaginal bleeding for past 2 weeks. nausea and vomiting. Gestational age by LMP of 6 weeks 2 days. EXAM: TWIN OBSTETRIC <14WK [place] AND TRANSVAGINAL OB US COMPARISON:  None. FINDINGS: A living dichorionic twin IUP is seen. TWIN 1 Intrauterine gestational sac: Visualized/normal in shape. Yolk sac:  Visualized Embryo:  Visualized Cardiac Activity: Visualized Heart Rate: 169 bpm CRL:  20  mm   8 w 4 d                  [place] EDC: 09/25/2015 TWIN 2 Intrauterine gestational sac: Visualized/normal in shape. Yolk  sac:  Visualized Embryo:  Visualized Cardiac Activity: Visualized Heart Rate: 175 bpm CRL:  19  mm   8 w 3 d                  [place] EDC: 09/26/2015 Maternal uterus/adnexae: Tiny subchorionic hemorrhage seen adjacent to right-sided gestational sac. Both ovaries are normal in appearance. No mass or abnormal free fluid identified. IMPRESSION: Living dichorionic twin IUP with estimated gestational age of [redacted] weeks 4 days in [place] [facility] of 09/25/2015. Small subchorionic hemorrhage noted. Electronically Signed   By: Myles Rosenthal M.D.   On: 02/17/2015 17:17   US Ob Comp Addl Gest Less 14 Wks  02/17/2015  CLINICAL DATA:  Vaginal bleeding for past 2 weeks. nausea and vomiting. Gestational age by LMP of 6 weeks 2 days. EXAM: TWIN OBSTETRIC <14WK [place] AND TRANSVAGINAL OB US COMPARISON:  None. FINDINGS: A living dichorionic twin IUP is seen. TWIN 1 Intrauterine gestational sac: Visualized/normal in shape. Yolk sac:  Visualized Embryo:  Visualized Cardiac Activity: Visualized Heart Rate: 169 bpm CRL:  20  mm   8 w 4 d                  [place] EDC: 09/25/2015 TWIN 2 Intrauterine gestational sac: Visualized/normal in shape. Yolk sac:  Visualized Embryo:  Visualized Cardiac Activity: Visualized Heart Rate: 175 bpm CRL:  19  mm   8 w 3 d                  [place] EDC: 09/26/2015 Maternal  uterus/adnexae: Tiny subchorionic hemorrhage seen adjacent to right-sided gestational sac. Both ovaries are normal in appearance. No mass or abnormal free fluid identified. IMPRESSION: Living dichorionic twin IUP with estimated gestational age of [redacted] weeks 4 days in [place] [facility] of 09/25/2015. Small subchorionic hemorrhage noted. Electronically Signed   By: Myles Rosenthal M.D.   On: 02/17/2015 17:17   US Ob Transvaginal  02/17/2015  CLINICAL DATA:  Vaginal bleeding for past 2 weeks. nausea and vomiting. Gestational age by LMP of 6 weeks 2 days. EXAM: TWIN OBSTETRIC <14WK [place] AND TRANSVAGINAL OB US COMPARISON:  None. FINDINGS: A living dichorionic twin IUP is seen. TWIN 1 Intrauterine gestational sac: Visualized/normal in shape. Yolk sac:  Visualized Embryo:  Visualized Cardiac Activity: Visualized Heart Rate: 169 bpm CRL:  20  mm   8 w 4 d                  [place] EDC: 09/25/2015 TWIN 2 Intrauterine gestational sac: Visualized/normal in shape. Yolk sac:  Visualized Embryo:  Visualized Cardiac Activity: Visualized Heart Rate: 175 bpm CRL:  19  mm   8 w 3 d                  [place] EDC: 09/26/2015 Maternal uterus/adnexae: Tiny subchorionic hemorrhage seen adjacent to right-sided gestational sac. Both ovaries are normal in appearance. No mass or abnormal free fluid identified. IMPRESSION: Living dichorionic twin IUP with estimated gestational age of [redacted] weeks 4 days in [place] [facility] of 09/25/2015. Small subchorionic hemorrhage noted. Electronically Signed   By: Myles Rosenthal M.D.   On: 02/17/2015 17:17    I personally reviewed and evaluated these images and lab results as a part of my medical decision-making.    MDM   Final diagnoses:  Abdominal pain  Vaginal discharge   patient states 2 weeks of irregular periods with an remittent headaches, vaginal discharge and irregular bleeding. Denies any abdominal pain currently.  Denies any headache currently. No focal weakness, numbness or tingling.  Abdomen is soft without peritoneal  signs.  Patient found to be pregnant.  Ultrasound shows twin pregnancy IUP with gestational age 668 weeks. Small amount of subchorionic hemorrhage.  Patient with no bleeding on exam but has had irregular bleeding over the past couple weeks. Rh will be sent.  Results discussed with Dr. Debroah LoopArnold. He agrees with close OB follow-up this week. Unclear if this represents threatened miscarriage or not. Patient is Rh+. Agrees with treatment for yeast infection as well as bacterial vaginosis. She is not a rhogam candidate.  Start prenatal vitamins, follow-up with OB this week. Return precautions discussed.  I personally performed the services described in this documentation, which was scribed in my presence. The recorded information has been reviewed and is accurate.    Glynn OctaveStephen Hilary Milks, MD 02/17/15 2242

## 2015-02-17 NOTE — ED Notes (Signed)
Pt reports she was weaned off depo in June and has had irregular periods since, this month pt has had increased discharge and migraine, has appointment with ob doctor next week

## 2015-02-17 NOTE — Discharge Instructions (Signed)
Bacterial Vaginosis Follow up with Dr. Shawnie PonsPratt this week. Return to the ED if you develop new or worsening symptoms. Bacterial vaginosis is a vaginal infection that occurs when the normal balance of bacteria in the vagina is disrupted. It results from an overgrowth of certain bacteria. This is the most common vaginal infection in women of childbearing age. Treatment is important to prevent complications, especially in pregnant women, as it can cause a premature delivery. CAUSES  Bacterial vaginosis is caused by an increase in harmful bacteria that are normally present in smaller amounts in the vagina. Several different kinds of bacteria can cause bacterial vaginosis. However, the reason that the condition develops is not fully understood. RISK FACTORS Certain activities or behaviors can put you at an increased risk of developing bacterial vaginosis, including:  Having a new sex partner or multiple sex partners.  Douching.  Using an intrauterine device (IUD) for contraception. Women do not get bacterial vaginosis from toilet seats, bedding, swimming pools, or contact with objects around them. SIGNS AND SYMPTOMS  Some women with bacterial vaginosis have no signs or symptoms. Common symptoms include:  Grey vaginal discharge.  A fishlike odor with discharge, especially after sexual intercourse.  Itching or burning of the vagina and vulva.  Burning or pain with urination. DIAGNOSIS  Your health care provider will take a medical history and examine the vagina for signs of bacterial vaginosis. A sample of vaginal fluid may be taken. Your health care provider will look at this sample under a microscope to check for bacteria and abnormal cells. A vaginal pH test may also be done.  TREATMENT  Bacterial vaginosis may be treated with antibiotic medicines. These may be given in the form of a pill or a vaginal cream. A second round of antibiotics may be prescribed if the condition comes back after  treatment. Because bacterial vaginosis increases your risk for sexually transmitted diseases, getting treated can help reduce your risk for chlamydia, gonorrhea, HIV, and herpes. HOME CARE INSTRUCTIONS   Only take over-the-counter or prescription medicines as directed by your health care provider.  If antibiotic medicine was prescribed, take it as directed. Make sure you finish it even if you start to feel better.  Tell all sexual partners that you have a vaginal infection. They should see their health care provider and be treated if they have problems, such as a mild rash or itching.  During treatment, it is important that you follow these instructions:  Avoid sexual activity or use condoms correctly.  Do not douche.  Avoid alcohol as directed by your health care provider.  Avoid breastfeeding as directed by your health care provider. SEEK MEDICAL CARE IF:   Your symptoms are not improving after 3 days of treatment.  You have increased discharge or pain.  You have a fever. MAKE SURE YOU:   Understand these instructions.  Will watch your condition.  Will get help right away if you are not doing well or get worse. FOR MORE INFORMATION  Centers for Disease Control and Prevention, Division of STD Prevention: SolutionApps.co.zawww.cdc.gov/std American Sexual Health Association (ASHA): www.ashastd.org    This information is not intended to replace advice given to you by your health care provider. Make sure you discuss any questions you have with your health care provider.   Document Released: 01/20/2005 Document Revised: 02/10/2014 Document Reviewed: 09/01/2012 Elsevier Interactive Patient Education [employer]2016 Elsevier Inc.

## 2015-02-19 LAB — GC/CHLAMYDIA PROBE AMP (~~LOC~~) NOT AT ARMC
Chlamydia: NEGATIVE
NEISSERIA GONORRHEA: NEGATIVE

## 2015-02-21 ENCOUNTER — Encounter: Payer: Self-pay | Admitting: Family Medicine

## 2015-02-21 ENCOUNTER — Ambulatory Visit (INDEPENDENT_AMBULATORY_CARE_PROVIDER_SITE_OTHER): Payer: Medicaid Other | Admitting: Family Medicine

## 2015-02-21 VITALS — BP 120/72 | HR 92 | Temp 98.2°F | Wt 194.1 lb

## 2015-02-21 DIAGNOSIS — Z720 Tobacco use: Secondary | ICD-10-CM

## 2015-02-21 MED ORDER — NICOTINE 7 MG/24HR TD PT24: 7.0000 mg | MEDICATED_PATCH | Freq: Every day | TRANSDERMAL | Status: DC

## 2015-02-21 NOTE — Progress Notes (Signed)
  Patient name: Alicia Pearson MRN [id number]  Date of birth: 31-Oct-1985  CC & HPI:  Alicia Pearson is a 30 y.o. female presenting today for smoking cessation. Report being pregnant and wanting to quit smoking. Currently smoking 7 cigs a day. Taking prenatal vitamins.   ROS: See HPI   Medications & Allergies: Reviewed  Social History: Reviewed:   Objective Findings:  Vitals: BP 120/72 mmHg  Pulse 92  Temp(Src) 98.2 F (36.8 C) (Oral)  Wt 194 lb 1.6 oz (88.043 kg)  LMP 02/27/2014 (Approximate)  Breastfeeding? No  Gen: NAD CV: RRR w/o m/r/g, pulses +2 b/l Resp: CTAB w/ normal respiratory effort  Assessment & Plan:   Tobacco abuse Wants to quit 2/2 current pregnancy -  patches prescribed - f/u with Dr Raymondo Band  - She has prenatal appointment scheduled for tomorrow

## 2015-02-21 NOTE — Assessment & Plan Note (Signed)
Wants to quit 2/2 current pregnancy -  patches prescribed - f/u with Dr Raymondo Band  - She has prenatal appointment scheduled for tomorrow

## 2015-02-21 NOTE — Patient Instructions (Signed)
It was great seeing you today.  Congratulations on your pregnancy.  Keep your follow-up appointment for your prenatal visit.   1.  Start nicotine patches 7 mg daily.  Follow-up with Dr. Raymondo Band as scheduled.  Let us know if the nicotine patches or not covered by your insurance.    If you have any questions or concerns before then, please call the clinic at [phone].  Take Care,   Dr Wenda Low

## 2015-02-22 ENCOUNTER — Ambulatory Visit (INDEPENDENT_AMBULATORY_CARE_PROVIDER_SITE_OTHER): Payer: Medicaid Other | Admitting: Obstetrics & Gynecology

## 2015-02-22 ENCOUNTER — Encounter: Payer: Self-pay | Admitting: Obstetrics & Gynecology

## 2015-02-22 ENCOUNTER — Other Ambulatory Visit (HOSPITAL_COMMUNITY)
Admission: RE | Admit: 2015-02-22 | Discharge: 2015-02-22 | Disposition: A | Discharge status: Home / Self Care | Payer: Medicaid Other | Source: Ambulatory Visit | Attending: Obstetrics & Gynecology | Admitting: Obstetrics & Gynecology

## 2015-02-22 VITALS — BP 113/74 | HR 94 | Wt 198.0 lb

## 2015-02-22 DIAGNOSIS — Z01411 Encounter for gynecological examination (general) (routine) with abnormal findings: Secondary | ICD-10-CM | POA: Diagnosis present

## 2015-02-22 DIAGNOSIS — Z36 Encounter for antenatal screening of mother: Secondary | ICD-10-CM

## 2015-02-22 DIAGNOSIS — O99210 Obesity complicating pregnancy, unspecified trimester: Secondary | ICD-10-CM | POA: Insufficient documentation

## 2015-02-22 DIAGNOSIS — Z3490 Encounter for supervision of normal pregnancy, unspecified, unspecified trimester: Secondary | ICD-10-CM

## 2015-02-22 DIAGNOSIS — O0990 Supervision of high risk pregnancy, unspecified, unspecified trimester: Secondary | ICD-10-CM | POA: Insufficient documentation

## 2015-02-22 DIAGNOSIS — O30009 Twin pregnancy, unspecified number of placenta and unspecified number of amniotic sacs, unspecified trimester: Secondary | ICD-10-CM

## 2015-02-22 DIAGNOSIS — O30003 Twin pregnancy, unspecified number of placenta and unspecified number of amniotic sacs, third trimester: Secondary | ICD-10-CM | POA: Diagnosis not present

## 2015-02-22 DIAGNOSIS — B009 Herpesviral infection, unspecified: Secondary | ICD-10-CM | POA: Insufficient documentation

## 2015-02-22 DIAGNOSIS — Z641 Problems related to multiparity: Secondary | ICD-10-CM

## 2015-02-22 DIAGNOSIS — Z1151 Encounter for screening for human papillomavirus (HPV): Secondary | ICD-10-CM | POA: Insufficient documentation

## 2015-02-22 DIAGNOSIS — E669 Obesity, unspecified: Secondary | ICD-10-CM

## 2015-02-22 DIAGNOSIS — O09891 Supervision of other high risk pregnancies, first trimester: Secondary | ICD-10-CM

## 2015-02-22 DIAGNOSIS — O98519 Other viral diseases complicating pregnancy, unspecified trimester: Principal | ICD-10-CM

## 2015-02-22 DIAGNOSIS — O09899 Supervision of other high risk pregnancies, unspecified trimester: Secondary | ICD-10-CM

## 2015-02-22 DIAGNOSIS — O98511 Other viral diseases complicating pregnancy, first trimester: Secondary | ICD-10-CM

## 2015-02-22 DIAGNOSIS — Z113 Encounter for screening for infections with a predominantly sexual mode of transmission: Secondary | ICD-10-CM | POA: Insufficient documentation
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
## 2015-02-22 NOTE — Progress Notes (Signed)
Bedside ultrasound today shows twins, one baby [redacted]w[redacted]d and the other [redacted]w[redacted]d.

## 2015-02-23 LAB — PRENATAL PROFILE (SOLSTAS)
Antibody Screen: POSITIVE — AB
Basophils Absolute: 0 10*3/uL (ref 0.0–0.1)
Basophils Relative: 0 % (ref 0–1)
Eosinophils Absolute: 0 10*3/uL (ref 0.0–0.7)
Eosinophils Relative: 0 % (ref 0–5)
HCT: 40.3 % (ref 36.0–46.0)
HEP B S AG: NEGATIVE
HIV: NONREACTIVE
Hemoglobin: 13.7 g/dL (ref 12.0–15.0)
Lymphocytes Relative: 19 % (ref 12–46)
Lymphs Abs: 2 10*3/uL (ref 0.7–4.0)
MCH: 31.9 pg (ref 26.0–34.0)
MCHC: 34 g/dL (ref 30.0–36.0)
MCV: 93.9 fL (ref 78.0–100.0)
MPV: 8.5 fL — ABNORMAL LOW (ref 8.6–12.4)
Monocytes Absolute: 0.7 10*3/uL (ref 0.1–1.0)
Monocytes Relative: 7 % (ref 3–12)
NEUTROS ABS: 7.8 10*3/uL — AB (ref 1.7–7.7)
Neutrophils Relative %: 74 % (ref 43–77)
Platelets: 303 10*3/uL (ref 150–400)
RBC: 4.29 MIL/uL (ref 3.87–5.11)
RDW: 13.8 % (ref 11.5–15.5)
Rh Type: POSITIVE
Rubella: 3.29 Index — ABNORMAL HIGH (ref <0.90)
WBC: 10.5 10*3/uL (ref 4.0–10.5)

## 2015-02-23 LAB — PRENATAL ANTIBODY IDENTIFICATION

## 2015-02-23 LAB — SICKLE CELL SCREEN: Sickle Cell Screen: NEGATIVE

## 2015-02-23 LAB — CULTURE, OB URINE
Colony Count: NO GROWTH
Organism ID, Bacteria: NO GROWTH

## 2015-02-26 LAB — CYTOLOGY - PAP

## 2015-02-28 ENCOUNTER — Encounter: Payer: Self-pay | Admitting: Obstetrics & Gynecology

## 2015-02-28 DIAGNOSIS — IMO0002 Reserved for concepts with insufficient information to code with codable children: Secondary | ICD-10-CM | POA: Insufficient documentation

## 2015-03-01 ENCOUNTER — Telehealth: Payer: Self-pay | Admitting: *Deleted

## 2015-03-01 ENCOUNTER — Encounter: Payer: Self-pay | Admitting: *Deleted

## 2015-03-01 NOTE — Telephone Encounter (Signed)
Called pt, no answer, left message to call office.

## 2015-03-01 NOTE — Telephone Encounter (Signed)
-----   Message from Allie Bossier, MD sent at 02/28/2015  4:14 PM EST ----- She has ASCUS + HR HPV and will need a colpo. Thanks

## 2015-03-12 ENCOUNTER — Encounter: Payer: Medicaid Other | Admitting: Obstetrics & Gynecology

## 2015-03-16 ENCOUNTER — Ambulatory Visit (INDEPENDENT_AMBULATORY_CARE_PROVIDER_SITE_OTHER): Payer: Medicaid Other | Admitting: Obstetrics & Gynecology

## 2015-03-16 ENCOUNTER — Encounter: Payer: Self-pay | Admitting: Obstetrics & Gynecology

## 2015-03-16 VITALS — BP 117/77 | HR 102 | Wt 196.0 lb

## 2015-03-16 DIAGNOSIS — IMO0002 Reserved for concepts with insufficient information to code with codable children: Secondary | ICD-10-CM

## 2015-03-16 DIAGNOSIS — R87810 Cervical high risk human papillomavirus (HPV) DNA test positive: Secondary | ICD-10-CM | POA: Diagnosis not present

## 2015-03-16 DIAGNOSIS — R87610 Atypical squamous cells of undetermined significance on cytologic smear of cervix (ASC-US): Secondary | ICD-10-CM | POA: Diagnosis not present

## 2015-03-16 NOTE — Progress Notes (Signed)
   Subjective:    Patient ID: Alicia Pearson, female    DOB: 1985/05/29, 30 y.o.   MRN: [id number]  HPI This S AA P6 (currently pregnant with twins) is here for a colpo due to ASCUS +HR HPV.  Her next OB visit is later this month. She has no OB problems today.   Review of Systems     Objective:   Physical Exam She and FOB argued prior to the procedure and she asked for him to leave the exam room. UPT negative, consent signed, time out done Cervix prepped with acetic acid. Transformation zone seen in its entirety. Colpo adequate. Colpo entirely normal She tolerated the procedure well.     Assessment & Plan:  ASCUS +HR HPV- with negative colpo Rec pap with cotesting post partum

## 2015-03-22 ENCOUNTER — Encounter: Payer: Self-pay | Admitting: *Deleted

## 2015-03-23 ENCOUNTER — Encounter (HOSPITAL_COMMUNITY): Payer: Self-pay | Admitting: Obstetrics & Gynecology

## 2015-03-30 ENCOUNTER — Ambulatory Visit (HOSPITAL_COMMUNITY): Admission: RE | Admit: 2015-03-30 | Payer: Medicaid Other | Source: Ambulatory Visit

## 2015-03-30 ENCOUNTER — Encounter (HOSPITAL_COMMUNITY): Payer: Self-pay

## 2015-03-30 ENCOUNTER — Ambulatory Visit (HOSPITAL_COMMUNITY)
Admission: RE | Admit: 2015-03-30 | Discharge: 2015-03-30 | Disposition: A | Discharge status: Home / Self Care | Payer: Medicaid Other | Source: Ambulatory Visit | Attending: Obstetrics & Gynecology | Admitting: Obstetrics & Gynecology

## 2015-03-30 ENCOUNTER — Other Ambulatory Visit: Payer: Self-pay | Admitting: Obstetrics & Gynecology

## 2015-03-30 ENCOUNTER — Encounter: Payer: Medicaid Other | Admitting: Obstetrics & Gynecology

## 2015-03-30 VITALS — BP 132/66 | HR 100 | Wt 192.0 lb

## 2015-03-30 DIAGNOSIS — O98311 Other infections with a predominantly sexual mode of transmission complicating pregnancy, first trimester: Secondary | ICD-10-CM

## 2015-03-30 DIAGNOSIS — O99331 Smoking (tobacco) complicating pregnancy, first trimester: Secondary | ICD-10-CM

## 2015-03-30 DIAGNOSIS — A6009 Herpesviral infection of other urogenital tract: Secondary | ICD-10-CM

## 2015-03-30 DIAGNOSIS — O30009 Twin pregnancy, unspecified number of placenta and unspecified number of amniotic sacs, unspecified trimester: Secondary | ICD-10-CM

## 2015-03-30 DIAGNOSIS — O0941 Supervision of pregnancy with grand multiparity, first trimester: Secondary | ICD-10-CM

## 2015-03-30 DIAGNOSIS — O09212 Supervision of pregnancy with history of pre-term labor, second trimester: Secondary | ICD-10-CM | POA: Insufficient documentation

## 2015-03-30 DIAGNOSIS — Z3A13 13 weeks gestation of pregnancy: Secondary | ICD-10-CM

## 2015-03-30 DIAGNOSIS — O99332 Smoking (tobacco) complicating pregnancy, second trimester: Secondary | ICD-10-CM | POA: Insufficient documentation

## 2015-03-30 DIAGNOSIS — O98519 Other viral diseases complicating pregnancy, unspecified trimester: Secondary | ICD-10-CM

## 2015-03-30 DIAGNOSIS — O09899 Supervision of other high risk pregnancies, unspecified trimester: Secondary | ICD-10-CM

## 2015-03-30 DIAGNOSIS — O30041 Twin pregnancy, dichorionic/diamniotic, first trimester: Secondary | ICD-10-CM | POA: Diagnosis not present

## 2015-03-30 DIAGNOSIS — Z8279 Family history of other congenital malformations, deformations and chromosomal abnormalities: Secondary | ICD-10-CM

## 2015-03-30 DIAGNOSIS — Z3A14 14 weeks gestation of pregnancy: Secondary | ICD-10-CM | POA: Diagnosis not present

## 2015-03-30 DIAGNOSIS — Z36 Encounter for antenatal screening of mother: Secondary | ICD-10-CM | POA: Diagnosis present

## 2015-03-30 DIAGNOSIS — O09211 Supervision of pregnancy with history of pre-term labor, first trimester: Secondary | ICD-10-CM

## 2015-03-30 DIAGNOSIS — B009 Herpesviral infection, unspecified: Secondary | ICD-10-CM

## 2015-03-30 DIAGNOSIS — O09891 Supervision of other high risk pregnancies, first trimester: Secondary | ICD-10-CM

## 2015-03-30 DIAGNOSIS — Z3689 Encounter for other specified antenatal screening: Secondary | ICD-10-CM

## 2015-03-30 DIAGNOSIS — Z641 Problems related to multiparity: Secondary | ICD-10-CM

## 2015-03-30 DIAGNOSIS — IMO0002 Reserved for concepts with insufficient information to code with codable children: Secondary | ICD-10-CM

## 2015-03-30 DIAGNOSIS — O99210 Obesity complicating pregnancy, unspecified trimester: Secondary | ICD-10-CM

## 2015-04-13 ENCOUNTER — Encounter: Payer: Medicaid Other | Admitting: Obstetrics & Gynecology

## 2015-05-03 ENCOUNTER — Other Ambulatory Visit: Payer: Self-pay | Admitting: *Deleted

## 2015-05-03 DIAGNOSIS — Z3492 Encounter for supervision of normal pregnancy, unspecified, second trimester: Secondary | ICD-10-CM

## 2015-05-03 MED ORDER — PRENATAL COMPLETE 14-0.4 MG PO TABS: 1.0000 | ORAL_TABLET | Freq: Every day | ORAL | Status: DC

## 2015-05-03 NOTE — Telephone Encounter (Signed)
Pt needs refill on prenatal vitamins. Sent to [facility] per pt req

## 2015-05-04 ENCOUNTER — Ambulatory Visit (HOSPITAL_COMMUNITY): Payer: Medicaid Other

## 2015-05-04 ENCOUNTER — Other Ambulatory Visit (HOSPITAL_COMMUNITY): Payer: Self-pay | Admitting: Maternal and Fetal Medicine

## 2015-05-04 ENCOUNTER — Encounter (HOSPITAL_COMMUNITY): Payer: Self-pay

## 2015-05-04 ENCOUNTER — Ambulatory Visit (HOSPITAL_COMMUNITY)
Admission: RE | Admit: 2015-05-04 | Discharge: 2015-05-04 | Disposition: A | Discharge status: Home / Self Care | Payer: Medicaid Other | Source: Ambulatory Visit | Attending: Maternal and Fetal Medicine | Admitting: Maternal and Fetal Medicine

## 2015-05-04 VITALS — BP 106/61 | HR 91 | Wt 202.2 lb

## 2015-05-04 DIAGNOSIS — O09292 Supervision of pregnancy with other poor reproductive or obstetric history, second trimester: Secondary | ICD-10-CM | POA: Diagnosis not present

## 2015-05-04 DIAGNOSIS — O98512 Other viral diseases complicating pregnancy, second trimester: Secondary | ICD-10-CM

## 2015-05-04 DIAGNOSIS — O09212 Supervision of pregnancy with history of pre-term labor, second trimester: Secondary | ICD-10-CM | POA: Insufficient documentation

## 2015-05-04 DIAGNOSIS — O99212 Obesity complicating pregnancy, second trimester: Secondary | ICD-10-CM

## 2015-05-04 DIAGNOSIS — O99332 Smoking (tobacco) complicating pregnancy, second trimester: Secondary | ICD-10-CM

## 2015-05-04 DIAGNOSIS — Z3A19 19 weeks gestation of pregnancy: Secondary | ICD-10-CM | POA: Insufficient documentation

## 2015-05-04 DIAGNOSIS — Z641 Problems related to multiparity: Secondary | ICD-10-CM

## 2015-05-04 DIAGNOSIS — O98519 Other viral diseases complicating pregnancy, unspecified trimester: Secondary | ICD-10-CM

## 2015-05-04 DIAGNOSIS — O30042 Twin pregnancy, dichorionic/diamniotic, second trimester: Secondary | ICD-10-CM

## 2015-05-04 DIAGNOSIS — O0942 Supervision of pregnancy with grand multiparity, second trimester: Secondary | ICD-10-CM

## 2015-05-04 DIAGNOSIS — O30041 Twin pregnancy, dichorionic/diamniotic, first trimester: Secondary | ICD-10-CM

## 2015-05-04 DIAGNOSIS — IMO0002 Reserved for concepts with insufficient information to code with codable children: Secondary | ICD-10-CM

## 2015-05-04 DIAGNOSIS — Z3689 Encounter for other specified antenatal screening: Secondary | ICD-10-CM

## 2015-05-04 DIAGNOSIS — Z36 Encounter for antenatal screening of mother: Secondary | ICD-10-CM | POA: Diagnosis not present

## 2015-05-04 DIAGNOSIS — B009 Herpesviral infection, unspecified: Secondary | ICD-10-CM

## 2015-05-04 DIAGNOSIS — O99210 Obesity complicating pregnancy, unspecified trimester: Secondary | ICD-10-CM

## 2015-05-04 DIAGNOSIS — O09892 Supervision of other high risk pregnancies, second trimester: Secondary | ICD-10-CM

## 2015-05-11 ENCOUNTER — Encounter: Payer: Self-pay | Admitting: Obstetrics & Gynecology

## 2015-05-11 ENCOUNTER — Ambulatory Visit (INDEPENDENT_AMBULATORY_CARE_PROVIDER_SITE_OTHER): Payer: Medicaid Other | Admitting: Obstetrics & Gynecology

## 2015-05-11 VITALS — BP 107/71 | HR 99 | Wt 203.0 lb

## 2015-05-11 DIAGNOSIS — E669 Obesity, unspecified: Secondary | ICD-10-CM

## 2015-05-11 DIAGNOSIS — O99212 Obesity complicating pregnancy, second trimester: Secondary | ICD-10-CM

## 2015-05-11 DIAGNOSIS — O0992 Supervision of high risk pregnancy, unspecified, second trimester: Secondary | ICD-10-CM

## 2015-05-11 DIAGNOSIS — Z36 Encounter for antenatal screening of mother: Secondary | ICD-10-CM

## 2015-05-11 DIAGNOSIS — O30049 Twin pregnancy, dichorionic/diamniotic, unspecified trimester: Secondary | ICD-10-CM | POA: Diagnosis not present

## 2015-05-11 NOTE — Patient Instructions (Signed)
Return to clinic for any scheduled appointments or obstetric concerns, or go to [facility] for evaluation  
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
## 2015-05-12 LAB — GLUCOSE TOLERANCE, 1 HOUR (50G) W/O FASTING: GLUCOSE, 1 HR, GESTATIONAL: 60 mg/dL — AB (ref <140)

## 2015-06-06 ENCOUNTER — Telehealth: Payer: Self-pay | Admitting: *Deleted

## 2015-06-07 NOTE — Telephone Encounter (Signed)
Pt called and stated she thought she was having some mild contractions. Explained to pt that dehydration can cause contractions, to be sure she was drinking enough fluids. Advised pt to drink 3 big cups of water and rest to see if contractions went away. If contractions persist, starts leaking fluid, bleeding, etc to report to [facility] for eval. Pt expressed understanding.

## 2015-06-08 ENCOUNTER — Ambulatory Visit (INDEPENDENT_AMBULATORY_CARE_PROVIDER_SITE_OTHER): Payer: Medicaid Other | Admitting: Family Medicine

## 2015-06-08 VITALS — BP 98/65 | HR 94 | Wt 208.0 lb

## 2015-06-08 DIAGNOSIS — O30049 Twin pregnancy, dichorionic/diamniotic, unspecified trimester: Secondary | ICD-10-CM | POA: Diagnosis not present

## 2015-06-08 DIAGNOSIS — O0992 Supervision of high risk pregnancy, unspecified, second trimester: Secondary | ICD-10-CM

## 2015-06-08 NOTE — Patient Instructions (Signed)
Second Trimester of Pregnancy The second trimester is from week 13 through week 28, months 4 through 6. The second trimester is often a time when you feel your best. Your body has also adjusted to being pregnant, and you begin to feel better physically. Usually, morning sickness has lessened or quit completely, you may have more energy, and you may have an increase in appetite. The second trimester is also a time when the fetus is growing rapidly. At the end of the sixth month, the fetus is about 9 inches long and weighs about 1 pounds. You will likely begin to feel the baby move (quickening) between 18 and 20 weeks of the pregnancy. BODY CHANGES Your body goes through many changes during pregnancy. The changes vary from woman to woman.   Your weight will continue to increase. You will notice your lower abdomen bulging out.  You may begin to get stretch marks on your hips, abdomen, and breasts.  You may develop headaches that can be relieved by medicines approved by your health care provider.  You may urinate more often because the fetus is pressing on your bladder.  You may develop or continue to have heartburn as a result of your pregnancy.  You may develop constipation because certain hormones are causing the muscles that push waste through your intestines to slow down.  You may develop hemorrhoids or swollen, bulging veins (varicose veins).  You may have back pain because of the weight gain and pregnancy hormones relaxing your joints between the bones in your pelvis and as a result of a shift in weight and the muscles that support your balance.  Your breasts will continue to grow and be tender.  Your gums may bleed and may be sensitive to brushing and flossing.  Dark spots or blotches (chloasma, mask of pregnancy) may develop on your face. This will likely fade after the baby is born.  A dark line from your belly button to the pubic area (linea nigra) may appear. This will likely  fade after the baby is born.  You may have changes in your hair. These can include thickening of your hair, rapid growth, and changes in texture. Some women also have hair loss during or after pregnancy, or hair that feels dry or thin. Your hair will most likely return to normal after your baby is born. WHAT TO EXPECT AT YOUR PRENATAL VISITS During a routine prenatal visit:  You will be weighed to make sure you and the fetus are growing normally.  Your blood pressure will be taken.  Your abdomen will be measured to track your baby's growth.  The fetal heartbeat will be listened to.  Any test results from the previous visit will be discussed. Your health care provider may ask you:  How you are feeling.  If you are feeling the baby move.  If you have had any abnormal symptoms, such as leaking fluid, bleeding, severe headaches, or abdominal cramping.  If you are using any tobacco products, including cigarettes, chewing tobacco, and electronic cigarettes.  If you have any questions. Other tests that may be performed during your second trimester include:  Blood tests that check for:  Low iron levels (anemia).  Gestational diabetes (between 24 and 28 weeks).  Rh antibodies.  Urine tests to check for infections, diabetes, or protein in the urine.  An ultrasound to confirm the proper growth and development of the baby.  An amniocentesis to check for possible genetic problems.  Fetal screens for spina bifida   and Down syndrome.  HIV (human immunodeficiency virus) testing. Routine prenatal testing includes screening for HIV, unless you choose not to have this test. HOME CARE INSTRUCTIONS   Avoid all smoking, herbs, alcohol, and unprescribed drugs. These chemicals affect the formation and growth of the baby.  Do not use any tobacco products, including cigarettes, chewing tobacco, and electronic cigarettes. If you need help quitting, ask your health care provider. You may receive  counseling support and other resources to help you quit.  Follow your health care provider's instructions regarding medicine use. There are medicines that are either safe or unsafe to take during pregnancy.  Exercise only as directed by your health care provider. Experiencing uterine cramps is a good sign to stop exercising.  Continue to eat regular, healthy meals.  Wear a good support bra for breast tenderness.  Do not use hot tubs, steam rooms, or saunas.  Wear your seat belt at all times when driving.  Avoid raw meat, uncooked cheese, cat litter boxes, and soil used by cats. These carry germs that can cause birth defects in the baby.  Take your prenatal vitamins.  Take 1500-2000 mg of calcium daily starting at the 20th week of pregnancy until you deliver your baby.  Try taking a stool softener (if your health care provider approves) if you develop constipation. Eat more high-fiber foods, such as fresh vegetables or fruit and whole grains. Drink plenty of fluids to keep your urine clear or pale yellow.  Take warm sitz baths to soothe any pain or discomfort caused by hemorrhoids. Use hemorrhoid cream if your health care provider approves.  If you develop varicose veins, wear support hose. Elevate your feet for 15 minutes, 3-4 times a day. Limit salt in your diet.  Avoid heavy lifting, wear low heel shoes, and practice good posture.  Rest with your legs elevated if you have leg cramps or low back pain.  Visit your dentist if you have not gone yet during your pregnancy. Use a soft toothbrush to brush your teeth and be gentle when you floss.  A sexual relationship may be continued unless your health care provider directs you otherwise.  Continue to go to all your prenatal visits as directed by your health care provider. SEEK MEDICAL CARE IF:   You have dizziness.  You have mild pelvic cramps, pelvic pressure, or nagging pain in the abdominal area.  You have persistent nausea,  vomiting, or diarrhea.  You have a bad smelling vaginal discharge.  You have pain with urination. SEEK IMMEDIATE MEDICAL CARE IF:   You have a fever.  You are leaking fluid from your vagina.  You have spotting or bleeding from your vagina.  You have severe abdominal cramping or pain.  You have rapid weight gain or loss.  You have shortness of breath with chest pain.  You notice sudden or extreme swelling of your face, hands, ankles, feet, or legs.  You have not felt your baby move in over an hour.  You have severe headaches that do not go away with medicine.  You have vision changes.   This information is not intended to replace advice given to you by your health care provider. Make sure you discuss any questions you have with your health care provider.   Document Released: 01/14/2001 Document Revised: 02/10/2014 Document Reviewed: 03/23/2012 Elsevier Interactive Patient Education 2016 [employer]   Breastfeeding Deciding to breastfeed is one of the best choices you can make for you and your baby. A change   in hormones during pregnancy causes your breast tissue to grow and increases the number and size of your milk ducts. These hormones also allow proteins, sugars, and fats from your blood supply to make breast milk in your milk-producing glands. Hormones prevent breast milk from being released before your baby is born as well as prompt milk flow after birth. Once breastfeeding has begun, thoughts of your baby, as well as his or her sucking or crying, can stimulate the release of milk from your milk-producing glands.  BENEFITS OF BREASTFEEDING For Your Baby  Your first milk (colostrum) helps your baby's digestive system function better.  There are antibodies in your milk that help your baby fight off infections.  Your baby has a lower incidence of asthma, allergies, and sudden infant death syndrome.  The nutrients in breast milk are better for your baby than infant  formulas and are designed uniquely for your baby's needs.  Breast milk improves your baby's brain development.  Your baby is less likely to develop other conditions, such as childhood obesity, asthma, or type 2 diabetes mellitus. For You  Breastfeeding helps to create a very special bond between you and your baby.  Breastfeeding is convenient. Breast milk is always available at the correct temperature and costs nothing.  Breastfeeding helps to burn calories and helps you lose the weight gained during pregnancy.  Breastfeeding makes your uterus contract to its prepregnancy size faster and slows bleeding (lochia) after you give birth.   Breastfeeding helps to lower your risk of developing type 2 diabetes mellitus, osteoporosis, and breast or ovarian cancer later in life. SIGNS THAT YOUR BABY IS HUNGRY Early Signs of Hunger  Increased alertness or activity.  Stretching.  Movement of the head from side to side.  Movement of the head and opening of the mouth when the corner of the mouth or cheek is stroked (rooting).  Increased sucking sounds, smacking lips, cooing, sighing, or squeaking.  Hand-to-mouth movements.  Increased sucking of fingers or hands. Late Signs of Hunger  Fussing.  Intermittent crying. Extreme Signs of Hunger Signs of extreme hunger will require calming and consoling before your baby will be able to breastfeed successfully. Do not wait for the following signs of extreme hunger to occur before you initiate breastfeeding:  Restlessness.  A loud, strong cry.  Screaming. BREASTFEEDING BASICS Breastfeeding Initiation  Find a comfortable place to sit or lie down, with your neck and back well supported.  Place a pillow or rolled up blanket under your baby to bring him or her to the level of your breast (if you are seated). Nursing pillows are specially designed to help support your arms and your baby while you breastfeed.  Make sure that your baby's  abdomen is facing your abdomen.  Gently massage your breast. With your fingertips, massage from your chest wall toward your nipple in a circular motion. This encourages milk flow. You may need to continue this action during the feeding if your milk flows slowly.  Support your breast with 4 fingers underneath and your thumb above your nipple. Make sure your fingers are well away from your nipple and your baby's mouth.  Stroke your baby's lips gently with your finger or nipple.  When your baby's mouth is open wide enough, quickly bring your baby to your breast, placing your entire nipple and as much of the colored area around your nipple (areola) as possible into your baby's mouth.  More areola should be visible above your baby's upper lip than   below the lower lip.  Your baby's tongue should be between his or her lower gum and your breast.  Ensure that your baby's mouth is correctly positioned around your nipple (latched). Your baby's lips should create a seal on your breast and be turned out (everted).  It is common for your baby to suck about 2-3 minutes in order to start the flow of breast milk. Latching Teaching your baby how to latch on to your breast properly is very important. An improper latch can cause nipple pain and decreased milk supply for you and poor weight gain in your baby. Also, if your baby is not latched onto your nipple properly, he or she may swallow some air during feeding. This can make your baby fussy. Burping your baby when you switch breasts during the feeding can help to get rid of the air. However, teaching your baby to latch on properly is still the best way to prevent fussiness from swallowing air while breastfeeding. Signs that your baby has successfully latched on to your nipple:  Silent tugging or silent sucking, without causing you pain.  Swallowing heard between every 3-4 sucks.  Muscle movement above and in front of his or her ears while sucking. Signs  that your baby has not successfully latched on to nipple:  Sucking sounds or smacking sounds from your baby while breastfeeding.  Nipple pain. If you think your baby has not latched on correctly, slip your finger into the corner of your baby's mouth to break the suction and place it between your baby's gums. Attempt breastfeeding initiation again. Signs of Successful Breastfeeding Signs from your baby:  A gradual decrease in the number of sucks or complete cessation of sucking.  Falling asleep.  Relaxation of his or her body.  Retention of a small amount of milk in his or her mouth.  Letting go of your breast by himself or herself. Signs from you:  Breasts that have increased in firmness, weight, and size 1-3 hours after feeding.  Breasts that are softer immediately after breastfeeding.  Increased milk volume, as well as a change in milk consistency and color by the fifth day of breastfeeding.  Nipples that are not sore, cracked, or bleeding. Signs That Your Baby is Getting Enough Milk  Wetting at least 3 diapers in a 24-hour period. The urine should be clear and pale yellow by age 5 days.  At least 3 stools in a 24-hour period by age 5 days. The stool should be soft and yellow.  At least 3 stools in a 24-hour period by age 7 days. The stool should be seedy and yellow.  No loss of weight greater than 10% of birth weight during the first 3 days of age.  Average weight gain of 4-7 ounces (113-198 g) per week after age 4 days.  Consistent daily weight gain by age 5 days, without weight loss after the age of 2 weeks. After a feeding, your baby may spit up a small amount. This is common. BREASTFEEDING FREQUENCY AND DURATION Frequent feeding will help you make more milk and can prevent sore nipples and breast engorgement. Breastfeed when you feel the need to reduce the fullness of your breasts or when your baby shows signs of hunger. This is called "breastfeeding on demand." Avoid  introducing a pacifier to your baby while you are working to establish breastfeeding (the first 4-6 weeks after your baby is born). After this time you may choose to use a pacifier. Research has shown that   pacifier use during the first year of a baby's life decreases the risk of sudden infant death syndrome (SIDS). Allow your baby to feed on each breast as long as he or she wants. Breastfeed until your baby is finished feeding. When your baby unlatches or falls asleep while feeding from the first breast, offer the second breast. Because newborns are often sleepy in the first few weeks of life, you may need to awaken your baby to get him or her to feed. Breastfeeding times will vary from baby to baby. However, the following rules can serve as a guide to help you ensure that your baby is properly fed:  Newborns (babies 4 weeks of age or younger) may breastfeed every 1-3 hours.  Newborns should not go longer than 3 hours during the day or 5 hours during the night without breastfeeding.  You should breastfeed your baby a minimum of 8 times in a 24-hour period until you begin to introduce solid foods to your baby at around 6 months of age. BREAST MILK PUMPING Pumping and storing breast milk allows you to ensure that your baby is exclusively fed your breast milk, even at times when you are unable to breastfeed. This is especially important if you are going back to work while you are still breastfeeding or when you are not able to be present during feedings. Your lactation consultant can give you guidelines on how long it is safe to store breast milk. A breast pump is a machine that allows you to pump milk from your breast into a sterile bottle. The pumped breast milk can then be stored in a refrigerator or freezer. Some breast pumps are operated by hand, while others use electricity. Ask your lactation consultant which type will work best for you. Breast pumps can be purchased, but some hospitals and  breastfeeding support groups lease breast pumps on a monthly basis. A lactation consultant can teach you how to hand express breast milk, if you prefer not to use a pump. CARING FOR YOUR BREASTS WHILE YOU BREASTFEED Nipples can become dry, cracked, and sore while breastfeeding. The following recommendations can help keep your breasts moisturized and healthy:  Avoid using soap on your nipples.  Wear a supportive bra. Although not required, special nursing bras and tank tops are designed to allow access to your breasts for breastfeeding without taking off your entire bra or top. Avoid wearing underwire-style bras or extremely tight bras.  Air dry your nipples for 3-4minutes after each feeding.  Use only cotton bra pads to absorb leaked breast milk. Leaking of breast milk between feedings is normal.  Use lanolin on your nipples after breastfeeding. Lanolin helps to maintain your skin's normal moisture barrier. If you use pure lanolin, you do not need to wash it off before feeding your baby again. Pure lanolin is not toxic to your baby. You may also hand express a few drops of breast milk and gently massage that milk into your nipples and allow the milk to air dry. In the first few weeks after giving birth, some women experience extremely full breasts (engorgement). Engorgement can make your breasts feel heavy, warm, and tender to the touch. Engorgement peaks within 3-5 days after you give birth. The following recommendations can help ease engorgement:  Completely empty your breasts while breastfeeding or pumping. You may want to start by applying warm, moist heat (in the shower or with warm water-soaked hand towels) just before feeding or pumping. This increases circulation and helps the milk   flow. If your baby does not completely empty your breasts while breastfeeding, pump any extra milk after he or she is finished.  Wear a snug bra (nursing or regular) or tank top for 1-2 days to signal your body  to slightly decrease milk production.  Apply ice packs to your breasts, unless this is too uncomfortable for you.  Make sure that your baby is latched on and positioned properly while breastfeeding. If engorgement persists after 48 hours of following these recommendations, contact your health care provider or a lactation consultant. OVERALL HEALTH CARE RECOMMENDATIONS WHILE BREASTFEEDING  Eat healthy foods. Alternate between meals and snacks, eating 3 of each per day. Because what you eat affects your breast milk, some of the foods may make your baby more irritable than usual. Avoid eating these foods if you are sure that they are negatively affecting your baby.  Drink milk, fruit juice, and water to satisfy your thirst (about 10 glasses a day).  Rest often, relax, and continue to take your prenatal vitamins to prevent fatigue, stress, and anemia.  Continue breast self-awareness checks.  Avoid chewing and smoking tobacco. Chemicals from cigarettes that pass into breast milk and exposure to secondhand smoke may harm your baby.  Avoid alcohol and drug use, including marijuana. Some medicines that may be harmful to your baby can pass through breast milk. It is important to ask your health care provider before taking any medicine, including all over-the-counter and prescription medicine as well as vitamin and herbal supplements. It is possible to become pregnant while breastfeeding. If birth control is desired, ask your health care provider about options that will be safe for your baby. SEEK MEDICAL CARE IF:  You feel like you want to stop breastfeeding or have become frustrated with breastfeeding.  You have painful breasts or nipples.  Your nipples are cracked or bleeding.  Your breasts are red, tender, or warm.  You have a swollen area on either breast.  You have a fever or chills.  You have nausea or vomiting.  You have drainage other than breast milk from your nipples.  Your  breasts do not become full before feedings by the fifth day after you give birth.  You feel sad and depressed.  Your baby is too sleepy to eat well.  Your baby is having trouble sleeping.   Your baby is wetting less than 3 diapers in a 24-hour period.  Your baby has less than 3 stools in a 24-hour period.  Your baby's skin or the white part of his or her eyes becomes yellow.   Your baby is not gaining weight by 5 days of age. SEEK IMMEDIATE MEDICAL CARE IF:  Your baby is overly tired (lethargic) and does not want to wake up and feed.  Your baby develops an unexplained fever.   This information is not intended to replace advice given to you by your health care provider. Make sure you discuss any questions you have with your health care provider.   Document Released: 01/20/2005 Document Revised: 10/11/2014 Document Reviewed: 07/14/2012 Elsevier Interactive Patient Education 2016 [employer]  
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
## 2015-06-15 ENCOUNTER — Other Ambulatory Visit (HOSPITAL_COMMUNITY): Payer: Self-pay | Admitting: Maternal and Fetal Medicine

## 2015-06-15 ENCOUNTER — Ambulatory Visit (HOSPITAL_COMMUNITY)
Admission: RE | Admit: 2015-06-15 | Discharge: 2015-06-15 | Disposition: A | Discharge status: Home / Self Care | Payer: Medicaid Other | Source: Ambulatory Visit | Attending: Obstetrics & Gynecology | Admitting: Obstetrics & Gynecology

## 2015-06-15 ENCOUNTER — Encounter (HOSPITAL_COMMUNITY): Payer: Self-pay

## 2015-06-15 ENCOUNTER — Inpatient Hospital Stay (HOSPITAL_COMMUNITY)
Admission: AD | Admit: 2015-06-15 | Discharge: 2015-06-15 | Disposition: A | Discharge status: Home / Self Care | Payer: Medicaid Other | Source: Ambulatory Visit | Attending: Obstetrics & Gynecology | Admitting: Obstetrics & Gynecology

## 2015-06-15 ENCOUNTER — Encounter (HOSPITAL_COMMUNITY): Payer: Self-pay | Admitting: *Deleted

## 2015-06-15 VITALS — BP 101/54 | HR 110 | Wt 208.8 lb

## 2015-06-15 DIAGNOSIS — O30042 Twin pregnancy, dichorionic/diamniotic, second trimester: Secondary | ICD-10-CM | POA: Insufficient documentation

## 2015-06-15 DIAGNOSIS — O99332 Smoking (tobacco) complicating pregnancy, second trimester: Secondary | ICD-10-CM | POA: Diagnosis not present

## 2015-06-15 DIAGNOSIS — Z641 Problems related to multiparity: Secondary | ICD-10-CM

## 2015-06-15 DIAGNOSIS — O99212 Obesity complicating pregnancy, second trimester: Secondary | ICD-10-CM | POA: Insufficient documentation

## 2015-06-15 DIAGNOSIS — O0990 Supervision of high risk pregnancy, unspecified, unspecified trimester: Secondary | ICD-10-CM

## 2015-06-15 DIAGNOSIS — IMO0002 Reserved for concepts with insufficient information to code with codable children: Secondary | ICD-10-CM

## 2015-06-15 DIAGNOSIS — O30041 Twin pregnancy, dichorionic/diamniotic, first trimester: Secondary | ICD-10-CM

## 2015-06-15 DIAGNOSIS — O3433 Maternal care for cervical incompetence, third trimester: Secondary | ICD-10-CM

## 2015-06-15 DIAGNOSIS — O98519 Other viral diseases complicating pregnancy, unspecified trimester: Secondary | ICD-10-CM

## 2015-06-15 DIAGNOSIS — O99210 Obesity complicating pregnancy, unspecified trimester: Secondary | ICD-10-CM

## 2015-06-15 DIAGNOSIS — O09292 Supervision of pregnancy with other poor reproductive or obstetric history, second trimester: Secondary | ICD-10-CM | POA: Diagnosis not present

## 2015-06-15 DIAGNOSIS — Z3A25 25 weeks gestation of pregnancy: Secondary | ICD-10-CM | POA: Diagnosis not present

## 2015-06-15 DIAGNOSIS — Z3A28 28 weeks gestation of pregnancy: Secondary | ICD-10-CM | POA: Diagnosis not present

## 2015-06-15 DIAGNOSIS — O09212 Supervision of pregnancy with history of pre-term labor, second trimester: Secondary | ICD-10-CM | POA: Insufficient documentation

## 2015-06-15 DIAGNOSIS — B009 Herpesviral infection, unspecified: Secondary | ICD-10-CM

## 2015-06-15 DIAGNOSIS — O30049 Twin pregnancy, dichorionic/diamniotic, unspecified trimester: Secondary | ICD-10-CM

## 2015-06-15 DIAGNOSIS — O26879 Cervical shortening, unspecified trimester: Secondary | ICD-10-CM

## 2015-06-15 DIAGNOSIS — O3443 Maternal care for other abnormalities of cervix, third trimester: Secondary | ICD-10-CM | POA: Diagnosis present

## 2015-06-15 HISTORY — DX: Anogenital (venereal) warts: A63.0

## 2015-06-15 MED ORDER — PROGESTERONE MICRONIZED 200 MG PO CAPS: ORAL_CAPSULE | ORAL | Status: DC

## 2015-06-15 MED ORDER — BETAMETHASONE SOD PHOS & ACET 6 (3-3) MG/ML IJ SUSP
12.0000 mg | Freq: Once | INTRAMUSCULAR | Status: AC
  Administered 2015-06-15: 12 mg via INTRAMUSCULAR
  Filled 2015-06-15: qty 2

## 2015-06-15 NOTE — Discharge Instructions (Signed)
Cervical Insufficiency °Cervical insufficiency is when the cervix is weak and starts to open (dilate) and thin (efface) before the pregnancy is at term and without labor starting. This is also called incompetent cervix. It can happen in the second or third trimester when the fetus starts putting pressure on the cervix. Cervical insufficiency can lead to a miscarriage, preterm premature rupture of the membranes (PPROM), or having the baby early (preterm birth).  °RISK FACTORS °You may be more likely to develop cervical insufficiency if: °· You have a shorter cervix than normal. °· Damage or injury occurred to your cervix from a past pregnancy or surgery. °· You were born with a cervical defect. °· You have had a procedure done on the cervix, such as cervical biopsy. °· You have a history of cervical insufficiency. °· You have a history of PPROM. °· You have ended several past pregnancies through abortion. °· You were exposed to the drug diethylstilbestrol (DES). °SYMPTOMS °Often times, women do not have any symptoms. Other times, women may only have mild symptoms that often start between week 14 through 20. The symptoms may last several days or weeks. These symptoms include: °· Light spotting or bleeding from the vagina. °· Pelvic pressure. °· A change in vaginal discharge, such as discharge that changes from clear, white, or light yellow to pink or tan. °· Back pain. °· Abdominal pain or cramping. °DIAGNOSIS °Cervical insufficiency cannot be diagnosed before you become pregnant. Once you are pregnant, your health care provider will ask about your medical history and if you have had any problems in past pregnancies. Tell your health care provider about any procedures performed on your cervix or if you have a history of miscarriages or cervical insufficiency. If your health care provider thinks you are at high risk for cervical insufficiency or show signs of cervical insufficiency, he or she may: °· Perform a pelvic  exam. This will check for: °¨ The presence of the membranes (amniotic sac) coming out of the cervix. °¨ Cervical abnormalities. °¨ Cervical injuries. °¨ The presence of contractions. °· Perform an ultrasonography (commonly called ultrasound) to measure the length and thickness of the cervix. °TREATMENT °If you have been diagnosed with cervical insufficiency, your health care provider may recommend: °· Limiting physical activity. °· Bed rest at home or in the hospital. °· Pelvic rest, which means no sexual intercourse or placing anything in the vagina. °· Cerclage to sew the cervix closed and prevent it from opening too early. The stitches (sutures) are removed between weeks 36 and 38 to avoid problems during labor. °Cerclage may be recommended during pregnancy if you have had a history of miscarriages or preterm births without a known cause. It may also be recommended if you have a short cervix that was identified by ultrasound or if your health care provider has found that your cervix has dilated before 24 weeks of pregnancy. Limiting physical activity and bed rest may or may not help prevent a preterm birth. °WHEN SHOULD YOU SEEK IMMEDIATE MEDICAL CARE?  °Seek immediate medical care if you show any symptoms of cervical insufficiency. You will need to go to the hospital to get checked immediately. °  °This information is not intended to replace advice given to you by your health care provider. Make sure you discuss any questions you have with your health care provider. °  °Document Released: 01/20/2005 Document Revised: 02/10/2014 Document Reviewed: 03/29/2012 °Elsevier Interactive Patient Education ©2016 [employer] ° °

## 2015-06-15 NOTE — MAU Note (Signed)
Patient was seen in MFM and noted to have shortened cervix.  Sent here for monitoring.

## 2015-06-16 ENCOUNTER — Inpatient Hospital Stay (HOSPITAL_COMMUNITY)
Admission: AD | Admit: 2015-06-16 | Discharge: 2015-06-16 | Disposition: A | Discharge status: Home / Self Care | Payer: Medicaid Other | Source: Ambulatory Visit | Attending: Obstetrics & Gynecology | Admitting: Obstetrics & Gynecology

## 2015-06-16 DIAGNOSIS — O3432 Maternal care for cervical incompetence, second trimester: Secondary | ICD-10-CM | POA: Insufficient documentation

## 2015-06-16 DIAGNOSIS — Z3A26 26 weeks gestation of pregnancy: Secondary | ICD-10-CM | POA: Diagnosis not present

## 2015-06-16 DIAGNOSIS — O30042 Twin pregnancy, dichorionic/diamniotic, second trimester: Secondary | ICD-10-CM | POA: Diagnosis not present

## 2015-06-16 MED ORDER — BETAMETHASONE SOD PHOS & ACET 6 (3-3) MG/ML IJ SUSP
12.0000 mg | Freq: Once | INTRAMUSCULAR | Status: AC
  Administered 2015-06-16: 12 mg via INTRAMUSCULAR
  Filled 2015-06-16: qty 2

## 2015-06-16 NOTE — MAU Note (Signed)
Denies contractions or bleeding at this time, no problems with prior Betamethasone injection, patient had not picked up Prometrium yet, encourage to do so, injection given.

## 2015-06-25 ENCOUNTER — Telehealth: Payer: Self-pay | Admitting: *Deleted

## 2015-06-25 DIAGNOSIS — R519 Headache, unspecified: Secondary | ICD-10-CM

## 2015-06-25 DIAGNOSIS — R51 Headache: Principal | ICD-10-CM

## 2015-06-25 MED ORDER — CYCLOBENZAPRINE HCL 10 MG PO TABS: 10.0000 mg | ORAL_TABLET | Freq: Three times a day (TID) | ORAL | Status: DC | PRN

## 2015-06-25 MED ORDER — BUTALBITAL-APAP-CAFFEINE 50-325-40 MG PO CAPS: 1.0000 | ORAL_CAPSULE | Freq: Four times a day (QID) | ORAL | Status: DC | PRN

## 2015-06-25 NOTE — Telephone Encounter (Signed)
-----   Message from Olevia BowensJacinda S Battle sent at 06/25/2015  2:37 PM EDT ----- Regarding: Advise Contact: [phone] OB 26.5wk, Headache left side, No relief w/ tynenol, Please advise

## 2015-06-25 NOTE — Telephone Encounter (Signed)
Pt states she has had headache on left side for 3 days, no relief with tylenol. She denies neurological symptoms, no visual changes, no URI Sx. Per verbal Dr. Jolayne Pantheronstant, Rx Fioricet and Flexeril sent to pharmacy. Pt expressed understanding.

## 2015-06-26 ENCOUNTER — Encounter (HOSPITAL_COMMUNITY): Payer: Self-pay | Admitting: *Deleted

## 2015-06-26 ENCOUNTER — Inpatient Hospital Stay (HOSPITAL_COMMUNITY)
Admission: AD | Admit: 2015-06-26 | Discharge: 2015-07-04 | DRG: 765 | Disposition: A | Discharge status: Home / Self Care | Payer: Medicaid Other | Source: Ambulatory Visit | Attending: Family Medicine | Admitting: Family Medicine

## 2015-06-26 DIAGNOSIS — Z3A27 27 weeks gestation of pregnancy: Secondary | ICD-10-CM

## 2015-06-26 DIAGNOSIS — Z720 Tobacco use: Secondary | ICD-10-CM | POA: Diagnosis present

## 2015-06-26 DIAGNOSIS — O98519 Other viral diseases complicating pregnancy, unspecified trimester: Secondary | ICD-10-CM

## 2015-06-26 DIAGNOSIS — O09212 Supervision of pregnancy with history of pre-term labor, second trimester: Secondary | ICD-10-CM

## 2015-06-26 DIAGNOSIS — O99214 Obesity complicating childbirth: Secondary | ICD-10-CM | POA: Diagnosis present

## 2015-06-26 DIAGNOSIS — O30042 Twin pregnancy, dichorionic/diamniotic, second trimester: Secondary | ICD-10-CM

## 2015-06-26 DIAGNOSIS — O26879 Cervical shortening, unspecified trimester: Secondary | ICD-10-CM

## 2015-06-26 DIAGNOSIS — F329 Major depressive disorder, single episode, unspecified: Secondary | ICD-10-CM | POA: Diagnosis present

## 2015-06-26 DIAGNOSIS — O30049 Twin pregnancy, dichorionic/diamniotic, unspecified trimester: Secondary | ICD-10-CM | POA: Diagnosis present

## 2015-06-26 DIAGNOSIS — F17210 Nicotine dependence, cigarettes, uncomplicated: Secondary | ICD-10-CM | POA: Diagnosis present

## 2015-06-26 DIAGNOSIS — O09292 Supervision of pregnancy with other poor reproductive or obstetric history, second trimester: Secondary | ICD-10-CM

## 2015-06-26 DIAGNOSIS — Z3A26 26 weeks gestation of pregnancy: Secondary | ICD-10-CM

## 2015-06-26 DIAGNOSIS — O9832 Other infections with a predominantly sexual mode of transmission complicating childbirth: Secondary | ICD-10-CM | POA: Diagnosis present

## 2015-06-26 DIAGNOSIS — Z91410 Personal history of adult physical and sexual abuse: Secondary | ICD-10-CM

## 2015-06-26 DIAGNOSIS — Z888 Allergy status to other drugs, medicaments and biological substances status: Secondary | ICD-10-CM

## 2015-06-26 DIAGNOSIS — Z6836 Body mass index (BMI) 36.0-36.9, adult: Secondary | ICD-10-CM | POA: Diagnosis not present

## 2015-06-26 DIAGNOSIS — O09892 Supervision of other high risk pregnancies, second trimester: Secondary | ICD-10-CM

## 2015-06-26 DIAGNOSIS — Z641 Problems related to multiparity: Secondary | ICD-10-CM

## 2015-06-26 DIAGNOSIS — Z23 Encounter for immunization: Secondary | ICD-10-CM | POA: Diagnosis not present

## 2015-06-26 DIAGNOSIS — O99334 Smoking (tobacco) complicating childbirth: Secondary | ICD-10-CM | POA: Diagnosis present

## 2015-06-26 DIAGNOSIS — O321XX1 Maternal care for breech presentation, fetus 1: Secondary | ICD-10-CM | POA: Diagnosis present

## 2015-06-26 DIAGNOSIS — A6000 Herpesviral infection of urogenital system, unspecified: Secondary | ICD-10-CM | POA: Diagnosis present

## 2015-06-26 DIAGNOSIS — O99212 Obesity complicating pregnancy, second trimester: Secondary | ICD-10-CM

## 2015-06-26 DIAGNOSIS — O99332 Smoking (tobacco) complicating pregnancy, second trimester: Secondary | ICD-10-CM

## 2015-06-26 DIAGNOSIS — O99210 Obesity complicating pregnancy, unspecified trimester: Secondary | ICD-10-CM | POA: Diagnosis present

## 2015-06-26 DIAGNOSIS — O0990 Supervision of high risk pregnancy, unspecified, unspecified trimester: Secondary | ICD-10-CM

## 2015-06-26 DIAGNOSIS — B009 Herpesviral infection, unspecified: Secondary | ICD-10-CM | POA: Diagnosis present

## 2015-06-26 DIAGNOSIS — F32A Depression, unspecified: Secondary | ICD-10-CM | POA: Diagnosis present

## 2015-06-26 DIAGNOSIS — G44229 Chronic tension-type headache, not intractable: Secondary | ICD-10-CM | POA: Diagnosis not present

## 2015-06-26 HISTORY — DX: Reserved for concepts with insufficient information to code with codable children: IMO0002

## 2015-06-26 LAB — URINALYSIS, ROUTINE W REFLEX MICROSCOPIC
Bilirubin Urine: NEGATIVE
Glucose, UA: NEGATIVE mg/dL
Hgb urine dipstick: NEGATIVE
KETONES UR: 15 mg/dL — AB
LEUKOCYTES UA: NEGATIVE
NITRITE: NEGATIVE
PROTEIN: NEGATIVE mg/dL
Specific Gravity, Urine: >1.03 — ABNORMAL HIGH (ref 1.005–1.030)
pH: 5.5 (ref 5.0–8.0)

## 2015-06-26 MED ORDER — LACTATED RINGERS IV SOLN
INTRAVENOUS | Status: DC
  Filled 2015-06-26: qty 1000

## 2015-06-26 MED ORDER — ZOLPIDEM TARTRATE 5 MG PO TABS
5.0000 mg | ORAL_TABLET | Freq: Every evening | ORAL | Status: DC | PRN
  Administered 2015-06-27 – 2015-06-29 (×3): 5 mg via ORAL
  Filled 2015-06-26 (×4): qty 1

## 2015-06-26 MED ORDER — MAGNESIUM SULFATE BOLUS VIA INFUSION
4.0000 g | Freq: Once | INTRAVENOUS | Status: AC
  Administered 2015-06-26: 4 g via INTRAVENOUS
  Filled 2015-06-26: qty 500

## 2015-06-26 MED ORDER — LACTATED RINGERS IV SOLN
INTRAVENOUS | Status: DC
  Administered 2015-06-26 – 2015-07-01 (×9): via INTRAVENOUS

## 2015-06-26 MED ORDER — MAGNESIUM SULFATE 50 % IJ SOLN
2.0000 g/h | INTRAVENOUS | Status: DC
  Filled 2015-06-26: qty 80

## 2015-06-26 MED ORDER — PRENATAL MULTIVITAMIN CH
1.0000 | ORAL_TABLET | Freq: Every day | ORAL | Status: DC
  Administered 2015-06-28 – 2015-06-30 (×3): 1 via ORAL
  Filled 2015-06-26 (×5): qty 1

## 2015-06-26 MED ORDER — VALACYCLOVIR HCL 500 MG PO TABS
500.0000 mg | ORAL_TABLET | Freq: Every day | ORAL | Status: DC
  Administered 2015-06-27 – 2015-06-30 (×4): 500 mg via ORAL
  Filled 2015-06-26 (×5): qty 1

## 2015-06-26 MED ORDER — CALCIUM CARBONATE ANTACID 500 MG PO CHEW: 2.0000 | CHEWABLE_TABLET | ORAL | Status: DC | PRN

## 2015-06-26 MED ORDER — DOCUSATE SODIUM 100 MG PO CAPS
100.0000 mg | ORAL_CAPSULE | Freq: Every day | ORAL | Status: DC
  Administered 2015-06-27 – 2015-07-03 (×6): 100 mg via ORAL
  Filled 2015-06-26 (×6): qty 1

## 2015-06-26 MED ORDER — ACETAMINOPHEN 325 MG PO TABS: 650.0000 mg | ORAL_TABLET | ORAL | Status: DC | PRN

## 2015-06-26 MED ORDER — PROGESTERONE MICRONIZED 200 MG PO CAPS
200.0000 mg | ORAL_CAPSULE | Freq: Every day | ORAL | Status: DC
  Filled 2015-06-26: qty 1
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
## 2015-06-26 NOTE — MAU Note (Signed)
Patient presents at 5626 weeks twin gestation with c/o sporadic contractions X 1 month and pelvic pressure since 1630 today. Fetus' are active. Denies bleeding or discharge.

## 2015-06-27 ENCOUNTER — Encounter: Payer: Medicaid Other | Admitting: Obstetrics and Gynecology

## 2015-06-27 ENCOUNTER — Encounter (HOSPITAL_COMMUNITY): Payer: Self-pay | Admitting: *Deleted

## 2015-06-27 DIAGNOSIS — G44229 Chronic tension-type headache, not intractable: Secondary | ICD-10-CM

## 2015-06-27 LAB — WET PREP, GENITAL
Clue Cells Wet Prep HPF POC: NONE SEEN
Sperm: NONE SEEN
Trich, Wet Prep: NONE SEEN
Yeast Wet Prep HPF POC: NONE SEEN

## 2015-06-27 LAB — GC/CHLAMYDIA PROBE AMP (~~LOC~~) NOT AT ARMC
CHLAMYDIA, DNA PROBE: NEGATIVE
NEISSERIA GONORRHEA: NEGATIVE

## 2015-06-27 MED ORDER — ACETAMINOPHEN 500 MG PO TABS
1000.0000 mg | ORAL_TABLET | Freq: Four times a day (QID) | ORAL | Status: DC | PRN
  Administered 2015-06-27: 1000 mg via ORAL
  Filled 2015-06-27: qty 2

## 2015-06-27 MED ORDER — PANTOPRAZOLE SODIUM 40 MG PO TBEC
40.0000 mg | DELAYED_RELEASE_TABLET | Freq: Every day | ORAL | Status: DC
  Administered 2015-06-27 – 2015-06-30 (×4): 40 mg via ORAL
  Filled 2015-06-27 (×4): qty 1

## 2015-06-27 MED ORDER — PROGESTERONE MICRONIZED 200 MG PO CAPS
200.0000 mg | ORAL_CAPSULE | Freq: Every day | ORAL | Status: DC
  Administered 2015-06-27 – 2015-06-30 (×4): 200 mg via VAGINAL
  Filled 2015-06-27 (×4): qty 1

## 2015-06-27 MED ORDER — INDOMETHACIN 25 MG PO CAPS
25.0000 mg | ORAL_CAPSULE | Freq: Four times a day (QID) | ORAL | Status: AC
  Administered 2015-06-27 – 2015-06-30 (×11): 25 mg via ORAL
  Filled 2015-06-27 (×12): qty 1

## 2015-06-27 MED ORDER — CYCLOBENZAPRINE HCL 10 MG PO TABS
10.0000 mg | ORAL_TABLET | Freq: Three times a day (TID) | ORAL | Status: DC | PRN
  Filled 2015-06-27 (×2): qty 1

## 2015-06-27 MED ORDER — BETAMETHASONE SOD PHOS & ACET 6 (3-3) MG/ML IJ SUSP
12.0000 mg | Freq: Once | INTRAMUSCULAR | Status: AC
  Administered 2015-06-27: 12 mg via INTRAMUSCULAR
  Filled 2015-06-27: qty 2

## 2015-06-27 MED ORDER — BETAMETHASONE SOD PHOS & ACET 6 (3-3) MG/ML IJ SUSP
12.0000 mg | Freq: Once | INTRAMUSCULAR | Status: AC
  Administered 2015-06-28: 12 mg via INTRAMUSCULAR
  Filled 2015-06-27: qty 2

## 2015-06-27 MED ORDER — NIFEDIPINE ER OSMOTIC RELEASE 30 MG PO TB24
30.0000 mg | ORAL_TABLET | Freq: Two times a day (BID) | ORAL | Status: DC
  Administered 2015-06-27 – 2015-06-30 (×9): 30 mg via ORAL
  Filled 2015-06-27 (×10): qty 1

## 2015-06-27 MED ORDER — INDOMETHACIN 50 MG PO CAPS
50.0000 mg | ORAL_CAPSULE | Freq: Once | ORAL | Status: AC
  Administered 2015-06-27: 50 mg via ORAL
  Filled 2015-06-27: qty 1

## 2015-06-27 MED ORDER — PENICILLIN G POTASSIUM 5000000 UNITS IJ SOLR
5.0000 10*6.[IU] | Freq: Once | INTRAVENOUS | Status: AC
  Administered 2015-06-27: 5 10*6.[IU] via INTRAVENOUS
  Filled 2015-06-27: qty 5

## 2015-06-27 MED ORDER — PENICILLIN G POTASSIUM 5000000 UNITS IJ SOLR
2.5000 10*6.[IU] | INTRAVENOUS | Status: DC
  Administered 2015-06-27 – 2015-06-29 (×8): 2.5 10*6.[IU] via INTRAVENOUS
  Filled 2015-06-27 (×13): qty 2.5
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
## 2015-06-27 NOTE — Consult Note (Signed)
[facility]  P[facility] Consult       06/27/2015  11:13 AM   I was asked by Dr. Shawnie PonsPratt to consult on this patient for possible preterm delivery.  I had the pleasure of meeting with Mrs. Natale MilchLancaster today.  She is a 30 y/o [id number] at 2527 and 0/[redacted] weeks gestation with di-di twins (boy and girl) who was admitted this morning in preterm labor.  She is contracting and is dilated to 4 cm but is not ruptured.  She received BMZ on 5/12 and 5/13 for threatened preterm labor, and will be eligible for a 2nd course on 5/26 and 5/27.  She is now receiving Magnesium and Procardia.  Her pregnancy has also complicated by HSV (she has no lesions and has been started on Valtrex supression), smoking, obesity, and depression.    I explained that should she deliver early the neonatal intensive care team would be present for the delivery.  I outlined the likely delivery room course for the twins including routine resuscitation and NRP-guided approaches to the treatment of respiratory distress. We discussed other common problems associated with prematurity including respiratory distress syndrome/CLD, apnea, feeding issues, temperature regulation, and infection risk.  We briefly discussed IVH/PVL, ROP, and NEC and that these are complications associated with prematurity, and that if she delivers after 30 weeks these are uncommon.    We discussed the average length of stay but I noted that the actual LOS would depend on the severity of problems encountered and response to treatments.  We discussed visitation policies and the resources available while her twins are the hospital.  We discussed the importance of good nutrition and various methods of providing nutrition (parenteral hyperalimentation, gavage feedings and/or oral feeding). We discussed the benefits of human milk. I encouraged breast feeding and pumping soon after birth and outlined resources that are available to support breast feeding.  She does intend  to provide breast milk for her infants.  Thank you for involving us in the care of this patient. A member of our team will be available should the family have additional questions.  Time for consultation approximately 45 minutes.   _____________________ Electronically Signed By: Maryan CharLindsey Tennyson Kallen, MD Neonatologist

## 2015-06-27 NOTE — Progress Notes (Addendum)
Initial visit with Dondra PraderDominique who is pregnant with her 827th and 8th children, Somaliaeagan and Gabonamsey.  She reports she is able to manage so many children because she's very patient and tries to be humble.  She expressed joy in playing with her children and gratitude that her oldest child is very helpful.  She did not express concern about her labor progressing.  It is unclear whether she is trying to remain positive or if she is not fully aware of the situation.  She stated that she's just trying to keep them in as long as she can.  Will continue to follow.  Please page as further needs arise.  Maryanna ShapeAmanda M. Carley Hammedavee Lomax, M.Div. [facility] Chaplain Pager [phone] Office [phone]       06/27/15 1517  Clinical Encounter Type  Visited With Patient  Visit Type Initial;Spiritual support;Social support  Referral From Chaplain  Consult/Referral To [facility]
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
## 2015-06-27 NOTE — Progress Notes (Signed)
Patient ID: Alicia Pearson Cagley, female   DOB: 08-17-1985, 30 y.o.   MRN: [id number] FACULTY PRACTICE ANTEPARTUM(COMPREHENSIVE) NOTE  Alicia Pearson Moodie is Pearson 30 y.o. [id number] at 2720w0d by early ultrasound who is admitted for Preterm labor.   Fetal presentation is cephalic and breech. Length of Stay:  1  Days  Subjective: Patient reports headache. Contractions and pain have spaced out. Patient reports the fetal movement as active. Patient reports uterine contraction  activity as regular, every 8-10 minutes. Patient reports  vaginal bleeding as none. Patient describes fluid per vagina as None.  Vitals:  Blood pressure 123/68, pulse 79, temperature 97 F (36.1 C), temperature source Axillary, resp. rate 18, height 5\' 3"  (1.6 m), weight 208 lb (94.348 kg), last menstrual period 12/28/2014, SpO2 97 %, not currently breastfeeding. Physical Examination:  General appearance - alert, well appearing, and in no distress Chest - normal effort Abdomen - gravid, NT Fundal Height:  size equals dates Extremities: extremities normal, atraumatic, no cyanosis or edema  Membranes:intact  Fetal Monitoring: Pearson Baseline: 120 bpm, Variability: Good {> 6 bpm), Accelerations: Non-reactive but appropriate for gestational age and Decelerations: Absent B Baseline: 125 bpm, Variability: Good {> 6 bpm), Accelerations: Non-reactive but appropriate for gestational age and Decelerations: Absent  Labs:  Results for orders placed or performed during the hospital encounter of 06/26/15 (from the past 24 hour(s))  Urinalysis, Routine w reflex microscopic ([facility])   Collection Time: 06/26/15  8:40 PM  Result Value Ref Range   Color, Urine YELLOW YELLOW   APPearance CLEAR CLEAR   Specific Gravity, Urine >1.030 (H) 1.005 - 1.030   pH 5.5 5.0 - 8.0   Glucose, UA NEGATIVE NEGATIVE mg/dL   Hgb urine dipstick NEGATIVE NEGATIVE   Bilirubin Urine NEGATIVE NEGATIVE   Ketones, ur 15 (Pearson) NEGATIVE mg/dL   Protein, ur  NEGATIVE NEGATIVE mg/dL   Nitrite NEGATIVE NEGATIVE   Leukocytes, UA NEGATIVE NEGATIVE  Type and screen [facility]   Collection Time: 06/26/15 10:40 PM  Result Value Ref Range   ABO/RH(D) B POS    Antibody Screen POS    Sample Expiration 06/29/2015    DAT, IgG NEG    Antibody Identification NO CLINICALLY SIGNIFICANT ANTIBODY IDENTIFIED    Unit Number [id number]    Blood Component Type RED CELLS,LR    Unit division 00    Status of Unit ALLOCATED    Transfusion Status OK TO TRANSFUSE    Crossmatch Result COMPATIBLE   Wet prep, genital   Collection Time: 06/27/15  3:15 AM  Result Value Ref Range   Yeast Wet Prep HPF POC NONE SEEN NONE SEEN   Trich, Wet Prep NONE SEEN NONE SEEN   Clue Cells Wet Prep HPF POC NONE SEEN NONE SEEN   WBC, Wet Prep HPF POC MANY (Pearson) NONE SEEN   Sperm NONE SEEN     Medications:  Scheduled . docusate sodium  100 mg Oral Daily  . NIFEdipine  30 mg Oral BID  . prenatal multivitamin  1 tablet Oral Q1200  . progesterone  200 mg Vaginal Daily  . valACYclovir  500 mg Oral Daily   I have reviewed the patient's current medications.  ASSESSMENT: Principal Problem:   Preterm labor Active Problems:   Supervision of high risk pregnancy, antepartum   Herpes infection during pregnancy, antepartum   Grand multipara   Obesity in pregnancy, antepartum   Dichorionic diamniotic twin pregnancy   Tobacco abuse   Depression Headache  PLAN:  Continue Magnesium Sulfate x 12-24 hours Continue Procardia Continue Prometrium Repeat BMZ 5/26-5/27 May advance diet as tolerated Flexeril for heachache prn NICU consult  Reva Bores, MD 06/27/2015,7:35 AM

## 2015-06-27 NOTE — Progress Notes (Signed)
Attempted to visit Ms Natale MilchLancaster to introduce spiritual care services and offer additional support due to preterm labor.  Spoke with pt RN, Cathlean SauerHarmony, who notified me that Dondra PraderDominique is sleeping right now.  Will continue to follow.  Please page as further needs arise.  Maryanna ShapeAmanda M. Carley Hammedavee Lomax, M.Div. [facility] Chaplain Pager [phone] Office [phone]

## 2015-06-28 ENCOUNTER — Encounter: Payer: Medicaid Other | Admitting: Obstetrics & Gynecology

## 2015-06-28 DIAGNOSIS — Z3A27 27 weeks gestation of pregnancy: Secondary | ICD-10-CM

## 2015-06-28 DIAGNOSIS — O30042 Twin pregnancy, dichorionic/diamniotic, second trimester: Secondary | ICD-10-CM

## 2015-06-28 LAB — URINE CULTURE
Culture: NO GROWTH
Special Requests: NORMAL
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
## 2015-06-28 NOTE — Progress Notes (Signed)
I made multiple attempts to follow up with Alicia Pearson over the course of the day, but she was not available.    Pt was aware of how to reach spiritual care services. Please page as further needs arise.  Maryanna ShapeAmanda M. Carley Hammedavee Lomax, M.Div. [facility] Chaplain Pager [phone] Office [phone]

## 2015-06-28 NOTE — Progress Notes (Signed)
Patient ID: Norton BlizzardDominique A Windom, female   DOB: 1985-11-20, 30 y.o.   MRN: [id number] Patient ID: Norton BlizzardDominique A Rosebrook, female   DOB: 1985-11-20, 30 y.o.   MRN: [id number] FACULTY PRACTICE ANTEPARTUM(COMPREHENSIVE) NOTE  Norton BlizzardDominique A Salzwedel is a 30 y.o. [id number] at 2959w1d by early ultrasound who is admitted for Preterm labor.   Fetal presentation is cephalic and breech. Length of Stay:  2  Days  Subjective:  Contractions and pain have spaced out. Patient reports the fetal movement as active. Patient reports uterine contraction  activity as regular, every 8-10 minutes. Patient reports  vaginal bleeding as none. Patient describes fluid per vagina as None.  Vitals:  Blood pressure 101/58, pulse 82, temperature 98.3 F (36.8 C), temperature source Oral, resp. rate 17, height 5\' 3"  (1.6 m), weight 208 lb (94.348 kg), last menstrual period 12/28/2014, SpO2 97 %, not currently breastfeeding. Physical Examination:  General appearance - alert, well appearing, and in no distress Chest - normal effort Abdomen - gravid, NT Fundal Height:  size equals dates Extremities: extremities normal, atraumatic, no cyanosis or edema  Membranes:intact  Fetal Monitoring: A Baseline: 120 bpm, Variability: Good {> 6 bpm), Accelerations: Non-reactive but appropriate for gestational age and Decelerations: Absent B Baseline: 125 bpm, Variability: Good {> 6 bpm), Accelerations: Non-reactive but appropriate for gestational age and Decelerations: Absent  Labs:  No results found for this or any previous visit (from the past 24 hour(s)).  Medications:  Scheduled . betamethasone acetate-betamethasone sodium phosphate  12 mg Intramuscular Once  . docusate sodium  100 mg Oral Daily  . indomethacin  25 mg Oral Q6H  . NIFEdipine  30 mg Oral BID  . pantoprazole  40 mg Oral Daily  . pencillin G potassium IV  2.5 Million Units Intravenous Q4H  . prenatal multivitamin  1 tablet Oral Q1200  . progesterone  200 mg Vaginal  Daily  . valACYclovir  500 mg Oral Daily   I have reviewed the patient's current medications.  ASSESSMENT: Principal Problem:   Preterm labor Active Problems:   Tobacco abuse   Depression   Herpes infection during pregnancy, antepartum   Grand multipara   Obesity in pregnancy, antepartum   Supervision of high risk pregnancy, antepartum   Dichorionic diamniotic twin pregnancy Headache  PLAN:  Continue Procardia, indomethicin Continue Prometrium Repeat BMZ 5/26-5/27 May advance diet as tolerated Flexeril for heachache prn NICU consult  Lazaro ArmsEURE,Shannah Conteh H, MD 06/28/2015,9:15 AM

## 2015-06-29 ENCOUNTER — Inpatient Hospital Stay (HOSPITAL_COMMUNITY): Payer: Medicaid Other

## 2015-06-29 ENCOUNTER — Ambulatory Visit (HOSPITAL_COMMUNITY): Admit: 2015-06-29 | Payer: Medicaid Other

## 2015-06-29 DIAGNOSIS — Z3A27 27 weeks gestation of pregnancy: Secondary | ICD-10-CM

## 2015-06-29 DIAGNOSIS — O30042 Twin pregnancy, dichorionic/diamniotic, second trimester: Secondary | ICD-10-CM

## 2015-06-29 LAB — CULTURE, BETA STREP (GROUP B ONLY)

## 2015-06-29 MED ORDER — DIPHENHYDRAMINE HCL 25 MG PO CAPS
50.0000 mg | ORAL_CAPSULE | Freq: Every evening | ORAL | Status: DC | PRN
  Administered 2015-06-29 – 2015-06-30 (×2): 50 mg via ORAL
  Filled 2015-06-29 (×3): qty 2
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
## 2015-06-30 LAB — TYPE AND SCREEN
ABO/RH(D): B POS
Antibody Screen: POSITIVE
DAT, IGG: NEGATIVE
UNIT DIVISION: 0
Unit division: 0

## 2015-06-30 LAB — CBC
HEMATOCRIT: 33.1 % — AB (ref 36.0–46.0)
HEMOGLOBIN: 11.3 g/dL — AB (ref 12.0–15.0)
MCH: 32 pg (ref 26.0–34.0)
MCHC: 34.1 g/dL (ref 30.0–36.0)
MCV: 93.8 fL (ref 78.0–100.0)
Platelets: 208 10*3/uL (ref 150–400)
RBC: 3.53 MIL/uL — ABNORMAL LOW (ref 3.87–5.11)
RDW: 14.5 % (ref 11.5–15.5)
WBC: 11.5 10*3/uL — AB (ref 4.0–10.5)

## 2015-06-30 NOTE — Progress Notes (Signed)
Patient ID: Alicia Pearson, female   DOB: January 19, 1986, 30 y.o.  Norton Blizzard MRN: [id number] FACULTY PRACTICE ANTEPARTUM(COMPREHENSIVE) NOTE  Norton BlizzardDominique A Pharr is a 30 y.o. [id number] at 6331w3d by best clinical estimate who is admitted for Preterm labor.   Fetal presentation is breech and vtx. Length of Stay:  4  Days  Subjective: Pain and pressure is improved Patient reports the fetal movement as active. Patient reports uterine contraction  activity as none. Patient reports  vaginal bleeding as none. Patient describes fluid per vagina as pink tinged, lighter than previous.  Vitals:  Blood pressure 99/52, pulse 70, temperature 98.5 F (36.9 C), temperature source Oral, resp. rate 18, height 5\' 3"  (1.6 m), weight 208 lb (94.348 kg), last menstrual period 12/28/2014, SpO2 98 %, not currently breastfeeding. Physical Examination:  General appearance - alert, well appearing, and in no distress Chest - normal effort Abdomen - gravid, NT Fundal Height:  size equals dates Extremities: extremities normal, atraumatic, no cyanosis or edema  Membranes:intact  Fetal Monitoring:A  Baseline: 140 bpm, Variability: Good {> 6 bpm), Accelerations: Non-reactive but appropriate for gestational age and Decelerations: Absent  B Baseline: 135 bpm, Variability: Good {> 6 bpm), Accelerations: Non-reactive but appropriate for gestational age and Decelerations: Absent   Medications:  Scheduled . docusate sodium  100 mg Oral Daily  . indomethacin  25 mg Oral Q6H  . NIFEdipine  30 mg Oral BID  . pantoprazole  40 mg Oral Daily  . prenatal multivitamin  1 tablet Oral Q1200  . progesterone  200 mg Vaginal Daily  . valACYclovir  500 mg Oral Daily   I have reviewed the patient's current medications.  ASSESSMENT: Principal Problem:   Preterm labor Active Problems:   Supervision of high risk pregnancy, antepartum   Herpes infection during pregnancy, antepartum   Grand multipara   Obesity in pregnancy, antepartum  Dichorionic diamniotic twin pregnancy   Tobacco abuse   Depression   PLAN: Continue Procardia and Prometrium Continue inpatient management until 28 wks.  Reva BoresPRATT,Nazar Kuan S, MD 06/30/2015,7:37 AM

## 2015-07-01 ENCOUNTER — Inpatient Hospital Stay (HOSPITAL_COMMUNITY): Payer: Medicaid Other | Admitting: Anesthesiology

## 2015-07-01 ENCOUNTER — Encounter (HOSPITAL_COMMUNITY)
Admission: AD | Disposition: A | Discharge status: Home / Self Care | Payer: Self-pay | Source: Ambulatory Visit | Attending: Family Medicine

## 2015-07-01 ENCOUNTER — Encounter (HOSPITAL_COMMUNITY): Payer: Self-pay | Admitting: Anesthesiology

## 2015-07-01 DIAGNOSIS — O30042 Twin pregnancy, dichorionic/diamniotic, second trimester: Secondary | ICD-10-CM

## 2015-07-01 LAB — CBC
HEMATOCRIT: 35.6 % — AB (ref 36.0–46.0)
HEMOGLOBIN: 12.4 g/dL (ref 12.0–15.0)
MCH: 33.3 pg (ref 26.0–34.0)
MCHC: 34.8 g/dL (ref 30.0–36.0)
MCV: 95.7 fL (ref 78.0–100.0)
Platelets: 219 10*3/uL (ref 150–400)
RBC: 3.72 MIL/uL — AB (ref 3.87–5.11)
RDW: 14.2 % (ref 11.5–15.5)
WBC: 13.4 10*3/uL — ABNORMAL HIGH (ref 4.0–10.5)

## 2015-07-01 SURGERY — Surgical Case
Anesthesia: Spinal

## 2015-07-01 MED ORDER — LACTATED RINGERS IV SOLN
2.0000 g/h | INTRAVENOUS | Status: DC
  Administered 2015-07-01: 2 g/h via INTRAVENOUS
  Filled 2015-07-01: qty 80

## 2015-07-01 MED ORDER — SODIUM CHLORIDE 0.9% FLUSH: 3.0000 mL | INTRAVENOUS | Status: DC | PRN

## 2015-07-01 MED ORDER — NALOXONE HCL 2 MG/2ML IJ SOSY
1.0000 ug/kg/h | PREFILLED_SYRINGE | INTRAMUSCULAR | Status: DC | PRN
  Filled 2015-07-01: qty 2

## 2015-07-01 MED ORDER — FENTANYL CITRATE (PF) 100 MCG/2ML IJ SOLN
INTRAMUSCULAR | Status: DC | PRN
  Administered 2015-07-01: 50 ug via INTRAVENOUS
  Administered 2015-07-01: 10 ug via INTRATHECAL
  Administered 2015-07-01: 40 ug via INTRAVENOUS

## 2015-07-01 MED ORDER — DIPHENHYDRAMINE HCL 50 MG/ML IJ SOLN
12.5000 mg | INTRAMUSCULAR | Status: DC | PRN
  Administered 2015-07-01: 12.5 mg via INTRAVENOUS
  Filled 2015-07-01: qty 1

## 2015-07-01 MED ORDER — LACTATED RINGERS IV SOLN
INTRAVENOUS | Status: DC | PRN
  Administered 2015-07-01: 09:00:00 via INTRAVENOUS

## 2015-07-01 MED ORDER — ONDANSETRON HCL 4 MG/2ML IJ SOLN: 4.0000 mg | Freq: Three times a day (TID) | INTRAMUSCULAR | Status: DC | PRN

## 2015-07-01 MED ORDER — LACTATED RINGERS IV SOLN
INTRAVENOUS | Status: DC
  Administered 2015-07-01: 12:00:00 via INTRAVENOUS

## 2015-07-01 MED ORDER — SIMETHICONE 80 MG PO CHEW: 80.0000 mg | CHEWABLE_TABLET | ORAL | Status: DC | PRN

## 2015-07-01 MED ORDER — PRENATAL MULTIVITAMIN CH
1.0000 | ORAL_TABLET | Freq: Every day | ORAL | Status: DC
  Administered 2015-07-01 – 2015-07-03 (×3): 1 via ORAL
  Filled 2015-07-01 (×2): qty 1

## 2015-07-01 MED ORDER — WITCH HAZEL-GLYCERIN EX PADS: 1.0000 "application " | MEDICATED_PAD | CUTANEOUS | Status: DC | PRN

## 2015-07-01 MED ORDER — PHENYLEPHRINE 8 MG IN D5W 100 ML (0.08MG/ML) PREMIX OPTIME
INJECTION | INTRAVENOUS | Status: DC | PRN
  Administered 2015-07-01: 60 ug/min via INTRAVENOUS

## 2015-07-01 MED ORDER — FENTANYL CITRATE (PF) 100 MCG/2ML IJ SOLN
INTRAMUSCULAR | Status: AC
  Filled 2015-07-01: qty 2

## 2015-07-01 MED ORDER — NALBUPHINE HCL 10 MG/ML IJ SOLN: 5.0000 mg | INTRAMUSCULAR | Status: DC | PRN

## 2015-07-01 MED ORDER — OXYTOCIN 10 UNIT/ML IJ SOLN
40.0000 [IU] | INTRAMUSCULAR | Status: DC | PRN
  Administered 2015-07-01: 40 [IU] via INTRAVENOUS

## 2015-07-01 MED ORDER — OXYTOCIN 40 UNITS IN LACTATED RINGERS INFUSION - SIMPLE MED: 2.5000 [IU]/h | INTRAVENOUS | Status: AC

## 2015-07-01 MED ORDER — MAGNESIUM SULFATE BOLUS VIA INFUSION
4.0000 g | Freq: Once | INTRAVENOUS | Status: AC
  Administered 2015-07-01: 4 g via INTRAVENOUS
  Filled 2015-07-01: qty 500

## 2015-07-01 MED ORDER — NIFEDIPINE 10 MG PO CAPS
10.0000 mg | ORAL_CAPSULE | Freq: Once | ORAL | Status: AC
  Administered 2015-07-01: 10 mg via ORAL
  Filled 2015-07-01: qty 1

## 2015-07-01 MED ORDER — TETANUS-DIPHTH-ACELL PERTUSSIS 5-2.5-18.5 LF-MCG/0.5 IM SUSP
0.5000 mL | Freq: Once | INTRAMUSCULAR | Status: AC
  Administered 2015-07-03: 0.5 mL via INTRAMUSCULAR
  Filled 2015-07-01: qty 0.5

## 2015-07-01 MED ORDER — NALBUPHINE HCL 10 MG/ML IJ SOLN: 5.0000 mg | Freq: Once | INTRAMUSCULAR | Status: DC | PRN

## 2015-07-01 MED ORDER — CEFAZOLIN SODIUM-DEXTROSE 2-4 GM/100ML-% IV SOLN
2.0000 g | INTRAVENOUS | Status: AC
Start: 2015-07-01 — End: 2015-07-01
  Administered 2015-07-01: 2 g via INTRAVENOUS
  Filled 2015-07-01: qty 100

## 2015-07-01 MED ORDER — OXYCODONE-ACETAMINOPHEN 5-325 MG PO TABS
1.0000 | ORAL_TABLET | ORAL | Status: DC | PRN
  Administered 2015-07-01 – 2015-07-04 (×4): 1 via ORAL
  Filled 2015-07-01 (×4): qty 1

## 2015-07-01 MED ORDER — BUPIVACAINE HCL (PF) 0.25 % IJ SOLN
INTRAMUSCULAR | Status: DC | PRN
  Administered 2015-07-01: 30 mL

## 2015-07-01 MED ORDER — SCOPOLAMINE 1 MG/3DAYS TD PT72: 1.0000 | MEDICATED_PATCH | Freq: Once | TRANSDERMAL | Status: DC

## 2015-07-01 MED ORDER — SENNOSIDES-DOCUSATE SODIUM 8.6-50 MG PO TABS
2.0000 | ORAL_TABLET | ORAL | Status: DC
  Administered 2015-07-01 – 2015-07-03 (×3): 2 via ORAL
  Filled 2015-07-01 (×3): qty 2

## 2015-07-01 MED ORDER — OXYCODONE-ACETAMINOPHEN 5-325 MG PO TABS
2.0000 | ORAL_TABLET | ORAL | Status: DC | PRN
  Administered 2015-07-02 – 2015-07-03 (×5): 2 via ORAL
  Filled 2015-07-01 (×6): qty 2

## 2015-07-01 MED ORDER — MORPHINE SULFATE (PF) 0.5 MG/ML IJ SOLN
INTRAMUSCULAR | Status: DC | PRN
Start: 2015-07-01 — End: 2015-07-01
  Administered 2015-07-01: .2 mg via INTRATHECAL
  Administered 2015-07-01: .3 mg via INTRAVENOUS

## 2015-07-01 MED ORDER — DIBUCAINE 1 % RE OINT: 1.0000 "application " | TOPICAL_OINTMENT | RECTAL | Status: DC | PRN

## 2015-07-01 MED ORDER — KETOROLAC TROMETHAMINE 30 MG/ML IJ SOLN: 30.0000 mg | Freq: Four times a day (QID) | INTRAMUSCULAR | Status: AC | PRN

## 2015-07-01 MED ORDER — SOD CITRATE-CITRIC ACID 500-334 MG/5ML PO SOLN
ORAL | Status: AC
  Administered 2015-07-01: 30 mL
  Filled 2015-07-01: qty 15

## 2015-07-01 MED ORDER — ONDANSETRON HCL 4 MG/2ML IJ SOLN
INTRAMUSCULAR | Status: AC
  Filled 2015-07-01: qty 2

## 2015-07-01 MED ORDER — OXYTOCIN 10 UNIT/ML IJ SOLN
INTRAMUSCULAR | Status: AC
  Filled 2015-07-01: qty 4

## 2015-07-01 MED ORDER — PHENYLEPHRINE 8 MG IN D5W 100 ML (0.08MG/ML) PREMIX OPTIME
INJECTION | INTRAVENOUS | Status: AC
  Filled 2015-07-01: qty 100

## 2015-07-01 MED ORDER — DIPHENHYDRAMINE HCL 25 MG PO CAPS
25.0000 mg | ORAL_CAPSULE | ORAL | Status: DC | PRN
  Filled 2015-07-01: qty 1

## 2015-07-01 MED ORDER — COCONUT OIL OIL: 1.0000 "application " | TOPICAL_OIL | Status: DC | PRN

## 2015-07-01 MED ORDER — ZOLPIDEM TARTRATE 5 MG PO TABS
5.0000 mg | ORAL_TABLET | Freq: Every evening | ORAL | Status: DC | PRN
  Administered 2015-07-02 – 2015-07-03 (×2): 5 mg via ORAL
  Filled 2015-07-01 (×2): qty 1

## 2015-07-01 MED ORDER — ONDANSETRON HCL 4 MG/2ML IJ SOLN
INTRAMUSCULAR | Status: DC | PRN
  Administered 2015-07-01: 4 mg via INTRAVENOUS

## 2015-07-01 MED ORDER — MORPHINE SULFATE (PF) 0.5 MG/ML IJ SOLN
INTRAMUSCULAR | Status: AC
  Filled 2015-07-01: qty 10

## 2015-07-01 MED ORDER — CEFAZOLIN SODIUM-DEXTROSE 2-4 GM/100ML-% IV SOLN
INTRAVENOUS | Status: AC
  Filled 2015-07-01: qty 100

## 2015-07-01 MED ORDER — ACETAMINOPHEN 325 MG PO TABS: 650.0000 mg | ORAL_TABLET | ORAL | Status: DC | PRN

## 2015-07-01 MED ORDER — NALOXONE HCL 0.4 MG/ML IJ SOLN: 0.4000 mg | INTRAMUSCULAR | Status: DC | PRN

## 2015-07-01 MED ORDER — LACTATED RINGERS IV SOLN
125.0000 mL/h | INTRAVENOUS | Status: DC
  Administered 2015-07-01: 125 mL/h via INTRAVENOUS

## 2015-07-01 MED ORDER — SIMETHICONE 80 MG PO CHEW
80.0000 mg | CHEWABLE_TABLET | ORAL | Status: DC
  Administered 2015-07-01 – 2015-07-03 (×3): 80 mg via ORAL
  Filled 2015-07-01 (×3): qty 1

## 2015-07-01 MED ORDER — BUPIVACAINE HCL (PF) 0.25 % IJ SOLN
INTRAMUSCULAR | Status: AC
  Filled 2015-07-01: qty 30

## 2015-07-01 MED ORDER — SCOPOLAMINE 1 MG/3DAYS TD PT72
MEDICATED_PATCH | TRANSDERMAL | Status: AC
  Filled 2015-07-01: qty 1

## 2015-07-01 MED ORDER — SIMETHICONE 80 MG PO CHEW
80.0000 mg | CHEWABLE_TABLET | Freq: Three times a day (TID) | ORAL | Status: DC
  Administered 2015-07-02 – 2015-07-04 (×7): 80 mg via ORAL
  Filled 2015-07-01 (×8): qty 1

## 2015-07-01 MED ORDER — MEPERIDINE HCL 25 MG/ML IJ SOLN: 6.2500 mg | INTRAMUSCULAR | Status: DC | PRN

## 2015-07-01 MED ORDER — MENTHOL 3 MG MT LOZG: 1.0000 | LOZENGE | OROMUCOSAL | Status: DC | PRN

## 2015-07-01 MED ORDER — DIPHENHYDRAMINE HCL 25 MG PO CAPS
25.0000 mg | ORAL_CAPSULE | Freq: Four times a day (QID) | ORAL | Status: DC | PRN
  Filled 2015-07-01: qty 1

## 2015-07-01 SURGICAL SUPPLY — 29 items
BENZOIN TINCTURE PRP APPL 2/3 (GAUZE/BANDAGES/DRESSINGS) ×3 IMPLANT
CLAMP CORD UMBIL (MISCELLANEOUS) IMPLANT
CLOSURE WOUND 1/2 X4 (GAUZE/BANDAGES/DRESSINGS) ×1
CLOTH BEACON ORANGE TIMEOUT ST (SAFETY) ×3 IMPLANT
DRSG OPSITE POSTOP 4X10 (GAUZE/BANDAGES/DRESSINGS) ×3 IMPLANT
DURAPREP 26ML APPLICATOR (WOUND CARE) ×3 IMPLANT
ELECT REM PT RETURN 9FT ADLT (ELECTROSURGICAL) ×3
ELECTRODE REM PT RTRN 9FT ADLT (ELECTROSURGICAL) ×1 IMPLANT
EXTRACTOR VACUUM M CUP 4 TUBE (SUCTIONS) IMPLANT
EXTRACTOR VACUUM M CUP 4' TUBE (SUCTIONS)
GLOVE BIOGEL PI IND STRL 7.0 (GLOVE) ×2 IMPLANT
GLOVE BIOGEL PI INDICATOR 7.0 (GLOVE) ×4
GLOVE ECLIPSE 7.0 STRL STRAW (GLOVE) ×6 IMPLANT
GOWN STRL REUS W/TWL LRG LVL3 (GOWN DISPOSABLE) ×6 IMPLANT
KIT ABG SYR 3ML LUER SLIP (SYRINGE) IMPLANT
NEEDLE HYPO 22GX1.5 SAFETY (NEEDLE) ×3 IMPLANT
NEEDLE HYPO 25X5/8 SAFETYGLIDE (NEEDLE) IMPLANT
NS IRRIG 1000ML POUR BTL (IV SOLUTION) ×3 IMPLANT
PACK C SECTION WH (CUSTOM PROCEDURE TRAY) ×3 IMPLANT
PAD OB MATERNITY 4.3X12.25 (PERSONAL CARE ITEMS) ×3 IMPLANT
PENCIL SMOKE EVAC W/HOLSTER (ELECTROSURGICAL) ×3 IMPLANT
RTRCTR C-SECT PINK 25CM LRG (MISCELLANEOUS) ×3 IMPLANT
STRIP CLOSURE SKIN 1/2X4 (GAUZE/BANDAGES/DRESSINGS) ×2 IMPLANT
SUT VIC AB 0 CTX 36 (SUTURE) ×6
SUT VIC AB 0 CTX36XBRD ANBCTRL (SUTURE) ×3 IMPLANT
SUT VIC AB 4-0 KS 27 (SUTURE) ×3 IMPLANT
SYR 30ML LL (SYRINGE) ×3 IMPLANT
TOWEL OR 17X24 6PK STRL BLUE (TOWEL DISPOSABLE) ×3 IMPLANT
TRAY FOLEY CATH SILVER 14FR (SET/KITS/TRAYS/PACK) ×3 IMPLANT

## 2015-07-01 NOTE — Transfer of Care (Signed)
Immediate Anesthesia Transfer of Care Note  Patient: Alicia Pearson  Procedure(s) Performed: Procedure(s): CESAREAN SECTION (N/A)  Patient Location: PACU  Anesthesia Type:Spinal  Level of Consciousness: awake and alert   Airway & Oxygen Therapy: Patient Spontanous Breathing  Post-op Assessment: Report given to RN and Post -op Vital signs reviewed and stable  Post vital signs: Reviewed and stable  Last Vitals:  Filed Vitals:   07/01/15 0755 07/01/15 0800  BP:    Pulse: 109 104  Temp:    Resp:      Last Pain:  Filed Vitals:   07/01/15 0812  PainSc: Asleep      Patients Stated Pain Goal: 4 (06/28/15 0835)  Complications: No apparent anesthesia complications

## 2015-07-01 NOTE — Anesthesia Postprocedure Evaluation (Signed)
Anesthesia Post Note  Patient: Alicia Pearson  Procedure(s) Performed: Procedure(s) (LRB): CESAREAN SECTION (N/A)  Patient location during evaluation: PACU Anesthesia Type: Spinal and MAC Level of consciousness: awake and alert Pain management: pain level controlled Vital Signs Assessment: post-procedure vital signs reviewed and stable Respiratory status: spontaneous breathing and respiratory function stable Cardiovascular status: blood pressure returned to baseline and stable Postop Assessment: spinal receding Anesthetic complications: no     Last Vitals:  Filed Vitals:   07/01/15 1045 07/01/15 1100  BP: 103/64 102/63  Pulse:    Temp:    Resp: 18 21    Last Pain:  Filed Vitals:   07/01/15 1105  PainSc: Asleep   Pain Goal: Patients Stated Pain Goal: 4 (06/28/15 0835)               Kennieth RadFitzgerald, Shellby Schlink E

## 2015-07-01 NOTE — Op Note (Signed)
Norton Blizzardominique A Nunnelley PROCEDURE DATE: 06/26/2015 - 07/01/2015  PREOPERATIVE DIAGNOSIS: Intrauterine pregnancy at  4981w4d weeks gestation; preterm labor, malpresentation Baby A  POSTOPERATIVE DIAGNOSIS: The same  PROCEDURE: Primary Low Transverse Cesarean Section  SURGEON:  Dr. Candelaria CelesteJacob Tahlia Deamer  ASSISTANT: Dr Tinnie Gensanya Pratt  INDICATIONS: Norton BlizzardDominique A Lagunes is a 30 y.o. [id number] at 2081w4d scheduled for cesarean section secondary to preterm labor, malpresentation Baby A.  The risks of cesarean section discussed with the patient included but were not limited to: bleeding which may require transfusion or reoperation; infection which may require antibiotics; injury to bowel, bladder, ureters or other surrounding organs; injury to the fetus; need for additional procedures including hysterectomy in the event of a life-threatening hemorrhage; placental abnormalities wth subsequent pregnancies, incisional problems, thromboembolic phenomenon and other postoperative/anesthesia complications. The patient concurred with the proposed plan, giving informed written consent for the procedure.    FINDINGS:  Viable female infant (baby A) in breech presentation.  Apgars pending, weight: pending.  Viable female infant (baby B) in vertex presentation.  Apgars pending, weight: 1 pounds and 15 ounces.  Clear amniotic fluid.  Intact placenta, three vessel cord.  Normal uterus, fallopian tubes and ovaries bilaterally.  ANESTHESIA:    Spinal INTRAVENOUS FLUIDS:1000 ml ESTIMATED BLOOD LOSS: 700 ml URINE OUTPUT:  1000 ml SPECIMENS: Placenta sent to pathology COMPLICATIONS: None immediate  PROCEDURE IN DETAIL:  The patient received intravenous antibiotics and had sequential compression devices applied to her lower extremities while in the preoperative area.  She was then taken to the operating room where spinal anesthesia was administered and was found to be adequate. She was then placed in a dorsal supine position with a leftward  tilt, and prepped and draped in a sterile manner.  A foley catheter was placed into her bladder and attached to constant gravity, which drained clear fluid throughout.  After an adequate timeout was performed, a Pfannenstiel skin incision was made with scalpel and carried through to the underlying layer of fascia. The fascia was incised in the midline and this incision was extended bilaterally using the Mayo scissors. Kocher clamps were applied to the superior aspect of the fascial incision and the underlying rectus muscles were dissected off bluntly. A similar process was carried out on the inferior aspect of the facial incision. The rectus muscles were separated in the midline bluntly and the peritoneum was entered bluntly. An Alexis retractor was placed to aid in visualization of the uterus.  Attention was turned to the lower uterine segment where a transverse hysterotomy was made with a scalpel and extended bilaterally bluntly. The infant was successfully delivered.  Delayed cord clamping was done for 1 minute, during which time the baby was active and crying.  The cord was clamped and cut and infant was handed over to awaiting neonatology team. Baby B was then successfully delivered.  Delayed cord clamping was done once again for 1 minute, during which time the baby was active and crying.  Cord B was double clamped on the placental side for pathology.  Uterine massage was then administered and the placenta delivered intact with three-vessel cord. The uterus was then cleared of clot and debris.  The hysterotomy was closed with 0 Vicryl in a running locked fashion, and an imbricating layer was also placed with a 0 Vicryl. Overall, excellent hemostasis was noted. The abdomen and the pelvis were cleared of all clot and debris and the Jon Gillslexis was removed. Hemostasis was confirmed on all surfaces.  The peritoneum was reapproximated using  2-0 vicryl running stitches. The fascia was then closed using 0 Vicryl in a  running fashion.  The subcutaneous layer was reapproximated with plain gut and the skin was closed with 4-0 vicryl. The patient tolerated the procedure well. Sponge, lap, instrument and needle counts were correct x 2. She was taken to the recovery room in stable condition.    Levie Heritage, DO 07/01/2015 9:00 AM

## 2015-07-01 NOTE — Progress Notes (Addendum)
Patient ID: Alicia Pearson, female   DOB: [birth year], 30 y.o.   MRN: [id number] FACULTY PRACTICE ANTEPARTUM(COMPREHENSIVE) NOTE  Alicia Pearson is a 30 y.o. [id number] at 5291w3d by best clinical estimate who is admitted for Preterm labor.   Fetal presentation is breech and vtx. Length of Stay:  5  Days  Subjective: Had bloody show this AM with some cramps.  Resolved with procardia. Patient reports the fetal movement as active. Patient reports uterine contraction  activity as none. Patient reports  vaginal bleeding as none. Patient describes fluid per vagina as pink tinged, lighter than previous.  Vitals:  Blood pressure 105/57, pulse 77, temperature 98.7 F (37.1 C), temperature source Oral, resp. rate 18, height 5\' 3"  (1.6 m), weight 208 lb (94.348 kg), last menstrual period 12/28/2014, SpO2 98 %, not currently breastfeeding. Physical Examination:  General appearance - alert, well appearing, and in no distress Chest - normal effort Abdomen - gravid, NT Fundal Height:  size equals dates Extremities: extremities normal, atraumatic, no cyanosis or edema  Membranes:intact  Dilation: 10 Dilation Complete Date: 07/01/15 Dilation Complete Time: 0710 Effacement (%): 70 Cervical Position: Anterior Station: -2 Presentation: Undeterminable Exam by:: dr. Adrian Blackwaterstinson  Palpable parts in vagina.   Fetal Monitoring:A  Baseline: 140 bpm, Variability: Good {> 6 bpm), Accelerations: Non-reactive but appropriate for gestational age and Decelerations: Absent  B Baseline: 135 bpm, Variability: Good {> 6 bpm), Accelerations: Non-reactive but appropriate for gestational age and Decelerations: Absent   Medications:  Scheduled . docusate sodium  100 mg Oral Daily  . magnesium  4 g Intravenous Once  . NIFEdipine  30 mg Oral BID  . pantoprazole  40 mg Oral Daily  . prenatal multivitamin  1 tablet Oral Q1200  . progesterone  200 mg Vaginal Daily  . valACYclovir  500 mg Oral Daily   I have  reviewed the patient's current medications.  ASSESSMENT: Principal Problem:   Preterm labor Active Problems:   Tobacco abuse   Depression   Herpes infection during pregnancy, antepartum   Grand multipara   Obesity in pregnancy, antepartum   Supervision of high risk pregnancy, antepartum   Dichorionic diamniotic twin pregnancy   PLAN: Bedside US done - baby A breech.  Will prepare pt for OR  The risks of cesarean section discussed with the patient included but were not limited to: bleeding which may require transfusion or reoperation; infection which may require antibiotics; injury to bowel, bladder, ureters or other surrounding organs; injury to the fetus; need for additional procedures including hysterectomy in the event of a life-threatening hemorrhage; placental abnormalities wth subsequent pregnancies, incisional problems, thromboembolic phenomenon and other postoperative/anesthesia complications. The patient concurred with the proposed plan, giving informed written consent for the procedure.   Patient has been NPO since last night she will remain NPO for procedure. Anesthesia and OR aware.  Preoperative prophylactic Ancef ordered on call to the OR.  To OR when ready.  Levie HeritageJacob J Stinson, DO 07/01/2015 7:24 AM    STINSON, JACOB JEHIEL, DO 07/01/2015,7:21 AM

## 2015-07-01 NOTE — Anesthesia Postprocedure Evaluation (Signed)
Anesthesia Post Note  Patient: Norton BlizzardDominique A Mennen  Procedure(s) Performed: Procedure(s) (LRB): CESAREAN SECTION (N/A)  Patient location during evaluation: [facility] Anesthesia Type: Spinal Level of consciousness: oriented and awake and alert Pain management: pain level controlled Vital Signs Assessment: post-procedure vital signs reviewed and stable Respiratory status: spontaneous breathing, respiratory function stable and patient connected to nasal cannula oxygen Cardiovascular status: blood pressure returned to baseline and stable Postop Assessment: no headache and no backache Anesthetic complications: no     Last Vitals:  Filed Vitals:   07/01/15 1246 07/01/15 1701  BP: 93/53 106/67  Pulse: 63 63  Temp: 36.6 C 36.8 C  Resp: 20 20    Last Pain:  Filed Vitals:   07/01/15 1735  PainSc: 1    Pain Goal: Patients Stated Pain Goal: 2 (07/01/15 1130)               Rica RecordsICKELTON,Martavis Gurney

## 2015-07-01 NOTE — Addendum Note (Signed)
Addendum  created 07/01/15 1849 by Rica RecordsAngela Burk Hoctor, CRNA   Modules edited: Clinical Notes   Clinical Notes:  File: [id number]

## 2015-07-01 NOTE — Anesthesia Procedure Notes (Signed)
Epidural Patient location during procedure: OB  Staffing Anesthesiologist: Marcene DuosFITZGERALD, Terelle Dobler Performed by: anesthesiologist   Preanesthetic Checklist Completed: patient identified, site marked, surgical consent, pre-op evaluation, timeout performed, IV checked, risks and benefits discussed and monitors and equipment checked  Epidural Patient position: sitting Prep: site prepped and draped and DuraPrep Patient monitoring: continuous pulse ox and blood pressure Approach: midline Location: L4-L5 Injection technique: LOR air  Needle:  Needle type: Tuohy  Needle gauge: 17 G Needle length: 9 cm and 9 Needle insertion depth: 8 cm Catheter type: closed end flexible Catheter size: 19 Gauge Catheter at skin depth: 13 cm Test dose: negative  Assessment Events: blood not aspirated, injection not painful, no injection resistance, negative IV test and no paresthesia  Additional Notes Combined spinal and epidural performed due to twin gestation and grand multiparity. LOR to air at 8cm. Easy pass of 25G pencan through introducer. CSF aspirated before and after injection LA. Catheter threaded easily and secured to back at 13cm. T6 levels noted.

## 2015-07-01 NOTE — Anesthesia Preprocedure Evaluation (Addendum)
Anesthesia Evaluation  Patient identified by MRN, date of birth, ID band Patient awake    Reviewed: Allergy & Precautions, NPO status , Patient's Chart, lab work & pertinent test results  Airway Mallampati: II  TM Distance: >3 FB Neck ROM: Full    Dental   Pulmonary Current Smoker,    breath sounds clear to auscultation       Cardiovascular negative cardio ROS   Rhythm:Regular Rate:Normal     Neuro/Psych Anxiety Depression negative neurological ROS     GI/Hepatic negative GI ROS, Neg liver ROS,   Endo/Other  negative endocrine ROS  Renal/GU negative Renal ROS     Musculoskeletal   Abdominal   Peds  Hematology negative hematology ROS (+)   Anesthesia Other Findings   Reproductive/Obstetrics (+) Pregnancy                           Lab Results  Component Value Date   WBC 11.5* 06/30/2015   HGB 11.3* 06/30/2015   HCT 33.1* 06/30/2015   MCV 93.8 06/30/2015   PLT 208 06/30/2015    Anesthesia Physical Anesthesia Plan  ASA: III and emergent  Anesthesia Plan: Combined Spinal and Epidural   Post-op Pain Management:    Induction:   Airway Management Planned: Natural Airway  Additional Equipment:   Intra-op Plan:   Post-operative Plan:   Informed Consent: I have reviewed the patients History and Physical, chart, labs and discussed the procedure including the risks, benefits and alternatives for the proposed anesthesia with the patient or authorized representative who has indicated his/her understanding and acceptance.     Plan Discussed with: CRNA  Anesthesia Plan Comments: (Late entry note: Emergent c-section for pre-term twin gestation with twin A breech with presenting parts in the vagina. Plan for CSE due to grand multiparity and twin gestation. Type and screen sent. Emergency release available.)      Anesthesia Quick Evaluation

## 2015-07-02 ENCOUNTER — Encounter (HOSPITAL_COMMUNITY): Payer: Self-pay | Admitting: Family Medicine

## 2015-07-02 LAB — CBC
HCT: 29.6 % — ABNORMAL LOW (ref 36.0–46.0)
Hemoglobin: 10 g/dL — ABNORMAL LOW (ref 12.0–15.0)
MCH: 31.8 pg (ref 26.0–34.0)
MCHC: 33.8 g/dL (ref 30.0–36.0)
MCV: 94.3 fL (ref 78.0–100.0)
Platelets: 200 10*3/uL (ref 150–400)
RBC: 3.14 MIL/uL — ABNORMAL LOW (ref 3.87–5.11)
RDW: 14.5 % (ref 11.5–15.5)
WBC: 13.7 10*3/uL — ABNORMAL HIGH (ref 4.0–10.5)

## 2015-07-02 MED ORDER — NAPROXEN 250 MG PO TABS
500.0000 mg | ORAL_TABLET | Freq: Two times a day (BID) | ORAL | Status: DC
  Administered 2015-07-02 – 2015-07-04 (×4): 500 mg via ORAL
  Filled 2015-07-02 (×6): qty 2
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
## 2015-07-02 NOTE — Lactation Note (Signed)
This note was copied from a baby'Pearson chart. Lactation Consultation Note  Initial assessment completed.  27.4 week twins delivered by C/Pearson yesterday.  Mom has initiated pumping and denies questions.  Reminded to pump every 2-3 hours during the day and once at night.  Mom has Providing Breastmilk For Your Baby in NICU. Mom has WIC and desires a pump referral to be faxed.  [facility]  Encouraged to call with questions or assist prn.   Patient Name: Alicia Pearson Today'Pearson Date: 07/02/2015 Reason for consult: Initial assessment;NICU baby;Multiple gestation   Maternal Data    Feeding    LATCH Score/Interventions                      Lactation Tools Discussed/Used WIC Program: Yes Pump Review: Setup, frequency, and cleaning;Milk Storage Initiated by:: RN Date initiated:: 07/01/15   Consult Status Consult Status: Follow-up Date: 07/03/15 Follow-up type: In-patient    [name], Alicia Pearson 07/02/2015, 10:48 AM

## 2015-07-02 NOTE — Progress Notes (Signed)
Subjective: Postpartum Day 1: Cesarean Delivery Patient reports incisional pain, tolerating PO and + flatus.    Objective: Vital signs in last 24 hours: Temp:  [97.7 F (36.5 C)-98.4 F (36.9 C)] 98.4 F (36.9 C) (05/29 0518) Pulse Rate:  [63-110] 63 (05/29 0518) Resp:  [16-25] 18 (05/29 0518) BP: (90-114)/(50-70) 90/50 mmHg (05/29 0518) SpO2:  [96 %-100 %] 98 % (05/29 0518)  Physical Exam:  General: alert, cooperative, appears stated age and no distress Lochia: appropriate Uterine Fundus: firm Incision: healing well, no significant drainage, no dehiscence, no significant erythema DVT Evaluation: Negative Homan's sign. No cords or calf tenderness. No significant calf/ankle edema.   Recent Labs  07/01/15 0730 07/02/15 0512  HGB 12.4 10.0*  HCT 35.6* 29.6*    Assessment/Plan: Status post Cesarean section. Doing well postoperatively.  Continue current care.  Alicia Pearson 07/02/2015, 7:16 AM

## 2015-07-03 NOTE — Progress Notes (Signed)
Alicia Pearson appears to be coping well with having her twins in the NICU. She spoke about the experience of their birth.  Although she has 6 other children, this is her first experience with a C/S. She is very active and she stated that walking has been helpful.  She talked about this hospitalization as a bit of a break from her very busy life.    I asked her about self-care in the midst of caring for her family and she stated that she does not get much time for herself, but that it doesn't get her down.    I offered prayer at her request as well as spiritual companionship and listening.  Chaplain Dyanne CarrelKaty Trease [place], Bcc Pager, [phone] 4:21 PM    07/03/15 1600  Clinical Encounter Type  Visited With Patient  Visit Type Spiritual support  Referral From Chaplain;Nurse

## 2015-07-03 NOTE — Lactation Note (Signed)
This note was copied from a baby's chart. Lactation Consultation Note  Patient Name: Marzetta BoardBoyA Sharlett Vandewater AOZHY'QToday's Date: 07/03/2015 Reason for consult: Follow-up assessment;NICU baby;Infant < 6lbs;Late preterm infant   Follow up with mom at RN's request. Mom reports she is feeling very full, mom's breast noted to be full and engorged. Mom reports she is pumping every " 4 hours or so". Mom recently pumped and did not get any milk out, RN was hand expressing breast and obtaining EBM. Ice packs placed to both breasts to stay for 20 minutes while mom eating lunch. Instructed mom to ice and pump every 2 hours and then follow with hand expression to relieve engorgement. Mom voiced understanding. Follow up tomorrow and prn.   Maternal Data    Feeding    LATCH Score/Interventions                      Lactation Tools Discussed/Used Pump Review: Setup, frequency, and cleaning;Milk Storage   Consult Status Consult Status: Follow-up Date: 07/04/15 Follow-up type: In-patient    Silas FloodSharon S Hice 07/03/2015, 3:59 PM

## 2015-07-03 NOTE — Lactation Note (Signed)
This note was copied from a baby's chart. Lactation Consultation Note  Patient Name: Alicia Pearson RUEAV'WToday's Date: 07/03/2015 Reason for consult: Follow-up assessment;NICU baby;Infant < 6lbs;Multiple gestation   Follow up with mom of 5149 hour old of NICU Twins. Mom reports she is pumping every 4 hours or so and doing hand expression. She reports she is not seeing colostrum yet but breasts are feeling fuller today. Enc mom to pump every 2-3 hours for 15 minutes with DEBP followed by hand expression. Mom is planning to ask to hold infants STS when she visits in NICU.   Mom does not have a pump at home and [facility] Mom is planning to be d/c home tomorrow. Reviewed with mom how to double pump with manual pump she has and [facility]. Mom to let us know if she wants to rent a DEBP at d/c. Reviewed that a DEBP is recommended in light of preterm twins in NICU, mom voiced understanding.   Follow up tomorrow and prn   Maternal Data Has patient been taught Hand Expression?: Yes Does the patient have breastfeeding experience prior to this delivery?: Yes  Feeding    LATCH Score/Interventions                      Lactation Tools Discussed/Used WIC Program: Yes Pump Review: Setup, frequency, and cleaning;Milk Storage   Consult Status Consult Status: Follow-up Date: 07/04/15 Follow-up type: In-patient    Alicia FloodSharon S Casimiro Pearson 07/03/2015, 10:20 AM

## 2015-07-03 NOTE — Progress Notes (Signed)
Subjective: Postpartum Day 1: Cesarean Delivery Patient reports tolerating PO and no problems voiding.  No complaints  Objective: Vital signs in last 24 hours: Temp:  [97.9 F (36.6 C)-98.9 F (37.2 C)] 97.9 F (36.6 C) (05/30 0507) Pulse Rate:  [63-82] 73 (05/30 0507) Resp:  [18] 18 (05/30 0507) BP: (96-105)/(51-76) 99/56 mmHg (05/30 0507) SpO2:  [98 %-100 %] 100 % (05/30 0507)  Physical Exam:  General: alert, cooperative and no distress Lochia: appropriate Uterine Fundus: firm Incision: healing well, no significant drainage, no dehiscence DVT Evaluation: No evidence of DVT seen on physical exam. Negative Homan's sign. No cords or calf tenderness.   Recent Labs  07/01/15 0730 07/02/15 0512  HGB 12.4 10.0*  HCT 35.6* 29.6*    Assessment/Plan: Status post Cesarean section. Doing well postoperatively.  Discharge tomorrow.  Lactation support.  STINSON, JACOB JEHIEL 07/03/2015, 7:35 AM

## 2015-07-03 NOTE — Lactation Note (Signed)
This note was copied from a baby's chart. Lactation Consultation Note  Patient Name: Alicia Pearson Reason for consult: Follow-up assessment;NICU baby;Infant < 6lbs;Late preterm infant   Saw mom in NICU coming to visit infants. Mom reports she did not pump. Reviewed engorgement with her and need to empty breast every 2 hours after icing to preserve milk supply, mom voiced understanding.   Maternal Data    Feeding    LATCH Score/Interventions                      Lactation Tools Discussed/Used Pump Review: Setup, frequency, and cleaning;Milk Storage   Consult Status Consult Status: Follow-up Date: 07/04/15 Follow-up type: In-patient    Alicia Pearson Pearson, 6:23 PM

## 2015-07-04 LAB — RPR: RPR Ser Ql: NONREACTIVE

## 2015-07-04 MED ORDER — ACETAMINOPHEN 325 MG PO TABS: 650.0000 mg | ORAL_TABLET | ORAL | Status: DC | PRN

## 2015-07-04 MED ORDER — SENNOSIDES-DOCUSATE SODIUM 8.6-50 MG PO TABS: 2.0000 | ORAL_TABLET | Freq: Every evening | ORAL | Status: DC | PRN

## 2015-07-04 MED ORDER — OXYCODONE-ACETAMINOPHEN 5-325 MG PO TABS: 1.0000 | ORAL_TABLET | Freq: Four times a day (QID) | ORAL | Status: DC | PRN

## 2015-07-04 NOTE — Lactation Note (Signed)
This note was copied from a baby's chart. Lactation Consultation Note  Follow up visit made prior to mother's discharge.  Mom states she has been mostly hand expressing and using manual pump because she obtains more than electric pump.  Breasts are firm and engorged this AM.  Discussed engorgement treatment.  Ice packs provided for patient.  Reviewed importance of decreasing swelling by using ice and antiinflammatory med prescribed by MD.  Mom states she has appointment tomorrow to obtain breast pump.  Recommended she call this AM to obtain pump today.  Instructed to pump every 2-3 hours massaging breasts before and during pumping.  Reviewed benefits of babies receiving breast milk.  Encouraged to call with concerns prn.  Patient Name: Marzetta BoardBoyA Ariyannah Costabile BMWUX'LToday's Date: 07/04/2015     Maternal Data    Feeding    LATCH Score/Interventions                      Lactation Tools Discussed/Used     Consult Status      [name], Valree Feild S 07/04/2015, 9:57 AM

## 2015-07-04 NOTE — Progress Notes (Signed)
Pts d/c instructions reviewed at 0800. Pt verbalizes understanding of d/c instructions,medications, follow up appts, when to seek medical attention, belongings policy, and restrictions. Pt has no questions at this time. Pt has no IV at time of d/c. Pt left the unit and was in NICU for several hours. Pt returned to unit and collected her things and left with family who will be driving her home. Sheryn BisonGordon, Hanaa Payes Warner

## 2015-07-04 NOTE — Discharge Instructions (Signed)
Cesarean Delivery, Care After  Refer to this sheet in the next few weeks. These instructions provide you with information on caring for yourself after your procedure. Your health care provider may also give you specific instructions. Your treatment has been planned according to current medical practices, but problems sometimes occur. Call your health care provider if you have any problems or questions after you go home.  HOME CARE INSTRUCTIONS   Only take over-the-counter or prescription medications as directed by your health care provider.   Do not drink alcohol, especially if you are breastfeeding or taking medication to relieve pain.   Do not chew or smoke tobacco.   Continue to use good perineal care. Good perineal care includes:    Wiping your perineum from front to back.    Keeping your perineum clean.   Check your surgical cut (incision) daily for increased redness, drainage, swelling, or separation of skin.   Clean your incision gently with soap and water every day, and then pat it dry. If your health care provider says it is okay, leave the incision uncovered. Use a bandage (dressing) if the incision is draining fluid or appears irritated. If the adhesive strips across the incision do not fall off within 7 days, carefully peel them off.   Hug a pillow when coughing or sneezing until your incision is healed. This helps to relieve pain.   Do not use tampons or douche until your health care provider says it is okay.   Shower, wash your hair, and take tub baths as directed by your health care provider.   Wear a well-fitting bra that provides breast support.   Limit wearing support panties or control-top hose.   Drink enough fluids to keep your urine clear or pale yellow.   Eat high-fiber foods such as whole grain cereals and breads, brown rice, beans, and fresh fruits and vegetables every day. These foods may help prevent or relieve constipation.   Resume activities such as climbing stairs,  driving, lifting, exercising, or traveling as directed by your health care provider.   Talk to your health care provider about resuming sexual activities. This is dependent upon your risk of infection, your rate of healing, and your comfort and desire to resume sexual activity.   Try to have someone help you with your household activities and your newborn for at least a few days after you leave the hospital.   Rest as much as possible. Try to rest or take a nap when your newborn is sleeping.   Increase your activities gradually.   Keep all of your scheduled postpartum appointments. It is very important to keep your scheduled follow-up appointments. At these appointments, your health care provider will be checking to make sure that you are healing physically and emotionally.  SEEK MEDICAL CARE IF:    You are passing large clots from your vagina. Save any clots to show your health care provider.   You have a foul smelling discharge from your vagina.   You have trouble urinating.   You are urinating frequently.   You have pain when you urinate.   You have a change in your bowel movements.   You have increasing redness, pain, or swelling near your incision.   You have pus draining from your incision.   Your incision is separating.   You have painful, hard, or reddened breasts.   You have a severe headache.   You have blurred vision or see spots.   You feel sad   or depressed.   You have thoughts of hurting yourself or your newborn.   You have questions about your care, the care of your newborn, or medications.   You are dizzy or light-headed.   You have a rash.   You have pain, redness, or swelling at the site of the removed intravenous access (IV) tube.   You have nausea or vomiting.   You stopped breastfeeding and have not had a menstrual period within 12 weeks of stopping.   You are not breastfeeding and have not had a menstrual period within 12 weeks of delivery.   You have a fever.  SEEK  IMMEDIATE MEDICAL CARE IF:   You have persistent pain.   You have chest pain.   You have shortness of breath.   You faint.   You have leg pain.   You have stomach pain.   Your vaginal bleeding saturates 2 or more sanitary pads in 1 hour.  MAKE SURE YOU:    Understand these instructions.   Will watch your condition.   Will get help right away if you are not doing well or get worse.     This information is not intended to replace advice given to you by your health care provider. Make sure you discuss any questions you have with your health care provider.     Document Released: 10/12/2001 Document Revised: 02/10/2014 Document Reviewed: 09/17/2011  Elsevier Interactive Patient Education 2016 [employer]

## 2015-07-04 NOTE — Plan of Care (Signed)
Problem: Role Relationship: Goal: Ability to demonstrate positive interaction with newborn will improve Outcome: Completed/Met Date Met:  07/04/15 Pt has been frequently visiting infants in nicu.

## 2015-07-04 NOTE — Discharge Summary (Signed)
OB Discharge Summary     Patient Name: Alicia Pearson DOB: 10/28/1985 MRN: [id number]  Date of admission: 06/26/2015 Delivering Pearson:    Alicia Pearson, BoyA Maela [[id number]][[id number]]  Marrian SalvageSTINSON, JACOB J   Hinote, GirlB Omah [[id number]][[id number]]  Candelaria CelesteSTINSON, JACOB J   Date of discharge: 07/04/2015  Admitting diagnosis: 27w baby feels low and feels like i need to push, ctx Intrauterine pregnancy: 6744w0d     Secondary diagnosis:  Principal Problem:   Preterm labor Active Problems:   Tobacco abuse   Depression   Herpes infection during pregnancy, antepartum   Grand multipara   Obesity in pregnancy, antepartum   Supervision of high risk pregnancy, antepartum   Dichorionic diamniotic twin pregnancy  Additional problems: none     Discharge diagnosis: Preterm Pregnancy Delivered                                                                                                Post partum procedures:none  Augmentation: none  Complications: None  Hospital course:  Onset of Labor With Unplanned C/S  30 y.o. yo [id number] at 2844w0d was admitted in preterm labor on 06/26/2015. She received tocolysis and two rescue doses betamethasone as well as neuroprotective magnesium. C/s 5/28 for complete dilation and breech/vertex presentation.     Alicia CollumLancaster, BoyA WellfleetDominique [[id number]][[id number]]  8:36 AM   Alicia BuddyLancaster, GirlB Wading RiverDominique [[id number]][[id number]]  8:37 AM ,   Alicia Pearson, BoyA [name] [[id number]][[id number]]  07/01/2015   Alicia Pearson, GirlB Myiah [[id number]][[id number]]  07/01/2015   The patient went for cesarean section due to preterm labor, advanced cervical dilation, and breech presentation, and delivered Viable infants,   Alicia Pearson, BoyA Alicia Pearson [[id number]][[id number]]  07/01/2015   Alicia Pearson, GirlB Alicia Pearson [[id number]][[id number]]  07/01/2015  Details of operation can be found in separate operative note. Patient had an uncomplicated postpartum course.  She is ambulating,tolerating a regular diet, passing flatus, and urinating well.  Patient is  discharged home in stable condition 07/04/2015.  Physical exam  Filed Vitals:   07/03/15 1149 07/03/15 1730 07/03/15 2158 07/04/15 0609  BP: 109/58 99/65 98/56  104/68  Pulse: 91 89 73 77  Temp: 98.9 F (37.2 C) 97.7 F (36.5 C) 99.1 F (37.3 C) 98.3 F (36.8 C)  TempSrc: Oral Oral Oral Oral  Resp: 18 18 18 16   Height:      Weight:      SpO2: 99% 100% 100% 99%   General: alert, cooperative and no distress Lochia: appropriate Uterine Fundus: firm Incision: No significant erythema, Dressing is clean, dry, and intact DVT Evaluation: No cords or calf tenderness. No significant calf/ankle edema. Labs: Lab Results  Component Value Date   WBC 13.7* 07/02/2015   HGB 10.0* 07/02/2015   HCT 29.6* 07/02/2015   MCV 94.3 07/02/2015   PLT 200 07/02/2015   CMP Latest Ref Rng 02/17/2015  Glucose 65 - 99 mg/dL 79  BUN 6 - 20 mg/dL 9  Creatinine 0.630.44 - 0.161.00 mg/dL 0.100.62  Sodium 932135 - 355145 mmol/L 135  Potassium 3.5 - 5.1 mmol/L 3.8  Chloride 101 - 111 mmol/L 106  CO2 22 - 32 mmol/L 22  Calcium 8.9 -  10.3 mg/dL 9.6(E)    Discharge instruction: per After Visit Summary and "Baby and Me Booklet".  After visit meds:    Medication List    STOP taking these medications        cyclobenzaprine 10 MG tablet  Commonly known as:  FLEXERIL     progesterone 200 MG capsule  Commonly known as:  PROMETRIUM      TAKE these medications        acetaminophen 325 MG tablet  Commonly known as:  TYLENOL  Take 2 tablets (650 mg total) by mouth every 4 (four) hours as needed (for pain scale < 4).     Butalbital-APAP-Caffeine 50-325-40 MG capsule  Take 1-2 capsules by mouth every 6 (six) hours as needed for headache.     oxyCODONE-acetaminophen 5-325 MG tablet  Commonly known as:  PERCOCET/ROXICET  Take 1 tablet by mouth every 6 (six) hours as needed (pain scale 4-7).     PREPLUS 27-1 MG Tabs  TAKE ONE TABLET DAILY     senna-docusate 8.6-50 MG tablet  Commonly known as:  Senokot-S  Take 2  tablets by mouth at bedtime as needed for mild constipation.        Diet: routine diet  Activity: Advance as tolerated. Pelvic rest for 6 weeks.   Outpatient follow up:6 weeks Follow up Appt:Future Appointments Date Time Provider Department Center  07/13/2015 9:00 AM [facility] [place] 3 WH-[facility]   Follow up Visit:No Follow-up on file.  Postpartum contraception: Nexplanon  Newborn Data:   Alicia, Pearson [[id number]]  Live born female  Birth Weight: 2 lb 5.4 oz (1060 g) APGAR: 9, 8   Alicia, Pearson [[id number]]  Live born female  Birth Weight: 1 lb 15 oz (879 g) APGAR: 7, 8  Baby Feeding: Bottle and Breast Disposition:NICU   07/04/2015 Alicia Bilis, Pearson

## 2015-07-05 LAB — TYPE AND SCREEN
ABO/RH(D): B POS
ANTIBODY SCREEN: POSITIVE
DAT, IGG: NEGATIVE
DONOR AG TYPE: NEGATIVE
Donor AG Type: NEGATIVE
Unit division: 0
Unit division: 0

## 2015-07-08 ENCOUNTER — Encounter (HOSPITAL_COMMUNITY): Payer: Self-pay | Admitting: *Deleted

## 2015-07-08 ENCOUNTER — Inpatient Hospital Stay (HOSPITAL_COMMUNITY)
Admission: AD | Admit: 2015-07-08 | Discharge: 2015-07-08 | Disposition: A | Discharge status: Home / Self Care | Payer: Medicaid Other | Source: Ambulatory Visit | Attending: Family Medicine | Admitting: Family Medicine

## 2015-07-08 DIAGNOSIS — T814XXA Infection following a procedure, initial encounter: Secondary | ICD-10-CM

## 2015-07-08 DIAGNOSIS — F17210 Nicotine dependence, cigarettes, uncomplicated: Secondary | ICD-10-CM | POA: Insufficient documentation

## 2015-07-08 DIAGNOSIS — O99335 Smoking (tobacco) complicating the puerperium: Secondary | ICD-10-CM | POA: Insufficient documentation

## 2015-07-08 DIAGNOSIS — IMO0001 Reserved for inherently not codable concepts without codable children: Secondary | ICD-10-CM

## 2015-07-08 DIAGNOSIS — O9089 Other complications of the puerperium, not elsewhere classified: Secondary | ICD-10-CM | POA: Diagnosis present

## 2015-07-08 DIAGNOSIS — O860 Infection of obstetric surgical wound: Secondary | ICD-10-CM | POA: Diagnosis not present

## 2015-07-08 MED ORDER — CLINDAMYCIN HCL 300 MG PO CAPS: 300.0000 mg | ORAL_CAPSULE | Freq: Four times a day (QID) | ORAL | Status: DC

## 2015-07-08 MED ORDER — CLINDAMYCIN HCL 300 MG PO CAPS
300.0000 mg | ORAL_CAPSULE | Freq: Once | ORAL | Status: AC
  Administered 2015-07-08: 300 mg via ORAL
  Filled 2015-07-08: qty 1

## 2015-07-08 NOTE — Discharge Instructions (Signed)
Wound Infection °A wound infection happens when a type of germ (bacteria) starts growing in the wound. In some cases, this can cause the wound to break open. If cared for properly, the infected wound will heal from the inside to the outside. Wound infections need treatment. °CAUSES °An infection is caused by bacteria growing in the wound.  °SYMPTOMS  °· Increase in redness, swelling, or pain at the wound site. °· Increase in drainage at the wound site. °· Wound or bandage (dressing) starts to smell bad. °· Fever. °· Feeling tired or fatigued. °· Pus draining from the wound. °TREATMENT  °Your health care provider will prescribe antibiotic medicine. The wound infection should improve within 24 to 48 hours. Any redness around the wound should stop spreading and the wound should be less painful.  °HOME CARE INSTRUCTIONS  °· Only take over-the-counter or prescription medicines for pain, discomfort, or fever as directed by your health care provider. °· Take your antibiotics as directed. Finish them even if you start to feel better. °· Gently wash the area with mild soap and water 2 times a day, or as directed. Rinse off the soap. Pat the area dry with a clean towel. Do not rub the wound. This may cause bleeding. °· Follow your health care provider's instructions for how often you need to change the dressing. °· Apply ointment and a dressing to the wound as directed. °· If the dressing sticks, moisten it with soapy water and gently remove it. °· Change the bandage right away if it becomes wet, dirty, or develops a bad smell. °· Take showers. Do not take tub baths, swim, or do anything that may soak the wound until it is healed. °· Avoid exercises that make you sweat heavily. °· Use anti-itch medicine as directed by your health care provider. The wound may itch when it is healing. Do not pick or scratch at the wound. °· Follow up with your health care provider to get your wound rechecked as directed. °SEEK MEDICAL CARE  IF: °· You have an increase in swelling, pain, or redness around the wound. °· You have an increase in the amount of pus coming from the wound. °· There is a bad smell coming from the wound. °· More of the wound breaks open. °· You have a fever. °MAKE SURE YOU:  °· Understand these instructions. °· Will watch your condition. °· Will get help right away if you are not doing well or get worse. °  °This information is not intended to replace advice given to you by your health care provider. Make sure you discuss any questions you have with your health care provider. °  °Document Released: 10/19/2002 Document Revised: 01/25/2013 Document Reviewed: 07/10/2014 °Elsevier Interactive Patient Education ©2016 [employer] ° °
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
## 2015-07-08 NOTE — MAU Note (Signed)
Incision clean and dry. R side has one area where edge of incision is exposed. Scant amt tan drainage on gauze dsg pt wore in. Area soft with slight redness above incision. Slight foul odor

## 2015-07-08 NOTE — MAU Note (Signed)
Primary C/S 5/28 at 28wks. Twins. Went to clean incision yesterday am and felt like may be open or was feeling an edge on R side. Slight odor to incision but no drainage. Vag bleeding has subsided

## 2015-07-08 NOTE — Progress Notes (Signed)
Written and verbal d/c instructions given and understanding voiced. 

## 2015-07-09 ENCOUNTER — Encounter (HOSPITAL_COMMUNITY): Payer: Self-pay | Admitting: *Deleted

## 2015-07-12 ENCOUNTER — Encounter: Payer: Self-pay | Admitting: Obstetrics & Gynecology

## 2015-07-12 ENCOUNTER — Encounter: Payer: Self-pay | Admitting: *Deleted

## 2015-07-12 ENCOUNTER — Ambulatory Visit (INDEPENDENT_AMBULATORY_CARE_PROVIDER_SITE_OTHER): Payer: Medicaid Other | Admitting: Obstetrics & Gynecology

## 2015-07-12 VITALS — BP 103/72 | HR 91 | Temp 98.5°F | Resp 16 | Ht 63.0 in | Wt 196.8 lb

## 2015-07-12 DIAGNOSIS — Z30013 Encounter for initial prescription of injectable contraceptive: Secondary | ICD-10-CM

## 2015-07-12 DIAGNOSIS — Z30017 Encounter for initial prescription of implantable subdermal contraceptive: Secondary | ICD-10-CM

## 2015-07-12 MED ORDER — ETONOGESTREL 68 MG ~~LOC~~ IMPL
68.0000 mg | DRUG_IMPLANT | Freq: Once | SUBCUTANEOUS | Status: AC
  Administered 2015-07-12: 68 mg via SUBCUTANEOUS

## 2015-07-12 NOTE — Addendum Note (Signed)
Addended by: Gita KudoLASSITER, Idalys Konecny S on: 07/12/2015 11:43 AM   Modules accepted: Orders

## 2015-07-12 NOTE — Progress Notes (Signed)
   Subjective:    Patient ID: Norton Blizzardominique A Inboden, female    DOB: 12-18-1985, 30 y.o.   MRN: [id number]  HPI Dondra PraderDominique is here for an incision check. She had a PLTCS at 27 weeks for PTL, twins, breech. She has no complaints today. She has definitely NOT had sex since delivery. The twins are doing well in the NICU. She wants the Nexplanon for contraception as she had weight gain with depo provera. She had irregular bleeding with depo and is aware that this is a possible side effect of Nexplanon.   Review of Systems     Objective:   Physical Exam [id number] Breathing, conversing, and ambulating normally Abd- benign Incision- healed great  Consent was signed. Time out procedure was done. Her left arm was prepped with betadine and infiltrated with 3 cc of 1% lidocaine. After adequate anesthesia was assured, the Nexplanon device was placed according to standard of care. Her arm was hemostatic and was bandaged. She tolerated the procedure well.        Assessment & Plan:  Contraception- Nexplanon Back up method for 2 weeks RTC 3 weeks for pp visit

## 2015-07-13 ENCOUNTER — Ambulatory Visit (HOSPITAL_COMMUNITY): Payer: Medicaid Other

## 2015-07-23 ENCOUNTER — Ambulatory Visit: Payer: Medicaid Other | Admitting: Obstetrics & Gynecology

## 2016-02-26 ENCOUNTER — Encounter: Payer: Self-pay | Admitting: Obstetrics & Gynecology

## 2016-02-26 ENCOUNTER — Ambulatory Visit (INDEPENDENT_AMBULATORY_CARE_PROVIDER_SITE_OTHER): Payer: Medicaid Other | Admitting: Obstetrics & Gynecology

## 2016-02-26 ENCOUNTER — Other Ambulatory Visit (HOSPITAL_COMMUNITY)
Admission: RE | Admit: 2016-02-26 | Discharge: 2016-02-26 | Disposition: A | Discharge status: Home / Self Care | Payer: Medicaid Other | Source: Ambulatory Visit | Attending: Obstetrics & Gynecology | Admitting: Obstetrics & Gynecology

## 2016-02-26 VITALS — BP 108/73 | HR 94 | Ht 63.0 in | Wt 194.0 lb

## 2016-02-26 DIAGNOSIS — Z01411 Encounter for gynecological examination (general) (routine) with abnormal findings: Secondary | ICD-10-CM | POA: Diagnosis present

## 2016-02-26 DIAGNOSIS — Z0000 Encounter for general adult medical examination without abnormal findings: Secondary | ICD-10-CM

## 2016-02-26 DIAGNOSIS — Z01419 Encounter for gynecological examination (general) (routine) without abnormal findings: Secondary | ICD-10-CM

## 2016-02-26 DIAGNOSIS — G43829 Menstrual migraine, not intractable, without status migrainosus: Secondary | ICD-10-CM

## 2016-02-26 DIAGNOSIS — Z1151 Encounter for screening for human papillomavirus (HPV): Secondary | ICD-10-CM | POA: Diagnosis present

## 2016-02-26 MED ORDER — SUMATRIPTAN SUCCINATE 100 MG PO TABS: 100.0000 mg | ORAL_TABLET | ORAL | 3 refills | Status: DC | PRN
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
## 2016-02-26 NOTE — Progress Notes (Signed)
Pt states she has migraine every month right before cycle starts. Pain mostly on left side of head.

## 2016-02-26 NOTE — Patient Instructions (Signed)
Migraine Headache A migraine headache is an intense, throbbing pain on one side or both sides of the head. Migraines may also cause other symptoms, such as nausea, vomiting, and sensitivity to light and noise. What are the causes? Doing or taking certain things may also trigger migraines, such as:  Alcohol.  Smoking.  Medicines, such as:  Medicine used to treat chest pain (nitroglycerine).  Birth control pills.  Estrogen pills.  Certain blood pressure medicines.  Aged cheeses, chocolate, or caffeine.  Foods or drinks that contain nitrates, glutamate, aspartame, or tyramine.  Physical activity. Other things that may trigger a migraine include:  Menstruation.  Pregnancy.  Hunger.  Stress, lack of sleep, too much sleep, or fatigue.  Weather changes. What increases the risk? The following factors may make you more likely to experience migraine headaches:  Age. Risk increases with age.  Family history of migraine headaches.  Being Caucasian.  Depression and anxiety.  Obesity.  Being a woman.  Having a hole in the heart (patent foramen ovale) or other heart problems. What are the signs or symptoms? The main symptom of this condition is pulsating or throbbing pain. Pain may:  Happen in any area of the head, such as on one side or both sides.  Interfere with daily activities.  Get worse with physical activity.  Get worse with exposure to bright lights or loud noises. Other symptoms may include:  Nausea.  Vomiting.  Dizziness.  General sensitivity to bright lights, loud noises, or smells. Before you get a migraine, you may get warning signs that a migraine is developing (aura). An aura may include:  Seeing flashing lights or having blind spots.  Seeing bright spots, halos, or zigzag lines.  Having tunnel vision or blurred vision.  Having numbness or a tingling feeling.  Having trouble talking.  Having muscle weakness. How is this diagnosed? A  migraine headache can be diagnosed based on:  Your symptoms.  A physical exam.  Tests, such as CT scan or MRI of the head. These imaging tests can help rule out other causes of headaches.  Taking fluid from the spine (lumbar puncture) and analyzing it (cerebrospinal fluid analysis, or CSF analysis). How is this treated? A migraine headache is usually treated with medicines that:  Relieve pain.  Relieve nausea.  Prevent migraines from coming back. Treatment may also include:  Acupuncture.  Lifestyle changes like avoiding foods that trigger migraines. Follow these instructions at home: Medicines  Take over-the-counter and prescription medicines only as told by your health care provider.  Do not drive or use heavy machinery while taking prescription pain medicine.  To prevent or treat constipation while you are taking prescription pain medicine, your health care provider may recommend that you:  Drink enough fluid to keep your urine clear or pale yellow.  Take over-the-counter or prescription medicines.  Eat foods that are high in fiber, such as fresh fruits and vegetables, whole grains, and beans.  Limit foods that are high in fat and processed sugars, such as fried and sweet foods. Lifestyle  Avoid alcohol use.  Do not use any products that contain nicotine or tobacco, such as cigarettes and e-cigarettes. If you need help quitting, ask your health care provider.  Get at least 8 hours of sleep every night.  Limit your stress. General instructions  Keep a journal to find out what may trigger your migraine headaches. For example, write down:  What you eat and drink.  How much sleep you get.  Any change  to your diet or medicines.  If you have a migraine:  Avoid things that make your symptoms worse, such as bright lights.  It may help to lie down in a dark, quiet room.  Do not drive or use heavy machinery.  Ask your health care provider what activities are  safe for you while you are experiencing symptoms.  Keep all follow-up visits as told by your health care provider. This is important. Contact a health care provider if:  You develop symptoms that are different or more severe than your usual migraine symptoms. Get help right away if:  Your migraine becomes severe.  You have a fever.  You have a stiff neck.  You have vision loss.  Your muscles feel weak or like you cannot control them.  You start to lose your balance often.  You develop trouble walking.  You faint. This information is not intended to replace advice given to you by your health care provider. Make sure you discuss any questions you have with your health care provider. Document Released: 01/20/2005 Document Revised: 08/10/2015 Document Reviewed: 07/09/2015 Elsevier Interactive Patient Education  2017 [employer].    Preventive Care 18-39 Years, Female Preventive care refers to lifestyle choices and visits with your health care provider that can promote health and wellness. What does preventive care include?  A yearly physical exam. This is also called an annual well check.  Dental exams once or twice a year.  Routine eye exams. Ask your health care provider how often you should have your eyes checked.  Personal lifestyle choices, including:  Daily care of your teeth and gums.  Regular physical activity.  Eating a healthy diet.  Avoiding tobacco and drug use.  Limiting alcohol use.  Practicing safe sex.  Taking vitamin and mineral supplements as recommended by your health care provider. What happens during an annual well check? The services and screenings done by your health care provider during your annual well check will depend on your age, overall health, lifestyle risk factors, and family history of disease. Counseling  Your health care provider may ask you questions about your:  Alcohol use.  Tobacco use.  Drug use.  Emotional  well-being.  Home and relationship well-being.  Sexual activity.  Eating habits.  Work and work Astronomer.  Method of birth control.  Menstrual cycle.  Pregnancy history. Screening  You may have the following tests or measurements:  Height, weight, and BMI.  Diabetes screening. This is done by checking your blood sugar (glucose) after you have not eaten for a while (fasting).  Blood pressure.  Lipid and cholesterol levels. These may be checked every 5 years starting at age 61.  Skin check.  Hepatitis C blood test.  Hepatitis B blood test.  Sexually transmitted disease (STD) testing.  BRCA-related cancer screening. This may be done if you have a family history of breast, ovarian, tubal, or peritoneal cancers.  Pelvic exam and Pap test. This may be done every 3 years starting at age 21. Starting at age 39, this may be done every 5 years if you have a Pap test in combination with an HPV test. Discuss your test results, treatment options, and if necessary, the need for more tests with your health care provider. Vaccines  Your health care provider may recommend certain vaccines, such as:  Influenza vaccine. This is recommended every year.  Tetanus, diphtheria, and acellular pertussis (Tdap, Td) vaccine. You may need a Td booster every 10 years.  Varicella vaccine. You  may need this if you have not been vaccinated.  HPV vaccine. If you are 100 or younger, you may need three doses over 6 months.  Measles, mumps, and rubella (MMR) vaccine. You may need at least one dose of MMR. You may also need a second dose.  Pneumococcal 13-valent conjugate (PCV13) vaccine. You may need this if you have certain conditions and were not previously vaccinated.  Pneumococcal polysaccharide (PPSV23) vaccine. You may need one or two doses if you smoke cigarettes or if you have certain conditions.  Meningococcal vaccine. One dose is recommended if you are age 22-21 years and a first-year  college student living in a residence hall, or if you have one of several medical conditions. You may also need additional booster doses.  Hepatitis A vaccine. You may need this if you have certain conditions or if you travel or work in places where you may be exposed to hepatitis A.  Hepatitis B vaccine. You may need this if you have certain conditions or if you travel or work in places where you may be exposed to hepatitis B.  Haemophilus influenzae type b (Hib) vaccine. You may need this if you have certain risk factors. Talk to your health care provider about which screenings and vaccines you need and how often you need them. This information is not intended to replace advice given to you by your health care provider. Make sure you discuss any questions you have with your health care provider. Document Released: 03/18/2001 Document Revised: 10/10/2015 Document Reviewed: 11/21/2014 Elsevier Interactive Patient Education  2017 [employer].

## 2016-02-29 LAB — CYTOLOGY - PAP
Diagnosis: NEGATIVE
HPV: NOT DETECTED

## 2016-06-05 ENCOUNTER — Encounter (HOSPITAL_COMMUNITY): Payer: Self-pay | Admitting: Family Medicine

## 2017-03-28 IMAGING — US US OB TRANSVAGINAL
1 series · 14 of 28 positions shown · non-contrast
Comparison: None.

CLINICAL DATA: Vaginal bleeding for past 2 weeks. nausea and
vomiting. Gestational age by LMP of 6 weeks 2 days.

EXAM:
TWIN OBSTETRIC <14WK US AND TRANSVAGINAL OB US

[Series 1: us ob transvaginal · 0.18mm/px · 14 of 84 slices shown]
[im 4/84]
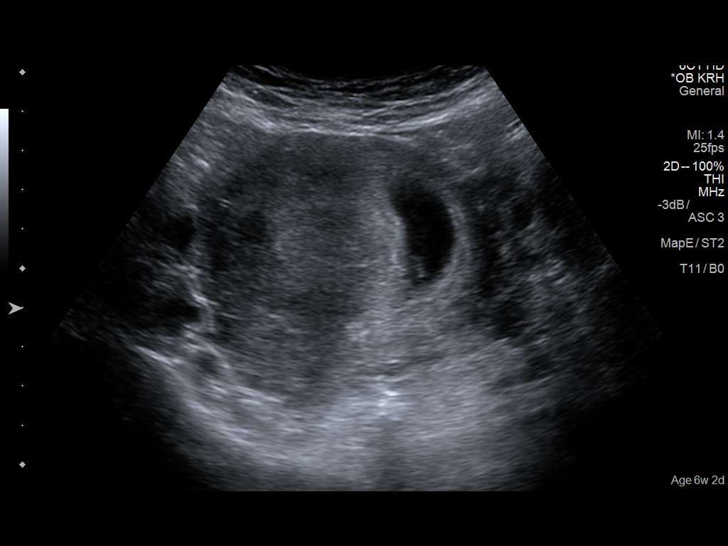
[im 10/84]
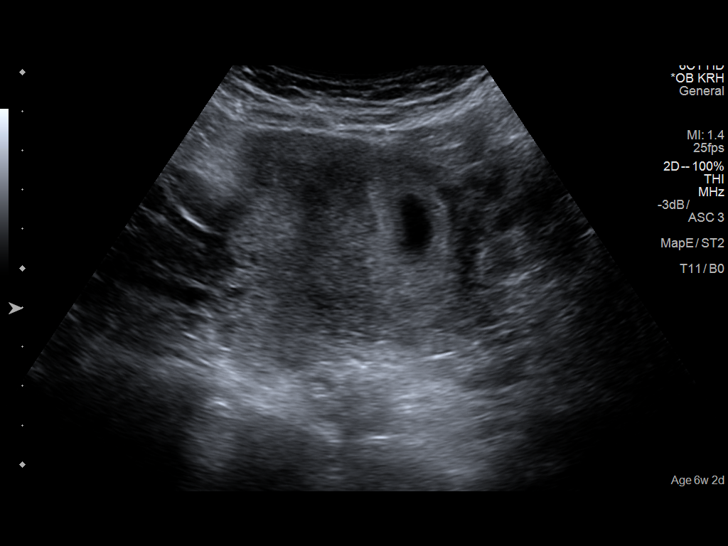
[im 16/84]
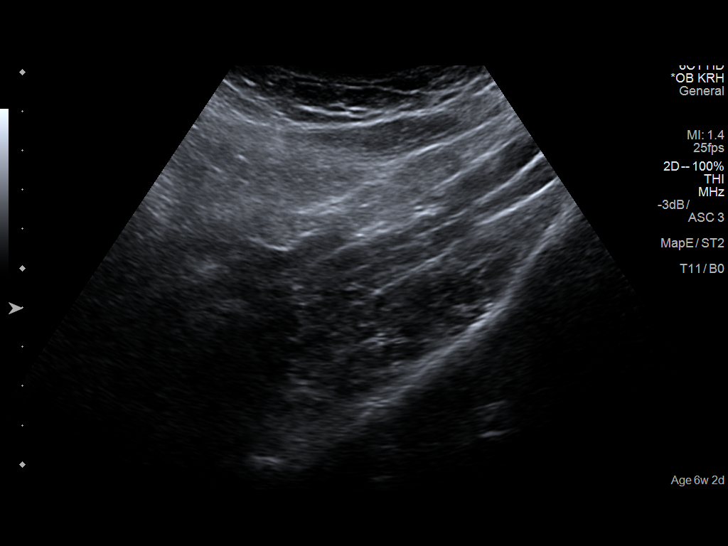
[im 22/84]
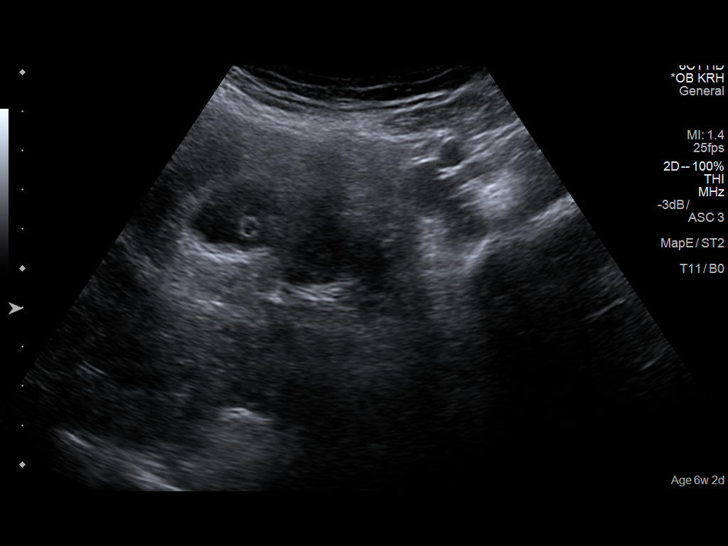
[im 28/84]
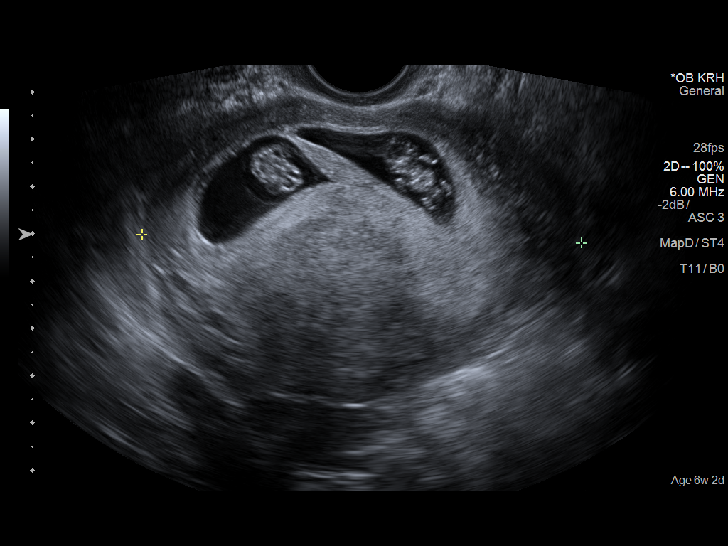
[im 34/84]
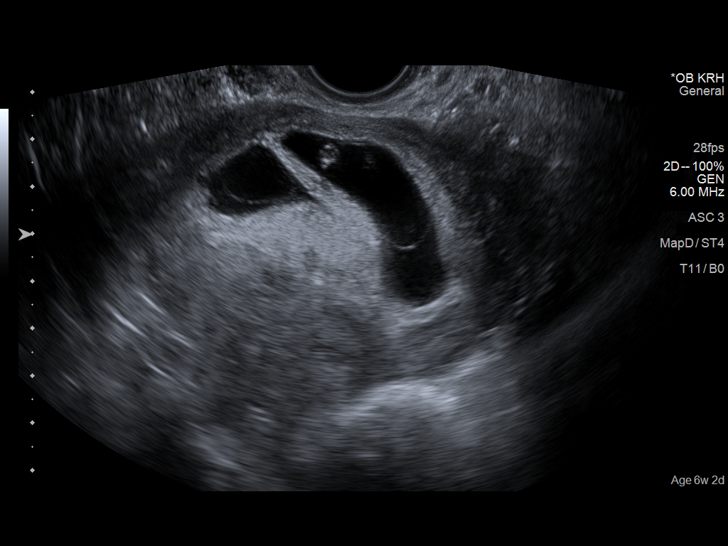
[im 40/84]
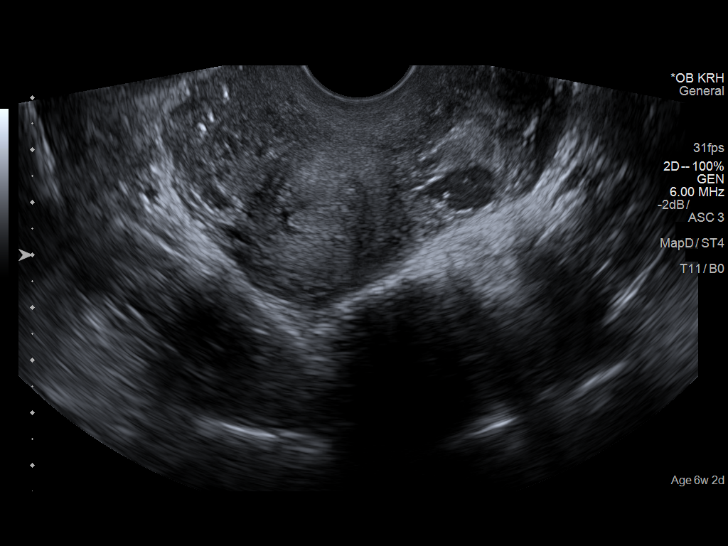
[im 47/84]
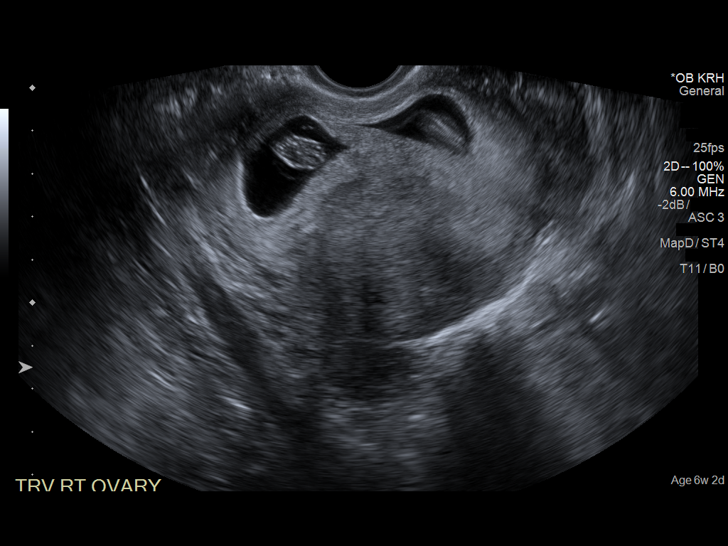
[im 53/84]
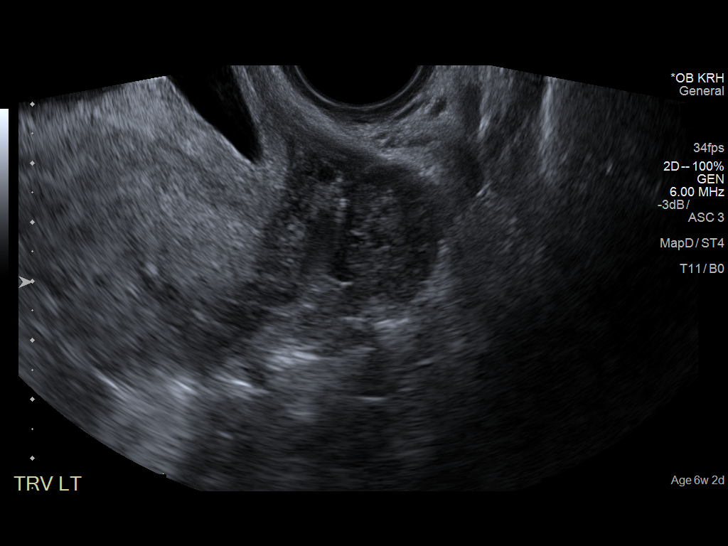
[im 59/84]
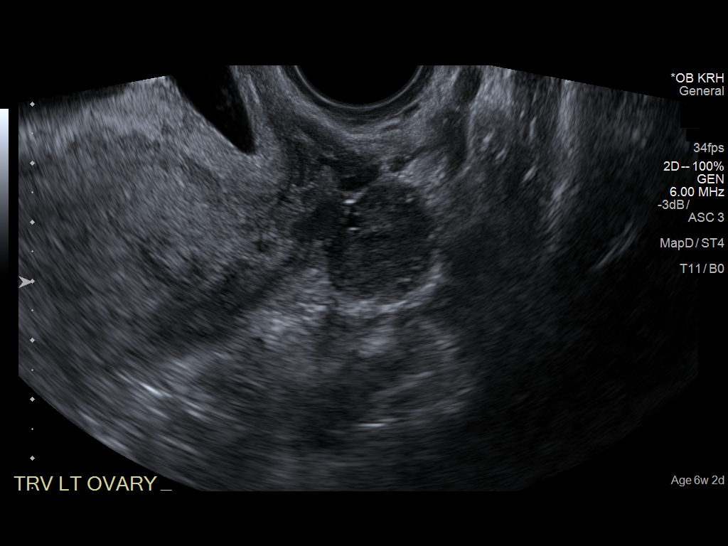
[im 65/84]
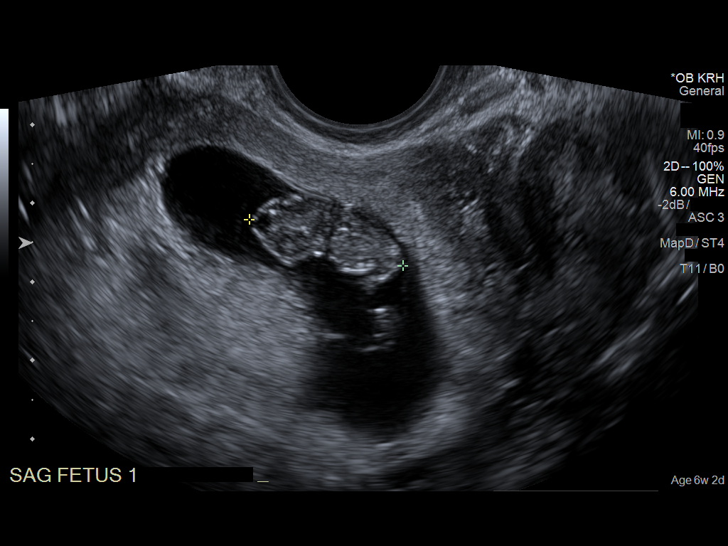
[im 71/84]
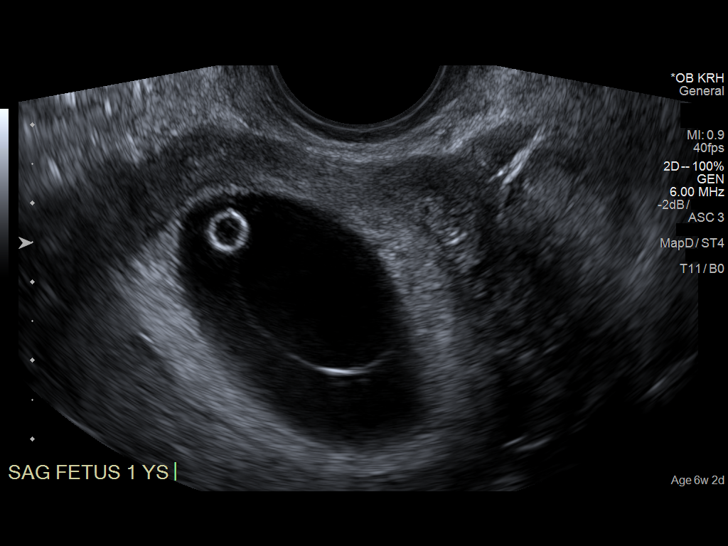
[im 77/84]
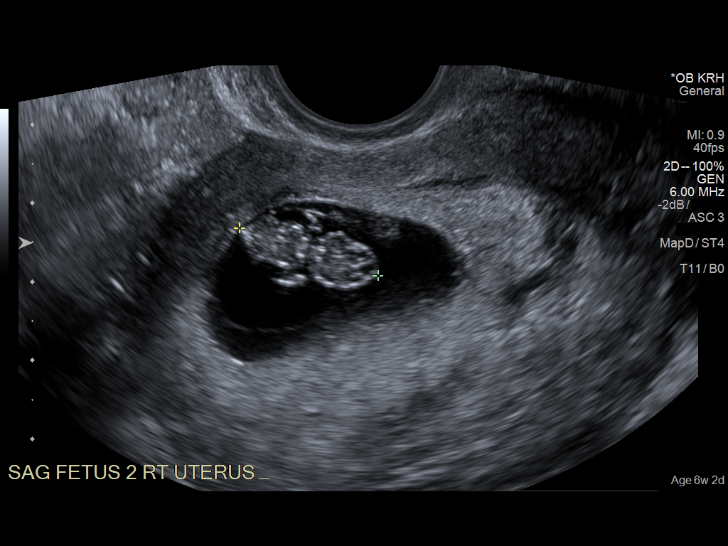
[im 84/84]
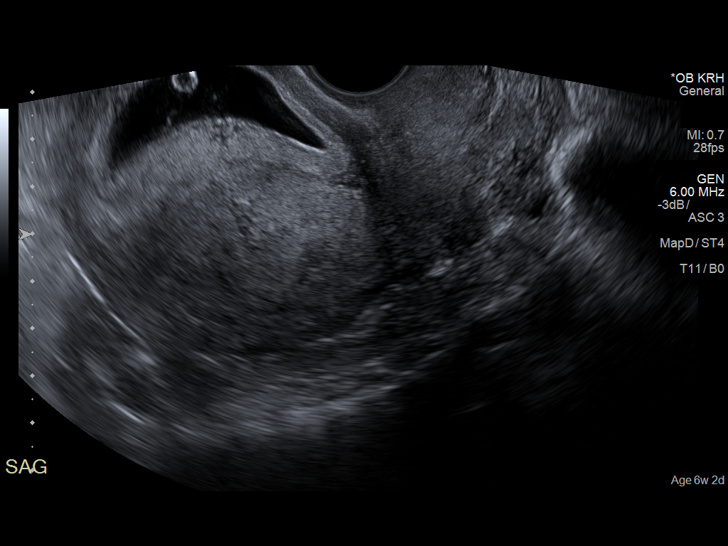

[14 of 28 positions shown; findings below may reference images not displayed]

FINDINGS: A living dichorionic twin IUP is seen.

TWIN 1

Intrauterine gestational sac: Visualized/normal in shape.

Yolk sac:  Visualized

Embryo:  Visualized

Cardiac Activity: Visualized

Heart Rate: 169 bpm

CRL:  20  mm   8 w 4 d                  US EDC: 09/25/2015

TWIN 2

Intrauterine gestational sac: Visualized/normal in shape.

Yolk sac:  Visualized

Embryo:  Visualized

Cardiac Activity: Visualized

Heart Rate: 175 bpm

CRL:  19  mm   8 w 3 d                  US EDC: 09/26/2015

Maternal uterus/adnexae: Tiny subchorionic hemorrhage seen adjacent
to right-sided gestational sac. Both ovaries are normal in
appearance. No mass or abnormal free fluid identified.
IMPRESSION: Living dichorionic twin IUP with estimated gestational age of 8
weeks 4 days in US EDC of 09/25/2015.

Small subchorionic hemorrhage noted.

## 2017-04-16 ENCOUNTER — Other Ambulatory Visit (HOSPITAL_COMMUNITY)
Admission: RE | Admit: 2017-04-16 | Discharge: 2017-04-16 | Disposition: A | Discharge status: Home / Self Care | Payer: Medicaid Other | Source: Ambulatory Visit | Attending: Family Medicine | Admitting: Family Medicine

## 2017-04-16 ENCOUNTER — Encounter: Payer: Self-pay | Admitting: Internal Medicine

## 2017-04-16 ENCOUNTER — Other Ambulatory Visit: Payer: Self-pay

## 2017-04-16 ENCOUNTER — Ambulatory Visit (INDEPENDENT_AMBULATORY_CARE_PROVIDER_SITE_OTHER): Payer: Medicaid Other | Admitting: Internal Medicine

## 2017-04-16 VITALS — BP 100/80 | HR 94 | Temp 98.5°F | Ht 63.0 in | Wt 188.0 lb

## 2017-04-16 DIAGNOSIS — Z113 Encounter for screening for infections with a predominantly sexual mode of transmission: Secondary | ICD-10-CM | POA: Diagnosis not present

## 2017-04-16 DIAGNOSIS — Z0001 Encounter for general adult medical examination with abnormal findings: Secondary | ICD-10-CM | POA: Diagnosis not present

## 2017-04-16 DIAGNOSIS — Z01419 Encounter for gynecological examination (general) (routine) without abnormal findings: Secondary | ICD-10-CM

## 2017-04-16 DIAGNOSIS — Z720 Tobacco use: Secondary | ICD-10-CM | POA: Diagnosis not present

## 2017-04-16 LAB — POCT WET PREP (WET MOUNT)
Clue Cells Wet Prep Whiff POC: NEGATIVE
TRICHOMONAS WET PREP HPF POC: ABSENT

## 2017-04-16 MED ORDER — VARENICLINE TARTRATE 0.5 MG X 11 & 1 MG X 42 PO MISC: ORAL | 0 refills | Status: DC

## 2017-04-16 NOTE — Assessment & Plan Note (Signed)
Currently smoking 8 cigarettes per day for 12 years. Wants to quit. Feels that she is motivated to quit. Has tried nicotine patches before, but they didn't really help. - Start Chantix - Advised patient to quit smoking on day 7 - Follow-up in 1 month

## 2017-04-16 NOTE — Patient Instructions (Signed)
It was so nice to meet you!  I will call you with your lab results.  Please start Chantix 1 week before you want to quit smoking. Day 1-3: Take 1 tablet (0.5mg ) by mouth once daily Day 3-7: Take 1 tablet (0.5mg ) by mouth twice daily After Day 7: Take 1 tablet (1mg ) by mouth twice daily  Please come back to see me in 1 month!  -Dr. Nancy MarusMayo

## 2017-04-16 NOTE — Progress Notes (Signed)
32 y.o. year old female presents for well woman/preventative visit and annual GYN examination.  Acute Concerns:  Diet: Eats pretty healthy. Loves raw vegetables.  Exercise: Doesn't exercise.  Sexual/Birth History: Has not been sexually active since August.  Birth Control: Has Nexplanon  Social:  Social History   Socioeconomic History  . Marital status: Single    Spouse name: None  . Number of children: None  . Years of education: None  . Highest education level: None  Social Needs  . Financial resource strain: None  . Food insecurity - worry: None  . Food insecurity - inability: None  . Transportation needs - medical: None  . Transportation needs - non-medical: None  Occupational History  . None  Tobacco Use  . Smoking status: Current Every Day Smoker    Packs/day: 0.25    Years: 8.00    Pack years: 2.00    Types: Cigarettes    Start date: 02/03/2001  . Smokeless tobacco: Never Used  . Tobacco comment: 6-7 cigarettes/day  Substance and Sexual Activity  . Alcohol use: No  . Drug use: No  . Sexual activity: Yes    Birth control/protection: Implant  Other Topics Concern  . None  Social History Narrative  . None    Immunization: Immunization History  Administered Date(s) Administered  . Tdap 02/12/2012, 06/03/2013, 07/03/2015    Cancer Screening:  Pap Smear: Last pap 02/26/16- normal  Mammogram: not indicated  Colonoscopy: not indicated  Dexa: not indicated  Physical Exam: VITALS: Reviewed GEN: Pleasant female, NAD HEENT: Normocephalic, PERRL, EOMI, no scleral icterus, bilateral TM pearly grey, nasal septum midline, MMM, uvula midline, no anterior or posterior lymphadenopathy, no thyromegaly CARDIAC:RRR, S1 and S2 present, no murmur, no heaves/thrills RESP: CTAB, normal effort ABD: soft, no tenderness, normal bowel sounds GU/GYN:Exam performed in the presence of a chaperone. Normal external genitalia. Cervix unremarkable. Bimanual exam identified no  masses EXT: No edema, 2+ radial and DP pulses SKIN: no rash  ASSESSMENT & PLAN: 32 y.o. female presents for annual well woman/preventative exam and GYN exam. Please see problem specific assessment and plan.   Encounter for STD Testing: Patient requesting STD testing. Has not been sexually active since August 2018. - Wet prep, gonorrhea, and chlamydia performed today - Patient declines HIV and RPR  Tobacco Use: Currently smoking 8 cigarettes per day for 12 years. Wants to quit. Feels that she is motivated to quit. Has tried nicotine patches before, but they didn't really help. - Start Chantix - Advised patient to quit smoking on day 7 - Follow-up in 1 month

## 2017-04-16 NOTE — Assessment & Plan Note (Signed)
Patient requesting STD testing. Has not been sexually active since August 2018. - Wet prep, gonorrhea, and chlamydia performed today - Patient declines HIV and RPR

## 2017-04-17 ENCOUNTER — Telehealth: Payer: Self-pay | Admitting: Internal Medicine

## 2017-04-17 LAB — CERVICOVAGINAL ANCILLARY ONLY
CHLAMYDIA, DNA PROBE: NEGATIVE
Neisseria Gonorrhea: NEGATIVE

## 2017-04-17 NOTE — Telephone Encounter (Signed)
Called patient to discuss her lab results. Left voicemail. STD testing negative. Patient should call us back with any questions.  Alicia CarolKaty Adianna Darwin, MD PGY-3

## 2017-07-01 ENCOUNTER — Other Ambulatory Visit (HOSPITAL_COMMUNITY)
Admission: RE | Admit: 2017-07-01 | Discharge: 2017-07-01 | Disposition: A | Discharge status: Home / Self Care | Payer: Medicaid Other | Source: Ambulatory Visit | Attending: Family Medicine | Admitting: Family Medicine

## 2017-07-01 ENCOUNTER — Other Ambulatory Visit: Payer: Self-pay

## 2017-07-01 ENCOUNTER — Ambulatory Visit (INDEPENDENT_AMBULATORY_CARE_PROVIDER_SITE_OTHER): Payer: Medicaid Other | Admitting: Family Medicine

## 2017-07-01 ENCOUNTER — Telehealth: Payer: Self-pay

## 2017-07-01 ENCOUNTER — Encounter: Payer: Self-pay | Admitting: Family Medicine

## 2017-07-01 VITALS — BP 110/70 | HR 89 | Temp 97.6°F | Wt 180.0 lb

## 2017-07-01 DIAGNOSIS — Z720 Tobacco use: Secondary | ICD-10-CM | POA: Diagnosis not present

## 2017-07-01 DIAGNOSIS — Z7251 High risk heterosexual behavior: Secondary | ICD-10-CM | POA: Diagnosis present

## 2017-07-01 LAB — POCT WET PREP (WET MOUNT)
Clue Cells Wet Prep Whiff POC: NEGATIVE
TRICHOMONAS WET PREP HPF POC: ABSENT

## 2017-07-01 LAB — POCT URINE PREGNANCY: PREG TEST UR: NEGATIVE

## 2017-07-01 MED ORDER — METRONIDAZOLE 500 MG PO TABS: 500.0000 mg | ORAL_TABLET | Freq: Two times a day (BID) | ORAL | 0 refills | Status: AC

## 2017-07-01 MED ORDER — VARENICLINE TARTRATE 0.5 MG X 11 & 1 MG X 42 PO MISC: ORAL | 0 refills | Status: DC

## 2017-07-01 MED ORDER — FLUCONAZOLE 150 MG PO TABS: 150.0000 mg | ORAL_TABLET | Freq: Once | ORAL | 0 refills | Status: AC

## 2017-07-01 NOTE — Progress Notes (Signed)
   CC: vaginal odor  HPI  Concern for STD: new female partner, sexually active w men in past. Hx of BV x1 7 years ago. No other hx of STDs, no abdominal or vaginal pain, c/w new odor and discharge. Discharge thin and white. No known exposures to STDs. Wants to return in 3 months to check HIV, RPR. LMP was end of April/first week of may, expected this week. Has nexplanon, notes some irregular bleeding.  Smoking: patient wants to quit, states chantix really helped. Down to 3 cigarettes per day, mostly associated with phone   ROS: Denies CP, SOB, abdominal pain, dysuria, changes in BMs.   CC, SH/smoking status, and VS noted  Objective: BP 110/70   Pulse 89   Temp 97.6 F (36.4 C) (Oral)   Wt 180 lb (81.6 kg)   LMP 06/01/2017   SpO2 99%   BMI 31.89 kg/m  Gen: NAD, alert, cooperative, and pleasant. HEENT: NCAT, EOMI, PERRL CV: RRR, no murmur Resp: CTAB, no wheezes, non-labored Abd: SNTND, BS present, no guarding or organomegaly Ext: No edema, warm GU: normal external female genitalia, thin white discharge in vaginal vault, no CMT or lesions.  Neuro: Alert and oriented, Speech clear, No gross deficits  Assessment and plan:  Tobacco abuse Restart chantix today. Patient counseled on behavioral tactics to avoid smoking at accustomed times.   Vaginal odor - plan to call patient when lab is able to run test. She declines HIV/RPR today.   Orders Placed This Encounter  Procedures  . POCT urine pregnancy  . POCT Wet Prep [facility])    Meds ordered this encounter  Medications  . DISCONTD: varenicline (CHANTIX STARTING MONTH PAK) 0.5 MG X 11 & 1 MG X 42 tablet    Sig: Take one 0.5 mg tablet by mouth once daily for 3 days, then increase to one 0.5 mg tablet twice daily for 4 days, then increase to one 1 mg tablet twice daily.    Dispense:  53 tablet    Refill:  0  . metroNIDAZOLE (FLAGYL) 500 MG tablet    Sig: Take 1 tablet (500 mg total) by mouth 2 (two) times daily for 7 days.   Dispense:  14 tablet    Refill:  0  . fluconazole (DIFLUCAN) 150 MG tablet    Sig: Take 1 tablet (150 mg total) by mouth once for 1 dose.    Dispense:  1 tablet    Refill:  0  . varenicline (CHANTIX STARTING MONTH PAK) 0.5 MG X 11 & 1 MG X 42 tablet    Sig: Take 1 0.5 mg tablet once daily for 3 days, then increase to 1 0.5 mg tablet twice daily x 4 days, then increase to one 1 mg twice daily.    Dispense:  53 tablet    Refill:  0    Loni Muse, MD, PGY2 07/01/2017 11:14 AM

## 2017-07-01 NOTE — Telephone Encounter (Signed)
Returned her call, explained wet prep + Bv and yeast, already sent rx.

## 2017-07-01 NOTE — Telephone Encounter (Signed)
Pt left message on nurse line stating she just missed a call from Dr. Chanetta Marshall. Call back [phone] Shawna Orleans, RN

## 2017-07-01 NOTE — Assessment & Plan Note (Signed)
Restart chantix today. Patient counseled on behavioral tactics to avoid smoking at accustomed times.

## 2017-07-02 LAB — URINE CYTOLOGY ANCILLARY ONLY
CHLAMYDIA, DNA PROBE: NEGATIVE
Neisseria Gonorrhea: NEGATIVE

## 2017-07-03 NOTE — Telephone Encounter (Signed)
Pt informed. Zaeda Mcferran T Billyjack Trompeter, CMA  

## 2017-07-03 NOTE — Telephone Encounter (Signed)
-----   Message from Garth BignessKathryn Timberlake, MD sent at 07/03/2017  9:49 AM EDT ----- [facility]if[facility]on AstersWhite team, can you let her know that her g/c testing was negative? Thanks.

## 2017-07-20 IMAGING — US US MFM OB TRANSVAGINAL
1 series · 12 of 28 positions shown · non-contrast
Comparison: none

[Series 1: us mfm ob transvaginal · 126 acquisitions, 12 frames shown]
[im 5/126]
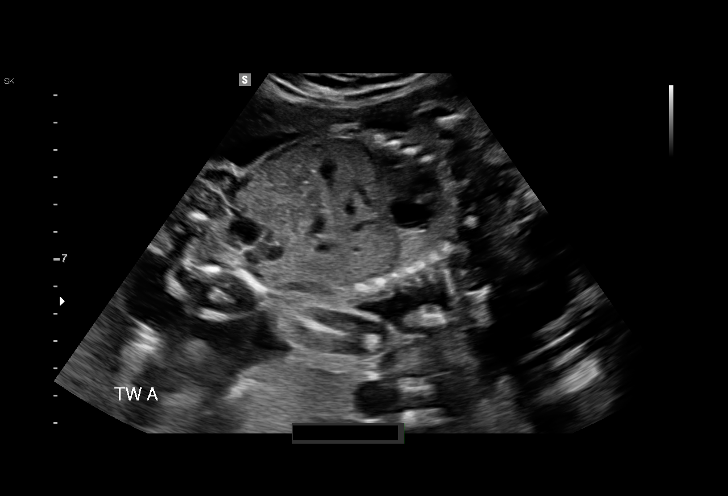
[im 14/126]
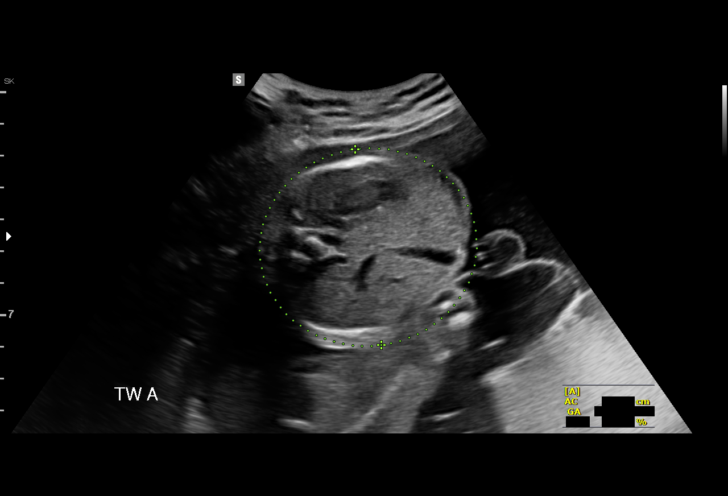
[im 24/126]
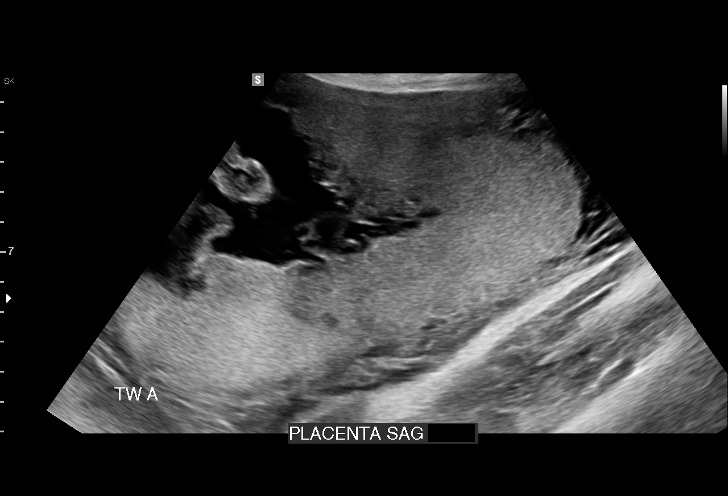
[im 38/126]
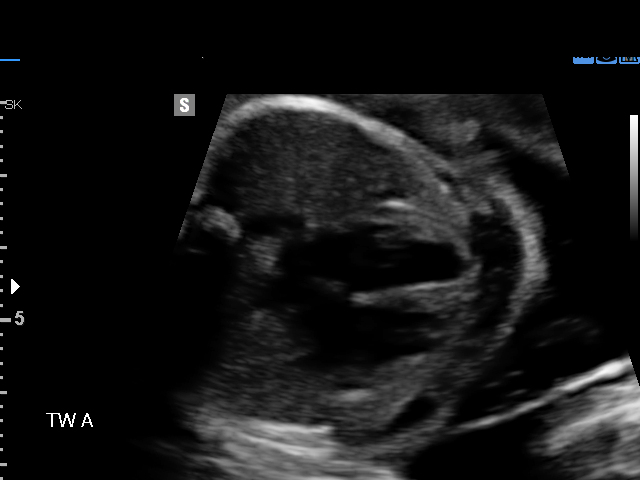
[im 47/126]
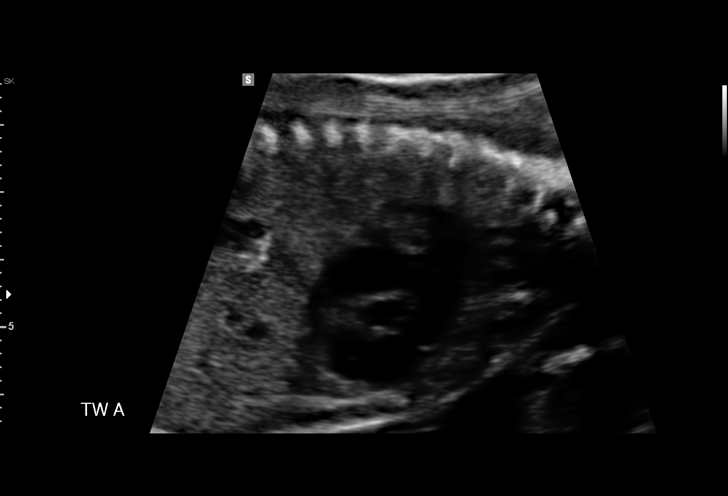
[im 56/126]
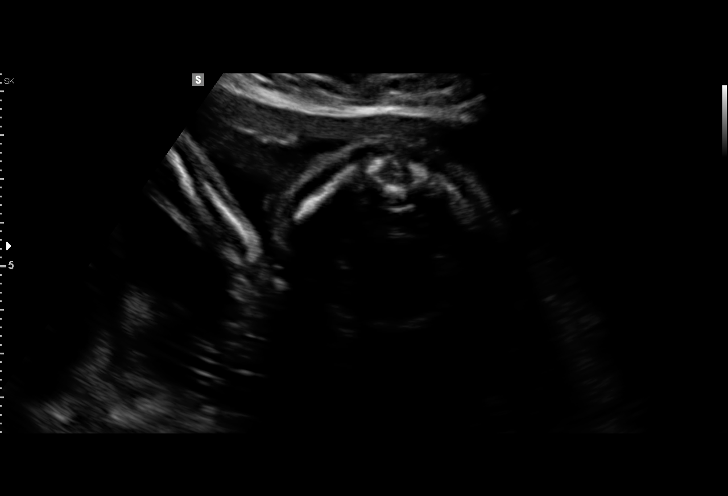
[im 70/126]
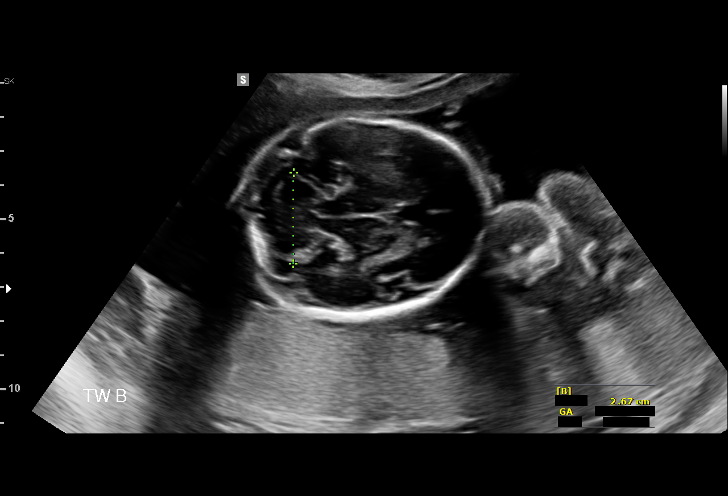
[im 79/126]
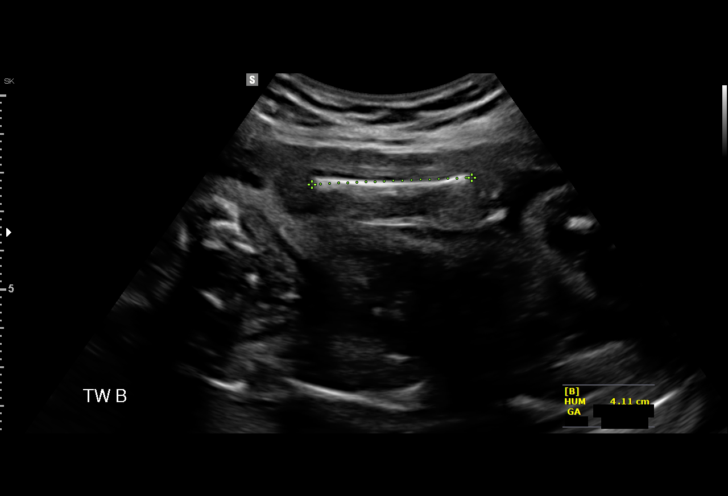
[im 88/126]
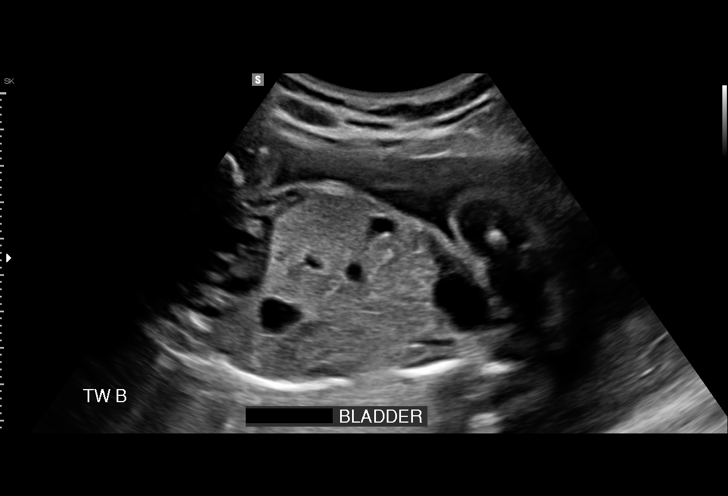
[im 102/126]
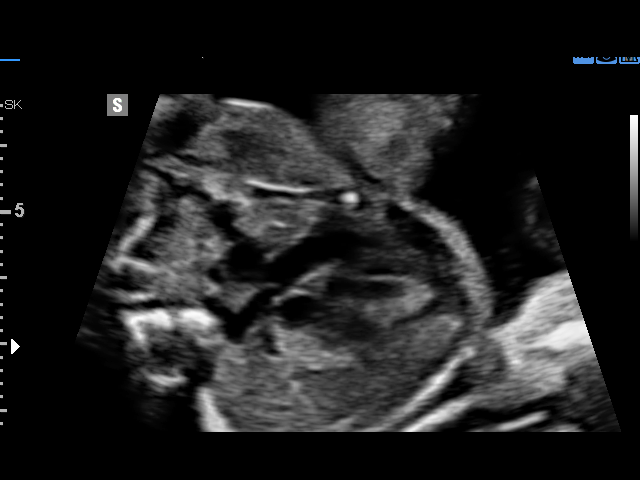
[im 112/126]
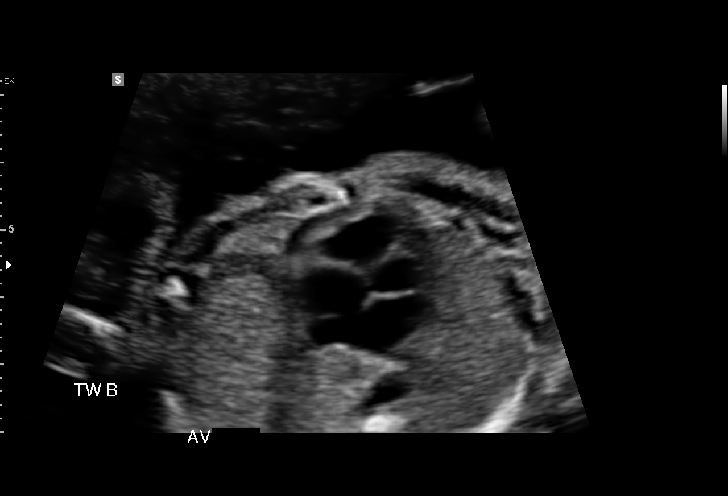
[im 121/126]
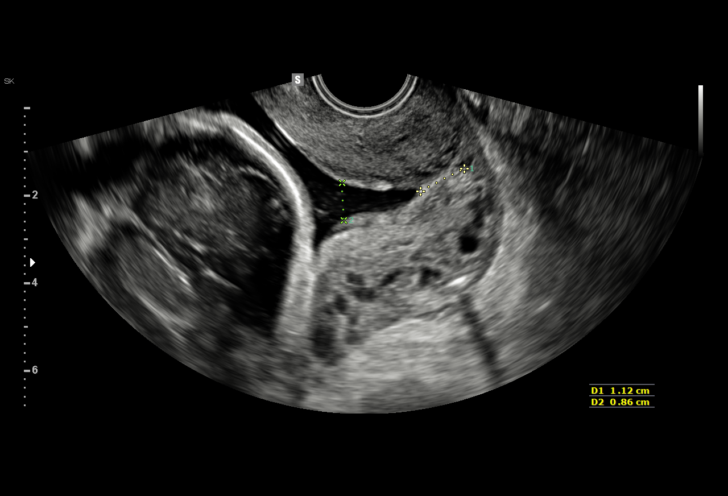

[12 of 28 positions shown; findings below may reference images not displayed]

1  ZACHARY TY            [id number]      [id number]     [id number]
2  ZACHARY TY            [id number]      [id number]     [id number]
3  ZACHARY TY            [id number]      [id number]     [id number]
Indications

25 weeks gestation of pregnancy
Twin pregnancy, di/di, second trimester
Poor obstetric history: Previous preterm
delivery (12w7d, 97w1d)
Previous pregnancy complicated by
chromosomal abnormality, antepartum
Trisomy 21 - declined all screening/testing
Tobacco use complicating pregnancy
Obesity complicating pregnancy, second
trimester
OB History

Gravidity:    6         Term:   4        Prem:   1         SAB:   0
TOP:          0       Ectopic:  0        Living: 6
Fetal Evaluation (Fetus A)

Num Of Fetuses:     2
Fetal Heart         155
Rate(bpm):
Cardiac Activity:   Observed
Fetal Lie:          Left Fetus
Presentation:       Cephalic
Placenta:           Posterior LT, above cervical os

Amniotic Fluid
AFI FV:      Subjectively within normal limits

Largest Pocket(cm)
4.3
Biometry (Fetus A)

BPD:      63.8  mm     G. Age:  25w 6d         55  %    CI:         73.96  %    70 - 86
FL/HC:       20.1  %    18.7 -
HC:      235.6  mm     G. Age:  25w 4d         34  %    HC/AC:       1.18       1.04 -
AC:      200.2  mm     G. Age:  24w 4d         20  %    FL/BPD:      74.3  %    71 - 87
FL:       47.4  mm     G. Age:  25w 6d         49  %    FL/AC:       23.7  %    20 - 24
HUM:      42.7  mm     G. Age:  25w 4d         48  %
CER:      28.1  mm     G. Age:  25w 1d         45  %

Est. FW:     786   gm    1 lb 12 oz     50  %     FW Discordancy      0 \ 5  %
Gestational Age (Fetus A)

LMP:           24w 1d        Date:  12/28/14                 EDD:    10/04/15
U/S Today:     25w 3d                                        EDD:    09/25/15
Best:          25w 3d     Det. By:  Early Ultrasound         EDD:    09/25/15
(02/17/15)
Anatomy (Fetus A)

Cranium:               Appears normal         Aortic Arch:            Appears normal
Cavum:                 Appears normal         Ductal Arch:            Appears normal
Ventricles:            Appears normal         Diaphragm:              Appears normal
Choroid Plexus:        Appears normal         Stomach:                Appears normal, left
sided
Cerebellum:            Appears normal         Abdomen:                Appears normal
Posterior Fossa:       Previously seen        Abdominal Wall:         Appears nml (cord
insert, abd wall)
Nuchal Fold:           Previously seen        Cord Vessels:           Appears normal (3
vessel cord)
Face:                  Orbits and profile     Kidneys:                Appear normal
previously seen
Lips:                  Previously seen        Bladder:                Appears normal
Thoracic:              Appears normal         Spine:                  Appears normal
Heart:                 Appears normal         Upper Extremities:      Previously seen
(4CH, axis, and
situs)
RVOT:                  Appears normal         Lower Extremities:      Previously seen
LVOT:                  Appears normal

Other:  Fetus appears to be a male. Heels and 5th digit visualized. Nasal
bone previously seen.

Fetal Evaluation (Fetus B)

Num Of Fetuses:     2
Fetal Heart         148
Rate(bpm):
Cardiac Activity:   Observed
Fetal Lie:          Right Fetus
Presentation:       Breech
Placenta:           Posterior, above cervical os

Amniotic Fluid
AFI FV:      Subjectively within normal limits

Largest Pocket(cm)
5
Biometry (Fetus B)

BPD:      58.6  mm     G. Age:  24w 0d          5  %    CI:         71.16  %    70 - 86
FL/HC:       21.3  %    18.7 -
HC:      221.3  mm     G. Age:  24w 1d          3  %    HC/AC:       1.12       1.04 -
AC:      198.3  mm     G. Age:  24w 4d         17  %    FL/BPD:      80.4  %    71 - 87
FL:       47.1  mm     G. Age:  25w 5d         45  %    FL/AC:       23.8  %    20 - 24
HUM:      41.9  mm     G. Age:  25w 2d         39  %
CER:      26.4  mm     G. Age:  24w 1d         20  %

Est. FW:     745   gm    1 lb 10 oz     41  %     FW Discordancy         5   %
Gestational Age (Fetus B)

LMP:           24w 1d        Date:  12/28/14                 EDD:    10/04/15
U/S Today:     24w 4d                                        EDD:    10/01/15
Best:          25w 3d     Det. By:  Early Ultrasound         EDD:    09/25/15
(02/17/15)
Anatomy (Fetus B)

Cranium:               Appears normal         Aortic Arch:            Previously seen
Cavum:                 Appears normal         Ductal Arch:            Previously seen
Ventricles:            Appears normal         Diaphragm:              Appears normal
Choroid Plexus:        Appears normal         Stomach:                Appears normal, left
sided
Cerebellum:            Appears normal         Abdomen:                Appears normal
Posterior Fossa:       Previously seen        Abdominal Wall:         Appears nml (cord
insert, abd wall)
Nuchal Fold:           Previously seen        Cord Vessels:           Appears normal (3
vessel cord)
Face:                  Appears normal         Kidneys:                Appear normal
(orbits and profile)
Lips:                  Appears normal         Bladder:                Appears normal
Thoracic:              Appears normal         Spine:                  Not well visualized
Heart:                 Appears normal         Upper Extremities:      Appears normal
(4CH, axis, and situs
RVOT:                  Appears normal         Lower Extremities:      Appears normal
LVOT:                  Appears normal

Other:  Fetus appears to be a female. Heels and 5th digit previously seen. .
Nasal bone visualized. Technically difficult due to fetal position.
Cervix Uterus Adnexa

Cervix
Length:            1.2  cm.
Normal appearance by transvaginal scan

Left Ovary
Previously seen.

Right Ovary
Within normal limits.
Impression

Dichorionic/diamniotic twin pregnancy at 25+3 weeks
Normal interval anatomy x 2; anatomic survey complete
except for Twin B's spine
Normal amniotic fluid volume x 2
Appropriate interval growths with EFWs at the 50th and 41st
%tile
EV views of cervix: funneling with distal closed portion
measuring 1.2 cms
Recommendations

Discussed with Dr. Erxleben; to [facility] for further evaluation
Follow-up ultrasound for cervical length in 1-2 weeks

## 2017-10-23 ENCOUNTER — Emergency Department (HOSPITAL_BASED_OUTPATIENT_CLINIC_OR_DEPARTMENT_OTHER): Payer: Medicaid Other

## 2017-10-23 ENCOUNTER — Other Ambulatory Visit: Payer: Self-pay

## 2017-10-23 ENCOUNTER — Encounter (HOSPITAL_BASED_OUTPATIENT_CLINIC_OR_DEPARTMENT_OTHER): Payer: Self-pay | Admitting: *Deleted

## 2017-10-23 ENCOUNTER — Emergency Department (HOSPITAL_BASED_OUTPATIENT_CLINIC_OR_DEPARTMENT_OTHER)
Admission: EM | Admit: 2017-10-23 | Discharge: 2017-10-23 | Disposition: A | Discharge status: Home / Self Care | Payer: Medicaid Other | Attending: Emergency Medicine | Admitting: Emergency Medicine

## 2017-10-23 DIAGNOSIS — R05 Cough: Secondary | ICD-10-CM | POA: Diagnosis not present

## 2017-10-23 DIAGNOSIS — F17210 Nicotine dependence, cigarettes, uncomplicated: Secondary | ICD-10-CM | POA: Diagnosis not present

## 2017-10-23 DIAGNOSIS — J069 Acute upper respiratory infection, unspecified: Secondary | ICD-10-CM | POA: Diagnosis not present

## 2017-10-23 DIAGNOSIS — R0602 Shortness of breath: Secondary | ICD-10-CM | POA: Diagnosis not present

## 2017-10-23 DIAGNOSIS — B9789 Other viral agents as the cause of diseases classified elsewhere: Secondary | ICD-10-CM

## 2017-10-23 DIAGNOSIS — R079 Chest pain, unspecified: Secondary | ICD-10-CM | POA: Diagnosis not present

## 2017-10-23 DIAGNOSIS — Z79899 Other long term (current) drug therapy: Secondary | ICD-10-CM | POA: Insufficient documentation

## 2017-10-23 MED ORDER — ACETAMINOPHEN 325 MG PO TABS
650.0000 mg | ORAL_TABLET | Freq: Once | ORAL | Status: AC
  Administered 2017-10-23: 650 mg via ORAL
  Filled 2017-10-23: qty 2

## 2017-10-23 MED ORDER — BENZONATATE 100 MG PO CAPS: 100.0000 mg | ORAL_CAPSULE | Freq: Three times a day (TID) | ORAL | 0 refills | Status: AC

## 2017-10-23 NOTE — ED Triage Notes (Signed)
Body aches, stuffy nose, cough and no appetite x 3 days. She feels like she has the flu.

## 2017-10-23 NOTE — Discharge Instructions (Addendum)
You may take tylenol for your fever.I have prescribed tessalon pearls to help with your cough. Follow up with PCP for reevaluation in 1 week.If you experience worsening symptoms or shortness of breath, chest pain please return to the ED for reevaluation.
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
## 2017-10-23 NOTE — ED Notes (Signed)
No pt signature upon d/c due to computer in room not working

## 2018-01-08 ENCOUNTER — Encounter: Payer: Self-pay | Admitting: Obstetrics and Gynecology

## 2018-01-08 ENCOUNTER — Ambulatory Visit (INDEPENDENT_AMBULATORY_CARE_PROVIDER_SITE_OTHER): Payer: Medicaid Other | Admitting: Obstetrics and Gynecology

## 2018-01-08 ENCOUNTER — Other Ambulatory Visit (HOSPITAL_COMMUNITY)
Admission: RE | Admit: 2018-01-08 | Discharge: 2018-01-08 | Disposition: A | Discharge status: Home / Self Care | Payer: Medicaid Other | Source: Ambulatory Visit | Attending: Obstetrics and Gynecology | Admitting: Obstetrics and Gynecology

## 2018-01-08 VITALS — Ht 63.0 in | Wt 180.5 lb

## 2018-01-08 DIAGNOSIS — Z3046 Encounter for surveillance of implantable subdermal contraceptive: Secondary | ICD-10-CM

## 2018-01-08 DIAGNOSIS — Z3042 Encounter for surveillance of injectable contraceptive: Secondary | ICD-10-CM | POA: Diagnosis not present

## 2018-01-08 DIAGNOSIS — L299 Pruritus, unspecified: Secondary | ICD-10-CM | POA: Insufficient documentation

## 2018-01-08 MED ORDER — MEDROXYPROGESTERONE ACETATE 150 MG/ML IM SUSP: 150.0000 mg | INTRAMUSCULAR | 4 refills | Status: DC

## 2018-01-08 MED ORDER — MEDROXYPROGESTERONE ACETATE 150 MG/ML IM SUSP
150.0000 mg | Freq: Once | INTRAMUSCULAR | Status: AC
  Administered 2018-01-08: 150 mg via INTRAMUSCULAR

## 2018-01-08 NOTE — Progress Notes (Signed)
  Pt states the reason why she wants Nexplanon removed is because she is experiencing very bad headaches on left side of head & has been spotting since the end of October. Pt would like to discuss other options of BC.

## 2018-01-08 NOTE — Progress Notes (Signed)
32 yo here for Nexplanon removal. Patient had Nexplanon placed in 07/2015. Patient desires its removal due to persistent headaches and DUB. Patient desires Depo-provera for contraception   Removal Patient given informed consent for removal of her Implanon, time out was performed.  Signed copy in the chart.  Appropriate time out taken. Implanon site identified.  Area prepped in usual sterile fashon. One cc of 1% lidocaine was used to anesthetize the area at the distal end of the implant. A small stab incision was made right beside the implant on the distal portion.  The implanon rod was grasped using hemostats and removed without difficulty.  There was less than 3 cc blood loss. There were no complications.  A small amount of antibiotic ointment and steri-strips were applied over the small incision.  A pressure bandage was applied to reduce any bruising.  The patient tolerated the procedure well and was given post procedure instructions.   Patient with normal pap smear 02/2016

## 2018-01-08 NOTE — Addendum Note (Signed)
Addended by: Henrietta DineNEAL, PAMELA S on: 01/08/2018 10:55 AM   Modules accepted: Orders

## 2018-01-11 ENCOUNTER — Telehealth: Payer: Self-pay | Admitting: *Deleted

## 2018-01-11 LAB — CERVICOVAGINAL ANCILLARY ONLY
BACTERIAL VAGINITIS: POSITIVE — AB
Candida vaginitis: NEGATIVE

## 2018-01-11 MED ORDER — METRONIDAZOLE 500 MG PO TABS: 500.0000 mg | ORAL_TABLET | Freq: Two times a day (BID) | ORAL | 0 refills | Status: DC

## 2018-01-11 NOTE — Addendum Note (Signed)
Addended by: Catalina AntiguaONSTANT, Delina Kruczek on: 01/11/2018 03:13 PM   Modules accepted: Orders

## 2018-01-11 NOTE — Telephone Encounter (Signed)
Received a voice message- asking for results from STD check on Friday.  I called Alicia Pearson and we discussed Dr.Constant had sent a message- she states yes, I just got it. We discussed she was +for BV and rx for flagyl sent to her pharmacy. We discussed she should avoid alcohol while on flagyl and should take with food to decrease GI upset. She voices understanding.

## 2018-02-04 ENCOUNTER — Ambulatory Visit: Payer: Medicaid Other | Admitting: Family Medicine

## 2018-04-01 ENCOUNTER — Emergency Department (HOSPITAL_BASED_OUTPATIENT_CLINIC_OR_DEPARTMENT_OTHER)
Admission: EM | Admit: 2018-04-01 | Discharge: 2018-04-01 | Disposition: A | Discharge status: Home / Self Care | Payer: Medicaid Other | Attending: Emergency Medicine | Admitting: Emergency Medicine

## 2018-04-01 ENCOUNTER — Encounter (HOSPITAL_BASED_OUTPATIENT_CLINIC_OR_DEPARTMENT_OTHER): Payer: Self-pay

## 2018-04-01 ENCOUNTER — Other Ambulatory Visit: Payer: Self-pay

## 2018-04-01 ENCOUNTER — Emergency Department (HOSPITAL_BASED_OUTPATIENT_CLINIC_OR_DEPARTMENT_OTHER): Payer: Medicaid Other

## 2018-04-01 DIAGNOSIS — Y939 Activity, unspecified: Secondary | ICD-10-CM | POA: Insufficient documentation

## 2018-04-01 DIAGNOSIS — Y929 Unspecified place or not applicable: Secondary | ICD-10-CM | POA: Insufficient documentation

## 2018-04-01 DIAGNOSIS — M25512 Pain in left shoulder: Secondary | ICD-10-CM | POA: Insufficient documentation

## 2018-04-01 DIAGNOSIS — Z79899 Other long term (current) drug therapy: Secondary | ICD-10-CM | POA: Diagnosis not present

## 2018-04-01 DIAGNOSIS — X509XXA Other and unspecified overexertion or strenuous movements or postures, initial encounter: Secondary | ICD-10-CM | POA: Insufficient documentation

## 2018-04-01 DIAGNOSIS — Y999 Unspecified external cause status: Secondary | ICD-10-CM | POA: Insufficient documentation

## 2018-04-01 DIAGNOSIS — S4992XA Unspecified injury of left shoulder and upper arm, initial encounter: Secondary | ICD-10-CM | POA: Diagnosis not present

## 2018-04-01 DIAGNOSIS — F17210 Nicotine dependence, cigarettes, uncomplicated: Secondary | ICD-10-CM | POA: Insufficient documentation

## 2018-04-01 MED ORDER — ACETAMINOPHEN 325 MG PO TABS
650.0000 mg | ORAL_TABLET | Freq: Once | ORAL | Status: AC
  Administered 2018-04-01: 650 mg via ORAL
  Filled 2018-04-01: qty 2

## 2018-04-01 NOTE — ED Notes (Signed)
Patient transported to X-ray 

## 2018-04-01 NOTE — Discharge Instructions (Addendum)
You have been seen today for shoulder pain. Please read and follow all provided instructions.   1. Medications: tylenol for pain, usual home medications 2. Treatment: rest, drink plenty of fluids, use sling for comfort. Do pendulum exercises daily. Your x ray did not reveal a fracture or dislocation. Your x ray reveals Os acromiale.  3. Follow Up: Please follow up with your primary doctor in 7 days for discussion of your diagnoses and further evaluation after today's visit; if you do not have a primary care doctor use the resource guide provided to find one; Please return to the ER for any new or worsening symptoms. Please obtain all of your results from medical records or have your doctors office obtain the results - share them with your doctor - you should be seen at your doctors office. Call today to arrange your follow up.   Take medications as prescribed. Please review all of the medicines and only take them if you do not have an allergy to them. Return to the emergency room for worsening condition or new concerning symptoms. Follow up with your regular doctor. If you don't have a regular doctor use one of the numbers below to establish a primary care doctor.  Please be aware that if you are taking birth control pills, taking other prescriptions, ESPECIALLY ANTIBIOTICS may make the birth control ineffective - if this is the case, either do not engage in sexual activity or use alternative methods of birth control such as condoms until you have finished the medicine and your family doctor says it is OK to restart them. If you are on a blood thinner such as COUMADIN, be aware that any other medicine that you take may cause the coumadin to either work too much, or not enough - you should have your coumadin level rechecked in next 7 days if this is the case.  ?  It is also a possibility that you have an allergic reaction to any of the medicines that you have been prescribed - Everybody reacts differently  to medications and while MOST people have no trouble with most medicines, you may have a reaction such as nausea, vomiting, rash, swelling, shortness of breath. If this is the case, please stop taking the medicine immediately and contact your physician.  ?  You should return to the ER if you develop severe or worsening symptoms.   Emergency Department Resource Guide 1) Find a Doctor and Pay Out of Pocket Although you won't have to find out who is covered by your insurance plan, it is a good idea to ask around and get recommendations. You will then need to call the office and see if the doctor you have chosen will accept you as a new patient and what types of options they offer for patients who are self-pay. Some doctors offer discounts or will set up payment plans for their patients who do not have insurance, but you will need to ask so you aren't surprised when you get to your appointment.  2) Contact Your Local Health Department Not all health departments have doctors that can see patients for sick visits, but many do, so it is worth a call to see if yours does. If you don't know where your local health department is, you can check in your phone book. The CDC also has a tool to help you locate your state's health department, and many state websites also have listings of all of their local health departments.  3) Find a Walk-in  Clinic If your illness is not likely to be very severe or complicated, you may want to try a walk in clinic. These are popping up all over the country in pharmacies, drugstores, and shopping centers. They're usually staffed by nurse practitioners or physician assistants that have been trained to treat common illnesses and complaints. They're usually fairly quick and inexpensive. However, if you have serious medical issues or chronic medical problems, these are probably not your best option.  No Primary Care Doctor: Call Health Connect at  [phone] - they can help you locate a  primary care doctor that  accepts your insurance, provides certain services, etc. Physician Referral [phone]  Emergency Department Resource Guide 1) Find a Doctor and Pay Out of Pocket Although you won't have to find out who is covered by your insurance plan, it is a good idea to ask around and get recommendations. You will then need to call the office and see if the doctor you have chosen will accept you as a new patient and what types of options they offer for patients who are self-pay. Some doctors offer discounts or will set up payment plans for their patients who do not have insurance, but you will need to ask so you aren't surprised when you get to your appointment.  2) Contact Your Local Health Department Not all health departments have doctors that can see patients for sick visits, but many do, so it is worth a call to see if yours does. If you don't know where your local health department is, you can check in your phone book. The CDC also has a tool to help you locate your state's health department, and many state websites also have listings of all of their local health departments.  3) Find a Walk-in Clinic If your illness is not likely to be very severe or complicated, you may want to try a walk in clinic. These are popping up all over the country in pharmacies, drugstores, and shopping centers. They're usually staffed by nurse practitioners or physician assistants that have been trained to treat common illnesses and complaints. They're usually fairly quick and inexpensive. However, if you have serious medical issues or chronic medical problems, these are probably not your best option.  No Primary Care Doctor: Call Health Connect at  [phone] - they can help you locate a primary care doctor that  accepts your insurance, provides certain services, etc. Physician Referral Service- [phone]  Chronic Pain Problems: Organization         Address  Phone   Notes  [facility]  [phone] Patients need to be referred by their primary care doctor.   Medication Assistance: Organization         Address  Phone   Notes  [facility] Medication [facility] [address] [place], [place] [address] [phone] --Must be a resident of [facility] -- Must have NO insurance coverage whatsoever (no Medicaid/ Medicare, etc.) -- The pt. MUST have a primary care doctor that directs their care regularly and follows them in the community   [facility]  [phone]   [employer]  [phone]    Agencies that provide inexpensive medical care: Organization         Address  Phone   Notes  [facility]  [phone]   [facility]    [phone]   [facility] [address] [place], [place]  [address] [phone]   [facility] [address], [place] [phone]   [facility]    [phone]   [facility]    [phone]   [facility] and [facility]  [address], [place] Phone:  [phone], Fax:  [phone] Hours of Operation:  9 am - 6 pm, M-F.  Also accepts Medicaid/Medicare and self-pay.  [facility]  [address], [place] Phone: [phone], Fax: [phone]. Hours of Operation:  8:30 am - 5:30 pm, M-F.  Also accepts Medicaid and self-pay.  [facility] [place] [address], [place] Phone: [phone]   [facility] [address] Natasha Bence [place], [place] [phone], Ext. [phone] Mondays & Thursdays: 7-9 AM.  First 15 patients are seen on a first come, first serve basis.    Medicaid-accepting [facility] Providers:  Organization         Address  Phone   Notes  [facility] [address], [place] [phone] Also accepts self-pay patients.  [facility] [address] Laurell Josephs [place], [place]  [phone]   [facility] [address], [place] [phone]   [facility] [address] [place] [phone]   Renaye Rakers [address], [place]   [phone] Only accepts [place] patients after they have their name applied to their card.   Self-Pay (no insurance) in [facility]:  Organization         Address  Phone   Notes  Sickle Cell Patients, [facility] [address] [place], [place] [phone]   [facility] [address] [place], [place] [phone]   [facility] [place]  [address], [place] [phone]   [facility]/Dr. Osei-Bonsu  [address] [place] or [address], [place] [phone] Phone number for both [place] and [place] locations is the same.  [facility]gent [facility] and [facility] [address] [place] [phone]   [facility] [address], [place] or [address] [phone]   [facility] [address] [place] [phone], phone; [phone], fax Sees patients 1st and 3rd Saturday of every month.  Must not qualify for public or private insurance (i.e. Medicaid, Medicare, [place] Health Choice, Veterans' Benefits)  Household income should be no more than 200% of the poverty level The clinic cannot treat you if you are pregnant or think you are pregnant  Sexually transmitted diseases are not treated at the clinic.

## 2018-04-01 NOTE — ED Triage Notes (Signed)
Pt c/o pain to left shoulder that started after moving 1 week ago-denies one specific injury-NAD-steady gait
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
## 2018-10-05 DIAGNOSIS — H40033 Anatomical narrow angle, bilateral: Secondary | ICD-10-CM | POA: Diagnosis not present

## 2018-10-05 DIAGNOSIS — H16223 Keratoconjunctivitis sicca, not specified as Sjogren's, bilateral: Secondary | ICD-10-CM | POA: Diagnosis not present

## 2019-01-18 ENCOUNTER — Ambulatory Visit: Payer: Medicaid Other

## 2019-04-27 DIAGNOSIS — Z114 Encounter for screening for human immunodeficiency virus [HIV]: Secondary | ICD-10-CM | POA: Diagnosis not present

## 2019-04-27 DIAGNOSIS — Z0000 Encounter for general adult medical examination without abnormal findings: Secondary | ICD-10-CM | POA: Diagnosis not present

## 2019-04-27 DIAGNOSIS — Z131 Encounter for screening for diabetes mellitus: Secondary | ICD-10-CM | POA: Diagnosis not present

## 2019-04-27 DIAGNOSIS — Z13220 Encounter for screening for lipoid disorders: Secondary | ICD-10-CM | POA: Diagnosis not present

## 2019-05-24 DIAGNOSIS — L700 Acne vulgaris: Secondary | ICD-10-CM | POA: Diagnosis not present

## 2019-07-06 DIAGNOSIS — J019 Acute sinusitis, unspecified: Secondary | ICD-10-CM | POA: Diagnosis not present

## 2019-08-10 DIAGNOSIS — L299 Pruritus, unspecified: Secondary | ICD-10-CM | POA: Diagnosis not present

## 2019-08-10 DIAGNOSIS — L578 Other skin changes due to chronic exposure to nonionizing radiation: Secondary | ICD-10-CM | POA: Diagnosis not present

## 2019-09-25 DIAGNOSIS — U071 COVID-19: Secondary | ICD-10-CM | POA: Diagnosis not present

## 2019-12-28 NOTE — Telephone Encounter (Signed)
NA

## 2020-05-31 DIAGNOSIS — H5213 Myopia, bilateral: Secondary | ICD-10-CM | POA: Diagnosis not present

## 2020-06-17 ENCOUNTER — Emergency Department (HOSPITAL_BASED_OUTPATIENT_CLINIC_OR_DEPARTMENT_OTHER)
Admission: EM | Admit: 2020-06-17 | Discharge: 2020-06-17 | Disposition: A | Discharge status: Home / Self Care | Payer: Medicaid Other | Attending: Emergency Medicine | Admitting: Emergency Medicine

## 2020-06-17 ENCOUNTER — Encounter (HOSPITAL_BASED_OUTPATIENT_CLINIC_OR_DEPARTMENT_OTHER): Payer: Self-pay | Admitting: *Deleted

## 2020-06-17 ENCOUNTER — Other Ambulatory Visit: Payer: Self-pay

## 2020-06-17 DIAGNOSIS — F17210 Nicotine dependence, cigarettes, uncomplicated: Secondary | ICD-10-CM | POA: Diagnosis not present

## 2020-06-17 DIAGNOSIS — L259 Unspecified contact dermatitis, unspecified cause: Secondary | ICD-10-CM | POA: Diagnosis not present

## 2020-06-17 DIAGNOSIS — R21 Rash and other nonspecific skin eruption: Secondary | ICD-10-CM | POA: Diagnosis present
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
## 2020-06-17 NOTE — ED Triage Notes (Signed)
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 Also states they are on her feet. States they are very itchy and she has been using calamine lotion

## 2020-06-17 NOTE — Discharge Instructions (Signed)
Please read instructions below. Take 25mg  of benadryl every 6 hours or daytime allergy medication such as claritin or zyrtec. Apply topical hydrocortisone cream to your rash to help with itching. Try to avoid scratching to prevent secondary bacterial infection. Schedule an appointment with your PCP to follow up on your visit today. Return to the ER immediately for feeling your throat closing, swelling of your lips or tongue, difficulty breathing, or new or concerning symptoms.

## 2020-06-18 ENCOUNTER — Telehealth: Payer: Self-pay

## 2020-06-18 NOTE — Telephone Encounter (Signed)
Transition Care Management Unsuccessful Follow-up Telephone Call  Date of discharge and from where:  06/17/2020 from [facility]  Attempts:  1st Attempt  Reason for unsuccessful TCM follow-up call:  Unable to reach patient

## 2020-06-19 DIAGNOSIS — L209 Atopic dermatitis, unspecified: Secondary | ICD-10-CM | POA: Diagnosis not present

## 2020-06-19 DIAGNOSIS — R5383 Other fatigue: Secondary | ICD-10-CM | POA: Diagnosis not present

## 2020-06-19 DIAGNOSIS — Z0000 Encounter for general adult medical examination without abnormal findings: Secondary | ICD-10-CM | POA: Diagnosis not present

## 2020-06-19 DIAGNOSIS — L309 Dermatitis, unspecified: Secondary | ICD-10-CM | POA: Diagnosis not present

## 2020-06-19 DIAGNOSIS — E559 Vitamin D deficiency, unspecified: Secondary | ICD-10-CM | POA: Diagnosis not present

## 2020-06-19 DIAGNOSIS — Z1159 Encounter for screening for other viral diseases: Secondary | ICD-10-CM | POA: Diagnosis not present

## 2020-06-19 DIAGNOSIS — R8290 Unspecified abnormal findings in urine: Secondary | ICD-10-CM | POA: Diagnosis not present

## 2020-06-19 NOTE — Telephone Encounter (Signed)
Transition Care Management Unsuccessful Follow-up Telephone Call  Date of discharge and from where:  06/17/2020 from [facility]  Attempts:  2nd Attempt  Reason for unsuccessful TCM follow-up call:  Unable to reach patient

## 2020-06-20 NOTE — Telephone Encounter (Signed)
Transition Care Management Unsuccessful Follow-up Telephone Call  Date of discharge and from where:  06/17/2020 from [facility]  Attempts:  3rd Attempt  Reason for unsuccessful TCM follow-up call:  Unable to reach patient

## 2020-07-10 DIAGNOSIS — L309 Dermatitis, unspecified: Secondary | ICD-10-CM | POA: Diagnosis not present

## 2020-07-10 DIAGNOSIS — Z789 Other specified health status: Secondary | ICD-10-CM | POA: Diagnosis not present

## 2020-07-10 DIAGNOSIS — E559 Vitamin D deficiency, unspecified: Secondary | ICD-10-CM | POA: Diagnosis not present

## 2020-07-28 DIAGNOSIS — F4310 Post-traumatic stress disorder, unspecified: Secondary | ICD-10-CM | POA: Diagnosis not present

## 2020-07-28 DIAGNOSIS — Z79899 Other long term (current) drug therapy: Secondary | ICD-10-CM | POA: Diagnosis not present

## 2020-07-28 DIAGNOSIS — F3281 Premenstrual dysphoric disorder: Secondary | ICD-10-CM | POA: Diagnosis not present

## 2020-07-28 DIAGNOSIS — F411 Generalized anxiety disorder: Secondary | ICD-10-CM | POA: Diagnosis not present

## 2020-07-28 DIAGNOSIS — F330 Major depressive disorder, recurrent, mild: Secondary | ICD-10-CM | POA: Diagnosis not present

## 2020-09-19 DIAGNOSIS — F4312 Post-traumatic stress disorder, chronic: Secondary | ICD-10-CM | POA: Diagnosis not present

## 2020-09-19 DIAGNOSIS — F411 Generalized anxiety disorder: Secondary | ICD-10-CM | POA: Diagnosis not present

## 2020-09-19 DIAGNOSIS — F330 Major depressive disorder, recurrent, mild: Secondary | ICD-10-CM | POA: Diagnosis not present

## 2020-09-26 DIAGNOSIS — G8929 Other chronic pain: Secondary | ICD-10-CM | POA: Diagnosis not present

## 2020-09-26 DIAGNOSIS — E559 Vitamin D deficiency, unspecified: Secondary | ICD-10-CM | POA: Diagnosis not present

## 2020-09-26 DIAGNOSIS — M25511 Pain in right shoulder: Secondary | ICD-10-CM | POA: Diagnosis not present

## 2020-09-26 DIAGNOSIS — F32A Depression, unspecified: Secondary | ICD-10-CM | POA: Diagnosis not present

## 2020-09-26 DIAGNOSIS — M797 Fibromyalgia: Secondary | ICD-10-CM | POA: Diagnosis not present

## 2020-09-26 DIAGNOSIS — M25512 Pain in left shoulder: Secondary | ICD-10-CM | POA: Diagnosis not present

## 2020-09-26 DIAGNOSIS — M542 Cervicalgia: Secondary | ICD-10-CM | POA: Diagnosis not present

## 2020-09-26 DIAGNOSIS — G4700 Insomnia, unspecified: Secondary | ICD-10-CM | POA: Diagnosis not present

## 2020-09-26 DIAGNOSIS — L209 Atopic dermatitis, unspecified: Secondary | ICD-10-CM | POA: Diagnosis not present

## 2020-10-17 DIAGNOSIS — F330 Major depressive disorder, recurrent, mild: Secondary | ICD-10-CM | POA: Diagnosis not present

## 2020-10-17 DIAGNOSIS — F3281 Premenstrual dysphoric disorder: Secondary | ICD-10-CM | POA: Diagnosis not present

## 2020-10-17 DIAGNOSIS — F4310 Post-traumatic stress disorder, unspecified: Secondary | ICD-10-CM | POA: Diagnosis not present

## 2020-10-17 DIAGNOSIS — F411 Generalized anxiety disorder: Secondary | ICD-10-CM | POA: Diagnosis not present

## 2020-10-19 ENCOUNTER — Ambulatory Visit: Payer: Medicaid Other | Attending: Adult Medicine

## 2020-10-19 ENCOUNTER — Other Ambulatory Visit: Payer: Self-pay

## 2020-10-19 DIAGNOSIS — M25512 Pain in left shoulder: Secondary | ICD-10-CM | POA: Insufficient documentation

## 2020-10-19 DIAGNOSIS — M25511 Pain in right shoulder: Secondary | ICD-10-CM | POA: Diagnosis present

## 2020-10-19 DIAGNOSIS — G8929 Other chronic pain: Secondary | ICD-10-CM | POA: Diagnosis present

## 2020-10-19 DIAGNOSIS — M436 Torticollis: Secondary | ICD-10-CM | POA: Insufficient documentation

## 2020-10-19 DIAGNOSIS — M6281 Muscle weakness (generalized): Secondary | ICD-10-CM | POA: Diagnosis present

## 2020-10-19 DIAGNOSIS — M542 Cervicalgia: Secondary | ICD-10-CM | POA: Diagnosis not present

## 2020-10-19 NOTE — Patient Instructions (Addendum)
      [id number]
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
## 2020-10-29 ENCOUNTER — Ambulatory Visit: Payer: Medicaid Other

## 2020-10-29 ENCOUNTER — Telehealth: Payer: Self-pay

## 2020-10-29 NOTE — Telephone Encounter (Signed)
Spoke with pt regarding her first no-show. Detailed the clinic attendance policy and confirmed the pt's next appointment.

## 2020-11-05 ENCOUNTER — Ambulatory Visit: Payer: Medicaid Other | Attending: Adult Medicine | Admitting: Physical Therapy

## 2020-11-05 DIAGNOSIS — G8929 Other chronic pain: Secondary | ICD-10-CM | POA: Insufficient documentation

## 2020-11-05 DIAGNOSIS — M436 Torticollis: Secondary | ICD-10-CM | POA: Insufficient documentation

## 2020-11-05 DIAGNOSIS — M25511 Pain in right shoulder: Secondary | ICD-10-CM | POA: Insufficient documentation

## 2020-11-05 DIAGNOSIS — M6281 Muscle weakness (generalized): Secondary | ICD-10-CM | POA: Insufficient documentation

## 2020-11-05 DIAGNOSIS — M542 Cervicalgia: Secondary | ICD-10-CM | POA: Insufficient documentation

## 2020-11-05 DIAGNOSIS — M25512 Pain in left shoulder: Secondary | ICD-10-CM | POA: Insufficient documentation

## 2020-11-12 ENCOUNTER — Telehealth: Payer: Self-pay

## 2020-11-12 ENCOUNTER — Ambulatory Visit: Payer: Medicaid Other

## 2020-11-12 NOTE — Telephone Encounter (Signed)
Left message for pt regarding her 2nd no show. Detailed clinic attendance policy and confirmed pt's remaining scheduled appointment. Provided clinic phone number if she needs to cancel or if she has any questions.

## 2020-11-19 ENCOUNTER — Other Ambulatory Visit: Payer: Self-pay

## 2020-11-19 ENCOUNTER — Ambulatory Visit: Payer: Medicaid Other | Admitting: Physical Therapy

## 2020-11-19 ENCOUNTER — Encounter: Payer: Self-pay | Admitting: Physical Therapy

## 2020-11-19 DIAGNOSIS — M6281 Muscle weakness (generalized): Secondary | ICD-10-CM

## 2020-11-19 DIAGNOSIS — M25512 Pain in left shoulder: Secondary | ICD-10-CM

## 2020-11-19 DIAGNOSIS — M542 Cervicalgia: Secondary | ICD-10-CM

## 2020-11-19 DIAGNOSIS — M436 Torticollis: Secondary | ICD-10-CM | POA: Diagnosis present

## 2020-11-19 DIAGNOSIS — G8929 Other chronic pain: Secondary | ICD-10-CM | POA: Diagnosis present

## 2020-11-19 DIAGNOSIS — M25511 Pain in right shoulder: Secondary | ICD-10-CM | POA: Diagnosis present
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
## 2020-11-30 ENCOUNTER — Ambulatory Visit: Payer: Medicaid Other

## 2020-12-26 DIAGNOSIS — G4700 Insomnia, unspecified: Secondary | ICD-10-CM | POA: Diagnosis not present

## 2020-12-26 DIAGNOSIS — E559 Vitamin D deficiency, unspecified: Secondary | ICD-10-CM | POA: Diagnosis not present

## 2020-12-26 DIAGNOSIS — G8929 Other chronic pain: Secondary | ICD-10-CM | POA: Diagnosis not present

## 2020-12-26 DIAGNOSIS — M25512 Pain in left shoulder: Secondary | ICD-10-CM | POA: Diagnosis not present

## 2020-12-26 DIAGNOSIS — R87610 Atypical squamous cells of undetermined significance on cytologic smear of cervix (ASC-US): Secondary | ICD-10-CM | POA: Diagnosis not present

## 2020-12-26 DIAGNOSIS — M797 Fibromyalgia: Secondary | ICD-10-CM | POA: Diagnosis not present

## 2020-12-26 DIAGNOSIS — F32A Depression, unspecified: Secondary | ICD-10-CM | POA: Diagnosis not present

## 2020-12-26 DIAGNOSIS — N898 Other specified noninflammatory disorders of vagina: Secondary | ICD-10-CM | POA: Diagnosis not present

## 2020-12-26 DIAGNOSIS — Z124 Encounter for screening for malignant neoplasm of cervix: Secondary | ICD-10-CM | POA: Diagnosis not present

## 2020-12-26 DIAGNOSIS — M542 Cervicalgia: Secondary | ICD-10-CM | POA: Diagnosis not present

## 2020-12-26 DIAGNOSIS — M25511 Pain in right shoulder: Secondary | ICD-10-CM | POA: Diagnosis not present

## 2020-12-26 DIAGNOSIS — L209 Atopic dermatitis, unspecified: Secondary | ICD-10-CM | POA: Diagnosis not present

## 2021-05-09 ENCOUNTER — Ambulatory Visit
Admission: EM | Admit: 2021-05-09 | Discharge: 2021-05-09 | Disposition: A | Discharge status: Home / Self Care | Payer: Medicaid Other | Attending: Internal Medicine | Admitting: Internal Medicine

## 2021-05-09 DIAGNOSIS — S39012A Strain of muscle, fascia and tendon of lower back, initial encounter: Secondary | ICD-10-CM | POA: Diagnosis not present

## 2021-05-09 DIAGNOSIS — M25511 Pain in right shoulder: Secondary | ICD-10-CM | POA: Diagnosis not present

## 2021-05-09 DIAGNOSIS — S161XXA Strain of muscle, fascia and tendon at neck level, initial encounter: Secondary | ICD-10-CM

## 2021-05-09 MED ORDER — BACLOFEN 10 MG PO TABS: ORAL_TABLET | ORAL | 0 refills | Status: DC
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
## 2021-05-09 NOTE — Discharge Instructions (Addendum)
You may ice areas for pain for 15 minutes at a time for 48 hours, then after that alternate with heat.  ? ?Follow up with your primary care doctor, a chiropractor and or physical therapy to help you heal faster.  ? ?You may take Tylenol up to 1000 mg every 6 hours as needed for pain  ?

## 2021-07-09 ENCOUNTER — Encounter: Payer: Self-pay | Admitting: *Deleted

## 2021-09-06 ENCOUNTER — Other Ambulatory Visit: Payer: Self-pay

## 2021-09-06 NOTE — Patient Instructions (Signed)
Norton Blizzard ,   The [facility] Managed Care Team is available to provide assistance to you with your healthcare needs at no cost and as a benefit of your [facility] Health plan. I'm sorry I was unable to reach you today for our scheduled appointment. Our care guide will call you to reschedule our telephone appointment. Please call me at the number below. I am available to be of assistance to you regarding your healthcare needs. .   Thank you,   Dickie La, BSW, MSW, LCSW Managed Medicaid LCSW [facility]  [address] [email or web address] Phone: [phone]

## 2021-09-06 NOTE — Patient Outreach (Signed)
[facility]) Care Management  09/06/2021  Alicia Pearson 29-Aug-1985 [id number]  LCSW completed [facility] outreach attempt today during scheduled appointment time but was unable to reach patient successfully. A HIPPA compliant voice message was left encouraging patient to return call once available. LCSW will ask Scheduling Care Guide to reschedule [facility] SW appointment with patient as well.  Dickie La, BSW, MSW, [employer] Managed Medicaid LCSW [facility]  [facility] [email or web address] Phone: [phone]

## 2021-09-09 ENCOUNTER — Other Ambulatory Visit: Payer: Self-pay | Admitting: Licensed Clinical Social Worker

## 2021-09-09 DIAGNOSIS — F4310 Post-traumatic stress disorder, unspecified: Secondary | ICD-10-CM

## 2021-09-09 NOTE — Patient Instructions (Signed)
Visit Information  Alicia Pearson was given information about Medicaid Managed Care team care coordination services as a part of their [employer] Community [employer] Medicaid benefit. Alicia Pearson verbally consented to engagement with the Medicaid Managed Care team.   If you are experiencing a medical emergency, please call 911 or report to your local emergency department or urgent care.   If you have a non-emergency medical problem during routine business hours, please contact your provider's office and ask to speak with a nurse.   For questions related to your [employer] Medicaid, please call: [phone] or visit the homepage here: [email or web address]  If you would like to schedule transportation through your [employer] Community [employer] Medicaid, please call the following number at least 2 days in advance of your appointment: [phone]   Rides for urgent appointments can also be made after hours by calling Member Services.  Call the Behavioral Health Crisis Line at [phone], at any time, 24 hours a day, 7 days a week. If you are in danger or need immediate medical attention call 911.  If you would like help to quit smoking, call 1-800-QUIT-NOW (1-800-784-8669) OR Espaol: 1-855-Djelo-Ya (1-855-335-3569) o para ms informacin haga clic aqu or Text READY to 200-400 to register via text  Following is a copy of your plan of care:  Care Plan : LCSW Plan of Care  Updates made by ,  L, LCSW since 09/09/2021 12:00 AM     Problem: Anxiety Identification (Anxiety)      Long-Range Goal: PTSD Symptoms Identified   Start Date: 09/09/2021  Priority: High  Note:   Priority: High  Timeframe:  Long-Range Goal Priority:  High Start Date:   09/09/21               Expected End Date:  ongoing                     Follow Up Date--09/17/21 at 9:15 am.  - keep 90 percent of scheduled  appointments -consider counseling or psychiatry -consider bumping up your self-care  -consider creating a stronger support network   Why is this important?             Beating depression and anxiety may take some time.            If you don't feel better right away, don't give up on your treatment plan.    Current barriers:   Chronic Mental Health needs related to PTSD. Patient requires Support, Education, Resources, Referrals, Advocacy, and Care Coordination, in order to meet Unmet Mental Health Needs. Patient will implement clinical interventions discussed today to decrease symptoms of PTSD and increase knowledge and/or ability of: coping skills. Mental Health Concerns and Social Isolation Patient lacks knowledge of available community counseling agencies and resources.  Clinical Goal(s): verbalize understanding of plan for management of PTSD and demonstrate a reduction in symptoms. Patient will connect with a provider for ongoing mental health treatment, increase coping skills, healthy habits, self-management skills, and stress reduction       Patient Goals/Self-Care Activities: Over the next 120 days Attend scheduled medical appointments Utilize healthy coping skills and supportive resources discussed Contact PCP with any questions or concerns Keep 90 percent of counseling appointments Call your insurance provider for more information about your Enhanced Benefits  Check out counseling resources provided  Begin personal counseling with LCSW, to reduce and manage symptoms of Depression and Stress, until well-established with mental health provider Accept all   calls from representative with [facility] in an effort to establish ongoing mental health counseling and supportive services. Incorporate into daily practice - relaxation techniques, deep breathing exercises, and mindfulness meditation strategies. Talk about feelings with friends, family members, spiritual advisor, etc. Contact LCSW  directly (#[phone]), if you have questions, need assistance, or if additional social work needs are identified between now and our next scheduled telephone outreach call. Call 988 for mental health hotline/crisis line if needed (24/7 available) Try techniques to reduce symptoms of anxiety/negative thinking (deep breathing, distraction, positive self talk, etc)  - develop a personal safety plan - develop a plan to deal with triggers like holidays, anniversaries - exercise at least 2 to 3 times per week - have a plan for how to handle bad days - journal feelings and what helps to feel better or worse - spend time or talk with others at least 2 to 3 times per week - watch for early signs of feeling worse - begin personal counseling - call and visit an old friend - check out volunteer opportunities - join a support group - laugh; watch a funny movie or comedian - learn and use visualization or guided imagery - perform a random act of kindness - practice relaxation or meditation daily - start or continue a personal journal - practice positive thinking and self-talk -continue with compliance of taking medication  -identify current effective and ineffective coping strategies.  -implement positive self-talk in care to increase self-esteem, confidence and feelings of control.  -consider alternative and complementary therapy approaches such as meditation, mindfulness or yoga.  -journaling, prayer, worship services, meditation or pastoral counseling.  -increase participation in pleasurable group activities such as hobbies, singing, sports or volunteering).  -consider the use of meditative movement therapy such as tai chi, yoga or qigong.  -start a regular daily exercise program based on tolerance, ability and patient choice to support positive thinking and activity    If you are experiencing a Mental Health or Behavioral Health Crisis or need someone to talk to, please call the Suicide and  Crisis Lifeline: 988   The following coping skill education was provided for stress relief and mental health management: "When your car dies or a deadline looms, how do you respond? Long-term, low-grade or acute stress takes a serious toll on your body and mind, so don't ignore feelings of constant tension. Stress is a natural part of life. However, too much stress can harm our health, especially if it continues every day. This is chronic stress and can put you at risk for heart problems like heart disease and depression. Understand what's happening inside your body and learn simple coping skills to combat the negative impacts of everyday stressors.  Types of Stress There are two types of stress: Emotional - types of emotional stress are relationship problems, pressure at work, financial worries, experiencing discrimination or having a major life change. Physical - Examples of physical stress include being sick having pain, not sleeping well, recovery from an injury or having an alcohol and drug use disorder. Fight or Flight Sudden or ongoing stress activates your nervous system and floods your bloodstream with adrenaline and cortisol, two hormones that raise blood pressure, increase heart rate and spike blood sugar. These changes pitch your body into a fight or flight response. That enabled our ancestors to outrun saber-toothed tigers, and it's helpful today for situations like dodging a car accident. But most modern chronic stressors, such as finances or a challenging relationship, keep your body   in that heightened state, which hurts your health. Effects of Too Much Stress If constantly under stress, most of [place] will eventually start to function less well.  Multiple studies link chronic stress to a higher risk of heart disease, stroke, depression, weight gain, memory loss and even premature death, so it's important to recognize the warning signals. Talk to your doctor about ways to manage stress if  you're experiencing any of these symptoms: Prolonged periods of poor sleep. Regular, severe headaches. Unexplained weight loss or gain. Feelings of isolation, withdrawal or worthlessness. Constant anger and irritability. Loss of interest in activities. Constant worrying or obsessive thinking. Excessive alcohol or drug use. Inability to concentrate.  10 Ways to Cope with Chronic Stress It's key to recognize stressful situations as they occur because it allows you to focus on managing how you react. We all need to know when to close our eyes and take a deep breath when we feel tension rising. Use these tips to prevent or reduce chronic stress. 1. Rebalance Work and Home All work and no play? If you're spending too much time at the office, intentionally put more dates in your calendar to enjoy time for fun, either alone or with others. 2. Get Regular Exercise Moving your body on a regular basis balances the nervous system and increases blood circulation, helping to flush out stress hormones. Even a daily 20-minute walk makes a difference. Any kind of exercise can lower stress and improve your mood ? just pick activities that you enjoy and make it a regular habit. 3. Eat Well and Limit Alcohol and Stimulants Alcohol, nicotine and caffeine may temporarily relieve stress but have negative health impacts and can make stress worse in the long run. Well-nourished bodies cope better, so start with a good breakfast, add more organic fruits and vegetables for a well-balanced diet, avoid processed foods and sugar, try herbal tea and drink more water. 4. Connect with Supportive People Talking face to face with another person releases hormones that reduce stress. Lean on those good listeners in your life. 5. [place] Time Do you enjoy gardening, reading, listening to music or some other creative pursuit? Engage in activities that bring you pleasure and joy; research shows that reduces stress by almost  half and lowers your heart rate, too. 6. Practice Meditation, Stress Reduction or Yoga Relaxation techniques activate a state of restfulness that counterbalances your body's fight-or-flight hormones. Even if this also means a 10-minute break in a long day: listen to music, read, go for a walk in nature, do a hobby, take a bath or spend time with a friend. Also consider doing a mindfulness exercise or try a daily deep breathing or imagery practice. Deep Breathing Slow, calm and deep breathing can help you relax. Try these steps to focus on your breathing and repeat as needed. Find a comfortable position and close your eyes. Exhale and drop your shoulders. Breathe in through your nose; fill your lungs and then your belly. Think of relaxing your body, quieting your mind and becoming calm and peaceful. Breathe out slowly through your nose, relaxing your belly. Think of releasing tension, pain, worries or distress. Repeat steps three and four until you feel relaxed. Imagery This involves using your mind to excite the senses -- sound, vision, smell, taste and feeling. This may help ease your stress. Begin by getting comfortable and then do some slow breathing. Imagine a place you love being at. It could be somewhere from your childhood, somewhere you  vacationed or just a place in your imagination. Feel how it is to be in the place you're imagining. Pay attention to the sounds, air, colors, and who is there with you. This is a place where you feel cared for and loved. All is well. You are safe. Take in all the smells, sounds, tastes and feelings. As you do, feel your body being nourished and healed. Feel the calm that surrounds you. Breathe in all the good. Breathe out any discomfort or tension. 7. Sleep Enough If you get less than seven to eight hours of sleep, your body won't tolerate stress as well as it could. If stress keeps you up at night, address the cause, and add extra meditation into your day  to make up for the lost z's. Try to get seven to nine hours of sleep each night. Make a regular bedtime schedule. Keep your room dark and cool. Try to avoid computers, TV, cell phones and tablets before bed. 8. Bond with Connections You Enjoy Go out for a coffee with a friend, chat with a neighbor, call a family member, visit with a clergy member, or even hang out with your pet. Clinical studies show that spending even a short time with a companion animal can cut anxiety levels almost in half. 9. Take a Vacation Getting away from it all can reset your stress tolerance by increasing your mental and emotional outlook, which makes you a happier, more productive person upon return. Leave your cellphone and laptop at home! 10. See a Counselor, Coach or Therapist If negative thoughts overwhelm your ability to make positive changes, it's time to seek professional help. Make an appointment today--your health and life are worth it."     24- Hour Availability:    [facility]  [address] [place], [place] Front Line [phone] Crisis [phone]   [facility]  Crisis Line [phone]   [facility]  [phone]    [facility]  [phone] (after hours)   Therapeutic Alternative/Mobile Crisis   1-877-626-1772   [place] National Suicide Hotline  1-800-273-8255 (TALK) OR 988   Call 911 or go to emergency room   [place] Crisis Line  (800-256-2452);  [place]    [facility] ACCESS  (800-939-5911); [place], [place], [place], [place], [place], [place], [place]          Patient Goals: Initial goal     , BSW, MSW, LCSW Managed Medicaid LCSW [place]  [email or web address] Phone: [phone]   
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
## 2021-09-10 ENCOUNTER — Other Ambulatory Visit: Payer: Self-pay

## 2021-09-13 ENCOUNTER — Other Ambulatory Visit: Payer: Self-pay

## 2021-09-13 NOTE — Patient Instructions (Signed)
Visit Information  Alicia Pearson was given information about Medicaid Managed Care team care coordination services as a part of their [facility] Community Plan Medicaid benefit. Alicia Pearson verbally consented to engagement with the [facility] Managed Care team.   If you are experiencing a medical emergency, please call 911 or report to your local emergency department or urgent care.   If you have a non-emergency medical problem during routine business hours, please contact your provider's office and ask to speak with a nurse.   For questions related to your [facility], please call: [phone] or visit the homepage here: [email or web address]  If you would like to schedule transportation through your [facility], please call the following number at least 2 days in advance of your appointment: [phone]   Rides for urgent appointments can also be made after hours by calling Member Services.  Call the Behavioral Health Crisis Line at [phone], at any time, 24 hours a day, 7 days a week. If you are in danger or need immediate medical attention call 911.  If you would like help to quit smoking, call 1-800-QUIT-NOW ((732)325-0119) OR Espaol: 1-855-Djelo-Ya (3-614-431-5400) o para ms informacin haga clic aqu or Text READY to 867-619 to register via text  Alicia Pearson - following are the goals we discussed in your visit today:   Goals Addressed   None       Social Worker will follow up in 14 days .   Gus Puma, BSW, [place] [facility]  [place]  High Risk Managed Medicaid Team  [phone]   Following is a copy of your plan of care:  There are no care plans that you recently modified to display for this patient.
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
## 2021-09-17 ENCOUNTER — Other Ambulatory Visit: Payer: Self-pay | Admitting: Licensed Clinical Social Worker

## 2021-09-17 NOTE — Patient Instructions (Signed)
Visit Information  Ms. Hottenstein was given information about Medicaid Managed Care team care coordination services as a part of their [place] Medicaid benefit. [name] verbally consented to engagement with the [facility] Managed Care team.   If you are experiencing a medical emergency, please call 911 or report to your local emergency department or urgent care.   If you have a non-emergency medical problem during routine business hours, please contact your provider's office and ask to speak with a nurse.   For questions related to your [facility], please call: [phone] or visit the homepage here: [email or web address]  If you would like to schedule transportation through your [facility], please call the following number at least 2 days in advance of your appointment: [phone]   Rides for urgent appointments can also be made after hours by calling Member Services.  Call the [place] at [phone], at any time, 24 hours a day, 7 days a week. If you are in danger or need immediate medical attention call 911.  If you would like help to quit smoking, call 1-800-QUIT-NOW 7183294934) OR Espaol: 1-855-Djelo-Ya (3-785-885-0277) o para ms informacin haga clic aqu or Text READY to 200-400 to register via text  Following is a copy of your plan of care:  Care Plan : LCSW Plan of Care  Updates made by Greg Cutter, LCSW since 09/17/2021 12:00 AM     Problem: Anxiety Identification (Anxiety)      Long-Range Goal: PTSD Symptoms Identified   Start Date: 09/09/2021  Priority: High  Note:   Priority: High  Timeframe:  Long-Range Goal Priority:  High Start Date:   09/09/21               Expected End Date:  ongoing                     Follow Up Date--10/01/21 at 9:15 am.  - keep 90 percent of scheduled  appointments -consider counseling or psychiatry -consider bumping up your self-care  -consider creating a stronger support network   Why is this important?             Beating depression and anxiety may take some time.            If you don't feel better right away, don't give up on your treatment plan.    Current barriers:   Chronic Mental Health needs related to PTSD. Patient requires Support, Education, Resources, Referrals, Advocacy, and Care Coordination, in order to meet Unmet Mental Health Needs. Patient will implement clinical interventions discussed today to decrease symptoms of PTSD and increase knowledge and/or ability of: coping skills. Mental Health Concerns and Social Isolation Patient lacks knowledge of available community counseling agencies and resources.  Clinical Goal(s): verbalize understanding of plan for management of PTSD and demonstrate a reduction in symptoms. Patient will connect with a provider for ongoing mental health treatment, increase coping skills, healthy habits, self-management skills, and stress reduction        Patient Goals/Self-Care Activities: Over the next 120 days Attend scheduled medical appointments Utilize healthy coping skills and supportive resources discussed Contact PCP with any questions or concerns Keep 90 percent of counseling appointments Call your insurance provider for more information about your Enhanced Benefits  Check out counseling resources provided  Begin personal counseling with LCSW, to reduce and manage symptoms of Depression and Stress, until well-established with mental health provider Accept  all calls from representative with [facility] in an effort to establish ongoing mental health counseling and supportive services. Incorporate into daily practice - relaxation techniques, deep breathing exercises, and mindfulness meditation strategies. Talk about feelings with friends, family members, spiritual advisor, etc. Contact LCSW  directly [phone]), if you have questions, need assistance, or if additional social work needs are identified between now and our next scheduled telephone outreach call. Call 988 for mental health hotline/crisis line if needed (24/7 available) Try techniques to reduce symptoms of anxiety/negative thinking (deep breathing, distraction, positive self talk, etc)  - develop a personal safety plan - develop a plan to deal with triggers like holidays, anniversaries - exercise at least 2 to 3 times per week - have a plan for how to handle bad days - journal feelings and what helps to feel better or worse - spend time or talk with others at least 2 to 3 times per week - watch for early signs of feeling worse - begin personal counseling - call and visit an old friend - check out volunteer opportunities - join a support group - laugh; watch a funny movie or comedian - learn and use visualization or guided imagery - perform a random act of kindness - practice relaxation or meditation daily - start or continue a personal journal - practice positive thinking and self-talk -continue with compliance of taking medication  -identify current effective and ineffective coping strategies.  -implement positive self-talk in care to increase self-esteem, confidence and feelings of control.  -consider alternative and complementary therapy approaches such as meditation, mindfulness or yoga.  -journaling, prayer, worship services, meditation or pastoral counseling.  -increase participation in pleasurable group activities such as hobbies, singing, sports or volunteering).  -consider the use of meditative movement therapy such as tai chi, yoga or qigong.  -start a regular daily exercise program based on tolerance, ability and patient choice to support positive thinking and activity    If you are experiencing a Mental Health or [place] or need someone to talk to, please call the Suicide and  Crisis Lifeline: 988   The following coping skill education was provided for stress relief and mental health management: "When your car dies or a deadline looms, how do you respond? Long-term, low-grade or acute stress takes a serious toll on your body and mind, so don't ignore feelings of constant tension. Stress is a natural part of life. However, too much stress can harm our health, especially if it continues every day. This is chronic stress and can put you at risk for heart problems like heart disease and depression. Understand what's happening inside your body and learn simple coping skills to combat the negative impacts of everyday stressors.  Types of Stress There are two types of stress: Emotional - types of emotional stress are relationship problems, pressure at work, financial worries, experiencing discrimination or having a major life change. Physical - Examples of physical stress include being sick having pain, not sleeping well, recovery from an injury or having an alcohol and drug use disorder. Fight or Flight Sudden or ongoing stress activates your nervous system and floods your bloodstream with adrenaline and cortisol, two hormones that raise blood pressure, increase heart rate and spike blood sugar. These changes pitch your body into a fight or flight response. That enabled our ancestors to outrun saber-toothed tigers, and it's helpful today for situations like dodging a car accident. But most modern chronic stressors, such as finances or a challenging relationship, keep your  body in that heightened state, which hurts your health. Effects of Too Much Stress If constantly under stress, most of [place] will eventually start to function less well.  Multiple studies link chronic stress to a higher risk of heart disease, stroke, depression, weight gain, memory loss and even premature death, so it's important to recognize the warning signals. Talk to your doctor about ways to manage stress if  you're experiencing any of these symptoms: Prolonged periods of poor sleep. Regular, severe headaches. Unexplained weight loss or gain. Feelings of isolation, withdrawal or worthlessness. Constant anger and irritability. Loss of interest in activities. Constant worrying or obsessive thinking. Excessive alcohol or drug use. Inability to concentrate.  10 Ways to Cope with Chronic Stress It's key to recognize stressful situations as they occur because it allows you to focus on managing how you react. We all need to know when to close our eyes and take a deep breath when we feel tension rising. Use these tips to prevent or reduce chronic stress. 1. Rebalance Work and Home All work and no play? If you're spending too much time at the office, intentionally put more dates in your calendar to enjoy time for fun, either alone or with others. 2. Get Regular Exercise Moving your body on a regular basis balances the nervous system and increases blood circulation, helping to flush out stress hormones. Even a daily 20-minute walk makes a difference. Any kind of exercise can lower stress and improve your mood ? just pick activities that you enjoy and make it a regular habit. 3. Eat Well and Limit Alcohol and Stimulants Alcohol, nicotine and caffeine may temporarily relieve stress but have negative health impacts and can make stress worse in the long run. Well-nourished bodies cope better, so start with a good breakfast, add more organic fruits and vegetables for a well-balanced diet, avoid processed foods and sugar, try herbal tea and drink more water. 4. Connect with Supportive People Talking face to face with another person releases hormones that reduce stress. Lean on those good listeners in your life. 5. [place] Time Do you enjoy gardening, reading, listening to music or some other creative pursuit? Engage in activities that bring you pleasure and joy; research shows that reduces stress by almost  half and lowers your heart rate, too. 6. Practice Meditation, Stress Reduction or Yoga Relaxation techniques activate a state of restfulness that counterbalances your body's fight-or-flight hormones. Even if this also means a 10-minute break in a long day: listen to music, read, go for a walk in nature, do a hobby, take a bath or spend time with a friend. Also consider doing a mindfulness exercise or try a daily deep breathing or imagery practice. Deep Breathing Slow, calm and deep breathing can help you relax. Try these steps to focus on your breathing and repeat as needed. Find a comfortable position and close your eyes. Exhale and drop your shoulders. Breathe in through your nose; fill your lungs and then your belly. Think of relaxing your body, quieting your mind and becoming calm and peaceful. Breathe out slowly through your nose, relaxing your belly. Think of releasing tension, pain, worries or distress. Repeat steps three and four until you feel relaxed. Imagery This involves using your mind to excite the senses -- sound, vision, smell, taste and feeling. This may help ease your stress. Begin by getting comfortable and then do some slow breathing. Imagine a place you love being at. It could be somewhere from your childhood, somewhere  you vacationed or just a place in your imagination. Feel how it is to be in the place you're imagining. Pay attention to the sounds, air, colors, and who is there with you. This is a place where you feel cared for and loved. All is well. You are safe. Take in all the smells, sounds, tastes and feelings. As you do, feel your body being nourished and healed. Feel the calm that surrounds you. Breathe in all the good. Breathe out any discomfort or tension. 7. Sleep Enough If you get less than seven to eight hours of sleep, your body won't tolerate stress as well as it could. If stress keeps you up at night, address the cause, and add extra meditation into your day  to make up for the lost z's. Try to get seven to nine hours of sleep each night. Make a regular bedtime schedule. Keep your room dark and cool. Try to avoid computers, TV, cell phones and tablets before bed. 8. Bond with Connections You Enjoy Go out for a coffee with a friend, chat with a neighbor, call a family member, visit with a clergy member, or even hang out with your pet. Clinical studies show that spending even a short time with a companion animal can cut anxiety levels almost in half. 9. Take a Vacation Getting away from it all can reset your stress tolerance by increasing your mental and emotional outlook, which makes you a happier, more productive person upon return. Leave your cellphone and laptop at home! 10. See a Counselor, Coach or Therapist If negative thoughts overwhelm your ability to make positive changes, it's time to seek professional help. Make an appointment today--your health and life are worth it."     24- Hour Availability:    [facility]  [address] [place], [place] Crisis [phone]   [facility] [employer] [phone]   [place]  [phone]    [place]  [phone] (after hours)   Therapeutic Alternative/Mobile Crisis   [phone]   [place] National Suicide Hotline  775-292-1791 Diamantina Monks) [place] 988   Call 911 or go to emergency room   [facility]  [phone]);  [place] and [employer]  [phone]); [place], [place], [place], [place], [place], [place], [place]          Patient Goals: Follow up goal  Eula Fried, BSW, MSW, [employer] Managed Medicaid LCSW [email or web address] Phone: [phone]

## 2021-09-17 NOTE — Addendum Note (Signed)
Addended by: Gustavus Bryant on: 09/17/2021 09:27 AM   Modules accepted: Orders
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
## 2021-10-01 ENCOUNTER — Other Ambulatory Visit: Payer: Self-pay | Admitting: Licensed Clinical Social Worker
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
## 2021-10-01 NOTE — Patient Instructions (Signed)
Visit Information  Alicia Pearson was given information about Medicaid Managed Care team care coordination services as a part of their [place] Medicaid benefit. Alicia Pearson verbally consented to engagement with the [facility] Managed Care team.   If you are experiencing a medical emergency, please call 911 or report to your local emergency department or urgent care.   If you have a non-emergency medical problem during routine business hours, please contact your provider's office and ask to speak with a nurse.   For questions related to your [facility], please call: [phone] or visit the homepage here: [email or web address]  If you would like to schedule transportation through your [facility], please call the following number at least 2 days in advance of your appointment: [phone]   Rides for urgent appointments can also be made after hours by calling Member Services.  Call the [place] at [phone], at any time, 24 hours a day, 7 days a week. If you are in danger or need immediate medical attention call 911.  If you would like help to quit smoking, call 1-800-QUIT-NOW (682)426-7690) OR Espaol: 1-855-Djelo-Ya (8-676-195-0932) o para ms informacin haga clic aqu or Text READY to 200-400 to register via text  Following is a copy of your plan of care:  Care Plan : LCSW Plan of Care  Updates made by Greg Cutter, LCSW since 10/01/2021 12:00 AM     Problem: Anxiety Identification (Anxiety)      Long-Range Goal: PTSD Symptoms Identified   Start Date: 09/09/2021  Priority: High  Note:   Priority: High  Timeframe:  Long-Range Goal Priority:  High Start Date:   09/09/21               Expected End Date:  ongoing                     Follow Up Date--10/15/21 at 9:30 am.  - keep 90 percent of scheduled  appointments -consider counseling or psychiatry -consider bumping up your self-care  -consider creating a stronger support network   Why is this important?             Beating depression and anxiety may take some time.            If you don't feel better right away, don't give up on your treatment plan.    Current barriers:   Chronic Mental Health needs related to PTSD. Patient requires Support, Education, Resources, Referrals, Advocacy, and Care Coordination, in order to meet Unmet Mental Health Needs. Patient will implement clinical interventions discussed today to decrease symptoms of PTSD and increase knowledge and/or ability of: coping skills. Mental Health Concerns and Social Isolation Patient lacks knowledge of available community counseling agencies and resources.  Clinical Goal(s): verbalize understanding of plan for management of PTSD and demonstrate a reduction in symptoms. Patient will connect with a provider for ongoing mental health treatment, increase coping skills, healthy habits, self-management skills, and stress reduction        Patient Goals/Self-Care Activities: Over the next 120 days Attend scheduled medical appointments Utilize healthy coping skills and supportive resources discussed Contact PCP with any questions or concerns Keep 90 percent of counseling appointments Call your insurance provider for more information about your Enhanced Benefits  Check out counseling resources provided  Begin personal counseling with LCSW, to reduce and manage symptoms of Depression and Stress, until well-established with mental health provider Accept  all calls from representative with [facility] in an effort to establish ongoing mental health counseling and supportive services. Incorporate into daily practice - relaxation techniques, deep breathing exercises, and mindfulness meditation strategies. Talk about feelings with friends, family members, spiritual advisor, etc. Contact LCSW  directly [phone]), if you have questions, need assistance, or if additional social work needs are identified between now and our next scheduled telephone outreach call. Call 988 for mental health hotline/crisis line if needed (24/7 available) Try techniques to reduce symptoms of anxiety/negative thinking (deep breathing, distraction, positive self talk, etc)  - develop a personal safety plan - develop a plan to deal with triggers like holidays, anniversaries - exercise at least 2 to 3 times per week - have a plan for how to handle bad days - journal feelings and what helps to feel better or worse - spend time or talk with others at least 2 to 3 times per week - watch for early signs of feeling worse - begin personal counseling - call and visit an old friend - check out volunteer opportunities - join a support group - laugh; watch a funny movie or comedian - learn and use visualization or guided imagery - perform a random act of kindness - practice relaxation or meditation daily - start or continue a personal journal - practice positive thinking and self-talk -continue with compliance of taking medication  -identify current effective and ineffective coping strategies.  -implement positive self-talk in care to increase self-esteem, confidence and feelings of control.  -consider alternative and complementary therapy approaches such as meditation, mindfulness or yoga.  -journaling, prayer, worship services, meditation or pastoral counseling.  -increase participation in pleasurable group activities such as hobbies, singing, sports or volunteering).  -consider the use of meditative movement therapy such as tai chi, yoga or qigong.  -start a regular daily exercise program based on tolerance, ability and patient choice to support positive thinking and activity    If you are experiencing a Mental Health or [place] or need someone to talk to, please call the Suicide and  Crisis Lifeline: 988   The following coping skill education was provided for stress relief and mental health management: "When your car dies or a deadline looms, how do you respond? Long-term, low-grade or acute stress takes a serious toll on your body and mind, so don't ignore feelings of constant tension. Stress is a natural part of life. However, too much stress can harm our health, especially if it continues every day. This is chronic stress and can put you at risk for heart problems like heart disease and depression. Understand what's happening inside your body and learn simple coping skills to combat the negative impacts of everyday stressors.  Types of Stress There are two types of stress: Emotional - types of emotional stress are relationship problems, pressure at work, financial worries, experiencing discrimination or having a major life change. Physical - Examples of physical stress include being sick having pain, not sleeping well, recovery from an injury or having an alcohol and drug use disorder. Fight or Flight Sudden or ongoing stress activates your nervous system and floods your bloodstream with adrenaline and cortisol, two hormones that raise blood pressure, increase heart rate and spike blood sugar. These changes pitch your body into a fight or flight response. That enabled our ancestors to outrun saber-toothed tigers, and it's helpful today for situations like dodging a car accident. But most modern chronic stressors, such as finances or a challenging relationship, keep your  body in that heightened state, which hurts your health. Effects of Too Much Stress If constantly under stress, most of [place] will eventually start to function less well.  Multiple studies link chronic stress to a higher risk of heart disease, stroke, depression, weight gain, memory loss and even premature death, so it's important to recognize the warning signals. Talk to your doctor about ways to manage stress if  you're experiencing any of these symptoms: Prolonged periods of poor sleep. Regular, severe headaches. Unexplained weight loss or gain. Feelings of isolation, withdrawal or worthlessness. Constant anger and irritability. Loss of interest in activities. Constant worrying or obsessive thinking. Excessive alcohol or drug use. Inability to concentrate.  10 Ways to Cope with Chronic Stress It's key to recognize stressful situations as they occur because it allows you to focus on managing how you react. We all need to know when to close our eyes and take a deep breath when we feel tension rising. Use these tips to prevent or reduce chronic stress. 1. Rebalance Work and Home All work and no play? If you're spending too much time at the office, intentionally put more dates in your calendar to enjoy time for fun, either alone or with others. 2. Get Regular Exercise Moving your body on a regular basis balances the nervous system and increases blood circulation, helping to flush out stress hormones. Even a daily 20-minute walk makes a difference. Any kind of exercise can lower stress and improve your mood ? just pick activities that you enjoy and make it a regular habit. 3. Eat Well and Limit Alcohol and Stimulants Alcohol, nicotine and caffeine may temporarily relieve stress but have negative health impacts and can make stress worse in the long run. Well-nourished bodies cope better, so start with a good breakfast, add more organic fruits and vegetables for a well-balanced diet, avoid processed foods and sugar, try herbal tea and drink more water. 4. Connect with Supportive People Talking face to face with another person releases hormones that reduce stress. Lean on those good listeners in your life. 5. [place] Time Do you enjoy gardening, reading, listening to music or some other creative pursuit? Engage in activities that bring you pleasure and joy; research shows that reduces stress by almost  half and lowers your heart rate, too. 6. Practice Meditation, Stress Reduction or Yoga Relaxation techniques activate a state of restfulness that counterbalances your body's fight-or-flight hormones. Even if this also means a 10-minute break in a long day: listen to music, read, go for a walk in nature, do a hobby, take a bath or spend time with a friend. Also consider doing a mindfulness exercise or try a daily deep breathing or imagery practice. Deep Breathing Slow, calm and deep breathing can help you relax. Try these steps to focus on your breathing and repeat as needed. Find a comfortable position and close your eyes. Exhale and drop your shoulders. Breathe in through your nose; fill your lungs and then your belly. Think of relaxing your body, quieting your mind and becoming calm and peaceful. Breathe out slowly through your nose, relaxing your belly. Think of releasing tension, pain, worries or distress. Repeat steps three and four until you feel relaxed. Imagery This involves using your mind to excite the senses -- sound, vision, smell, taste and feeling. This may help ease your stress. Begin by getting comfortable and then do some slow breathing. Imagine a place you love being at. It could be somewhere from your childhood, somewhere  you vacationed or just a place in your imagination. Feel how it is to be in the place you're imagining. Pay attention to the sounds, air, colors, and who is there with you. This is a place where you feel cared for and loved. All is well. You are safe. Take in all the smells, sounds, tastes and feelings. As you do, feel your body being nourished and healed. Feel the calm that surrounds you. Breathe in all the good. Breathe out any discomfort or tension. 7. Sleep Enough If you get less than seven to eight hours of sleep, your body won't tolerate stress as well as it could. If stress keeps you up at night, address the cause, and add extra meditation into your day  to make up for the lost z's. Try to get seven to nine hours of sleep each night. Make a regular bedtime schedule. Keep your room dark and cool. Try to avoid computers, TV, cell phones and tablets before bed. 8. Bond with Connections You Enjoy Go out for a coffee with a friend, chat with a neighbor, call a family member, visit with a clergy member, or even hang out with your pet. Clinical studies show that spending even a short time with a companion animal can cut anxiety levels almost in half. 9. Take a Vacation Getting away from it all can reset your stress tolerance by increasing your mental and emotional outlook, which makes you a happier, more productive person upon return. Leave your cellphone and laptop at home! 10. See a Counselor, Coach or Therapist If negative thoughts overwhelm your ability to make positive changes, it's time to seek professional help. Make an appointment today--your health and life are worth it."     24- Hour Availability:    [facility]  [address] [place], [place] [facility] [phone]   [facility] [employer] [phone]   [place]  [phone]    [place]  [phone] (after hours)   Therapeutic Alternative/Mobile Crisis   3197776185   [place] National Suicide Hotline  810-792-4912 Diamantina Monks) [place] 988   Call 911 or go to emergency room   [facility]  [phone]);  [place] and [employer]  [phone]); [place], [place], [place], [place], [place], [place], [place]          Patient Goals: Follow up goal  Eula Fried, BSW, MSW, [employer] Managed Medicaid LCSW [email or web address] Phone: [phone]

## 2021-10-03 ENCOUNTER — Other Ambulatory Visit: Payer: Self-pay
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
## 2021-10-03 NOTE — Patient Instructions (Signed)
Visit Information  Alicia Pearson was given information about Medicaid Managed Care team care coordination services as a part of their [place] Medicaid benefit. Alicia Pearson verbally consented to engagement with the [facility] Managed Care team.   If you are experiencing a medical emergency, please call 911 or report to your local emergency department or urgent care.   If you have a non-emergency medical problem during routine business hours, please contact your provider's office and ask to speak with a nurse.   For questions related to your [facility], please call: [phone] or visit the homepage here: [email or web address]  If you would like to schedule transportation through your [facility], please call the following number at least 2 days in advance of your appointment: [phone]   Rides for urgent appointments can also be made after hours by calling Member Services.  Call the [place] at [phone], at any time, 24 hours a day, 7 days a week. If you are in danger or need immediate medical attention call 911.  If you would like help to quit smoking, call 1-800-QUIT-NOW 604-332-5670) OR Espaol: 1-855-Djelo-Ya (7-711-657-9038) o para ms informacin haga clic aqu or Text READY to 200-400 to register via text  Alicia Pearson - following are the goals we discussed in your visit today:   Goals Addressed   None       Social Worker will follow up in 60 days .   Alicia Pearson, BSW, [place]  High Risk Managed Medicaid Team  [phone]   Following is a copy of your plan of care:  Care Plan : LCSW Plan of Care  Updates made by Ethelda Chick since 10/03/2021 12:00 AM     Problem: Anxiety Identification (Anxiety)      Long-Range Goal: PTSD Symptoms Identified   Start Date:  09/09/2021  Priority: High  Note:   Priority: High  Timeframe:  Long-Range Goal Priority:  High Start Date:   09/09/21               Expected End Date:  ongoing                     Follow Up Date--10/15/21 at 9:30 am.  - keep 90 percent of scheduled appointments -consider counseling or psychiatry -consider bumping up your self-care  -consider creating a stronger support network   Why is this important?             Beating depression and anxiety may take some time.            If you don't feel better right away, don't give up on your treatment plan.    Current barriers:   Chronic Mental Health needs related to PTSD. Patient requires Support, Education, Resources, Referrals, Advocacy, and Care Coordination, in order to meet Unmet Mental Health Needs. Patient will implement clinical interventions discussed today to decrease symptoms of PTSD and increase knowledge and/or ability of: coping skills. Mental Health Concerns and Social Isolation Patient lacks knowledge of available community counseling agencies and resources.  Clinical Goal(s): verbalize understanding of plan for management of PTSD and demonstrate a reduction in symptoms. Patient will connect with a provider for ongoing mental health treatment, increase coping skills, healthy habits, self-management skills, and stress reduction        Clinical Interventions:  Assessed patient's previous and current treatment, coping skills, support system and barriers  to care. Patient provided hx  Verbalization of feelings encouraged, motivational interviewing employed Emotional support provided, positive coping strategies explored Self care/establishing healthy boundaries emphasized Patient was educated on available mental health resources within their area that accept Medicaid and offer counseling and psychiatry. Patient reports that she has PTSD and is in need of creating a mental health support. She wishes to gain both psychiatry and  counseling. She reports needs education on skills to implement into her daily routine to reduce her PTSD symptoms.  Patient reports significant worsening anxiety/PTSD impacting their ability to function appropriately and carry out daily task. Patient denies any current SI/HI. Patient is agreeable to referral to [facility] for counseling and psychiatry. [facility] LCSW made referral on 09/09/21. Email sent to patient with resources and directions on 09/09/21. Patient is agreeable to contact [facility] within one week to schedule initial appointments.  LCSW provided education on relaxation techniques such as meditation, deep breathing, massage, grounding exercises or yoga that can activate the body's relaxation response and ease symptoms of stress and anxiety. LCSW ask that when pt is struggling with difficult emotions and racing thoughts that they start this relaxation response process. LCSW provided extensive education on healthy coping skills for anxiety. SW used active and reflective listening, validated patient's feelings/concerns, and provided emotional support. Patient will work on implementing appropriate self-care habits into their daily routine such as: staying positive, writing a gratitude list, drinking water, staying active around the house, taking their medications as prescribed, combating negative thoughts or emotions and staying connected with their family and friends. Positive reinforcement provided for this decision to work on this. Patient reports wanting assistance with housing and financial stressors. Referral made for [facility] BSW and appointment was scheduled for 09/13/21 at 9:00 am. Patient was made aware that housing resources are very limited within her area.  Motivational Interviewing employed Depression screen reviewed  PHQ2/ PHQ9 completed Mindfulness or Relaxation training provided Active listening / Reflection utilized  Advance Care and HCPOA education provided Emotional Support Provided Problem  [place] strategies reviewed Provided psychoeducation for mental health needs  Provided brief CBT  Reviewed mental health medications and discussed importance of compliance:  Quality of sleep assessed & Sleep Hygiene techniques promoted  Participation in counseling encouraged  Verbalization of feelings encouraged  Suicidal Ideation/Homicidal Ideation assessed: Patient denies SI/HI  Review resources, discussed options and provided patient information about  [place] care team collaboration (see longitudinal plan of care) UPDATE 09/17/21- Patient reports that she has not had a chance to contact [facility] to schedule her appointments and she has not heard from them yet. [facility] LCSW sent an additional email with directions on how to schedule initial appointments at [facility] and coping skill and stress management worksheets to review. [facility] LCSW provided emotional support to patient and advised her to contact the suicide hotline at 988 if she experiences any SI. [facility] LCSW will follow up within two weeks. UPDATE- Patient missed a call from [facility] but plans to call them back this week. She denies any current crises. She reports that she did speak with [facility] BSW regarding her need for community resource connection. [facility] LCSW sent patient another email on 10/01/21 with resources. Emotional support provided.  10/03/21: BSW completed a telephone outreach with patient. She stated she did receive the email with resources. Patient states no other resources/ services are needed at this time.  Patient Goals/Self-Care Activities: Over the next 120 days Attend scheduled medical appointments Utilize healthy coping skills and supportive  resources discussed Contact PCP with any questions or concerns Keep 90 percent of counseling appointments Call your insurance provider for more information about your Enhanced Benefits  Check out counseling resources provided  Begin personal counseling with  LCSW, to reduce and manage symptoms of Depression and Stress, until well-established with mental health provider Accept all calls from representative with [facility] in an effort to establish ongoing mental health counseling and supportive services. Incorporate into daily practice - relaxation techniques, deep breathing exercises, and mindfulness meditation strategies. Talk about feelings with friends, family members, spiritual advisor, etc. Contact LCSW directly [phone]), if you have questions, need assistance, or if additional social work needs are identified between now and our next scheduled telephone outreach call. Call 988 for mental health hotline/crisis line if needed (24/7 available) Try techniques to reduce symptoms of anxiety/negative thinking (deep breathing, distraction, positive self talk, etc)  - develop a personal safety plan - develop a plan to deal with triggers like holidays, anniversaries - exercise at least 2 to 3 times per week - have a plan for how to handle bad days - journal feelings and what helps to feel better or worse - spend time or talk with others at least 2 to 3 times per week - watch for early signs of feeling worse - begin personal counseling - call and visit an old friend - check out volunteer opportunities - join a support group - laugh; watch a funny movie or comedian - learn and use visualization or guided imagery - perform a random act of kindness - practice relaxation or meditation daily - start or continue a personal journal - practice positive thinking and self-talk -continue with compliance of taking medication  -identify current effective and ineffective coping strategies.  -implement positive self-talk in care to increase self-esteem, confidence and feelings of control.  -consider alternative and complementary therapy approaches such as meditation, mindfulness or yoga.  -journaling, prayer, worship services, meditation or pastoral  counseling.  -increase participation in pleasurable group activities such as hobbies, singing, sports or volunteering).  -consider the use of meditative movement therapy such as tai chi, yoga or qigong.  -start a regular daily exercise program based on tolerance, ability and patient choice to support positive thinking and activity    If you are experiencing a Mental Health or [place] or need someone to talk to, please call the Suicide and Crisis Lifeline: 988   The following coping skill education was provided for stress relief and mental health management: "When your car dies or a deadline looms, how do you respond? Long-term, low-grade or acute stress takes a serious toll on your body and mind, so don't ignore feelings of constant tension. Stress is a natural part of life. However, too much stress can harm our health, especially if it continues every day. This is chronic stress and can put you at risk for heart problems like heart disease and depression. Understand what's happening inside your body and learn simple coping skills to combat the negative impacts of everyday stressors.  Types of Stress There are two types of stress: Emotional - types of emotional stress are relationship problems, pressure at work, financial worries, experiencing discrimination or having a major life change. Physical - Examples of physical stress include being sick having pain, not sleeping well, recovery from an injury or having an alcohol and drug use disorder. Fight or Flight Sudden or ongoing stress activates your nervous system and floods your bloodstream with adrenaline and cortisol, two hormones that  raise blood pressure, increase heart rate and spike blood sugar. These changes pitch your body into a fight or flight response. That enabled our ancestors to outrun saber-toothed tigers, and it's helpful today for situations like dodging a car accident. But most modern chronic stressors, such as  finances or a challenging relationship, keep your body in that heightened state, which hurts your health. Effects of Too Much Stress If constantly under stress, most of [place] will eventually start to function less well.  Multiple studies link chronic stress to a higher risk of heart disease, stroke, depression, weight gain, memory loss and even premature death, so it's important to recognize the warning signals. Talk to your doctor about ways to manage stress if you're experiencing any of these symptoms: Prolonged periods of poor sleep. Regular, severe headaches. Unexplained weight loss or gain. Feelings of isolation, withdrawal or worthlessness. Constant anger and irritability. Loss of interest in activities. Constant worrying or obsessive thinking. Excessive alcohol or drug use. Inability to concentrate.  10 Ways to Cope with Chronic Stress It's key to recognize stressful situations as they occur because it allows you to focus on managing how you react. We all need to know when to close our eyes and take a deep breath when we feel tension rising. Use these tips to prevent or reduce chronic stress. 1. Rebalance Work and Home All work and no play? If you're spending too much time at the office, intentionally put more dates in your calendar to enjoy time for fun, either alone or with others. 2. Get Regular Exercise Moving your body on a regular basis balances the nervous system and increases blood circulation, helping to flush out stress hormones. Even a daily 20-minute walk makes a difference. Any kind of exercise can lower stress and improve your mood ? just pick activities that you enjoy and make it a regular habit. 3. Eat Well and Limit Alcohol and Stimulants Alcohol, nicotine and caffeine may temporarily relieve stress but have negative health impacts and can make stress worse in the long run. Well-nourished bodies cope better, so start with a good breakfast, add more organic fruits and  vegetables for a well-balanced diet, avoid processed foods and sugar, try herbal tea and drink more water. 4. Connect with Supportive People Talking face to face with another person releases hormones that reduce stress. Lean on those good listeners in your life. 5. [place] Time Do you enjoy gardening, reading, listening to music or some other creative pursuit? Engage in activities that bring you pleasure and joy; research shows that reduces stress by almost half and lowers your heart rate, too. 6. Practice Meditation, Stress Reduction or Yoga Relaxation techniques activate a state of restfulness that counterbalances your body's fight-or-flight hormones. Even if this also means a 10-minute break in a long day: listen to music, read, go for a walk in nature, do a hobby, take a bath or spend time with a friend. Also consider doing a mindfulness exercise or try a daily deep breathing or imagery practice. Deep Breathing Slow, calm and deep breathing can help you relax. Try these steps to focus on your breathing and repeat as needed. Find a comfortable position and close your eyes. Exhale and drop your shoulders. Breathe in through your nose; fill your lungs and then your belly. Think of relaxing your body, quieting your mind and becoming calm and peaceful. Breathe out slowly through your nose, relaxing your belly. Think of releasing tension, pain, worries or distress. Repeat steps three  and four until you feel relaxed. Imagery This involves using your mind to excite the senses -- sound, vision, smell, taste and feeling. This may help ease your stress. Begin by getting comfortable and then do some slow breathing. Imagine a place you love being at. It could be somewhere from your childhood, somewhere you vacationed or just a place in your imagination. Feel how it is to be in the place you're imagining. Pay attention to the sounds, air, colors, and who is there with you. This is a place where you  feel cared for and loved. All is well. You are safe. Take in all the smells, sounds, tastes and feelings. As you do, feel your body being nourished and healed. Feel the calm that surrounds you. Breathe in all the good. Breathe out any discomfort or tension. 7. Sleep Enough If you get less than seven to eight hours of sleep, your body won't tolerate stress as well as it could. If stress keeps you up at night, address the cause, and add extra meditation into your day to make up for the lost z's. Try to get seven to nine hours of sleep each night. Make a regular bedtime schedule. Keep your room dark and cool. Try to avoid computers, TV, cell phones and tablets before bed. 8. Bond with Connections You Enjoy Go out for a coffee with a friend, chat with a neighbor, call a family member, visit with a clergy member, or even hang out with your pet. Clinical studies show that spending even a short time with a companion animal can cut anxiety levels almost in half. 9. Take a Vacation Getting away from it all can reset your stress tolerance by increasing your mental and emotional outlook, which makes you a happier, more productive person upon return. Leave your cellphone and laptop at home! 10. See a Counselor, Coach or Therapist If negative thoughts overwhelm your ability to make positive changes, it's time to seek professional help. Make an appointment today--your health and life are worth it."     24- Hour Availability:    [facility]  [address] [place], [place] Crisis [phone]   [facility] the [employer] [phone]   [place]  [phone]    [place]  [phone] (after hours)   Therapeutic Alternative/Mobile Crisis   [phone]   [place] National Suicide Hotline  539-297-8118 Diamantina Monks) [place] 988   Call 911 or go to emergency room   [facility]  [phone]);  [place]  and [employer]  [phone]); [place], [place], [place], [place], [place], [place], [place]          Patient Goals: Follow up goal

## 2021-10-15 ENCOUNTER — Other Ambulatory Visit: Payer: Self-pay

## 2021-10-15 NOTE — Patient Instructions (Signed)
Staisha A Raineri ,   The Medicaid Managed Care Team is available to provide assistance to you with your healthcare needs at no cost and as a benefit of your Medicaid Health plan. I'm sorry I was unable to reach you today for our scheduled appointment. Our care guide will call you to reschedule our telephone appointment. Please call me at the number below. I am available to be of assistance to you regarding your healthcare needs. .   Thank you,   Zadyn Yardley, BSW, MSW, LCSW Managed Medicaid LCSW [place]  [facility] [email or web address] Phone: [phone]   

## 2021-10-15 NOTE — Patient Outreach (Signed)
  Medicaid Managed Care   Unsuccessful Attempt Note   10/15/2021 Name: Alicia Pearson MRN: [id number] DOB: 1986-01-13  Referred by: Katha Cabal, DO Reason for referral : High Risk Managed Medicaid   An unsuccessful telephone outreach was attempted today. The patient was referred to the case management team for assistance with care management and care coordination.    Follow Up Plan: A HIPAA compliant phone message was left for the patient providing contact information and requesting a return call.   Dickie La, BSW, MSW, [employer] Managed Medicaid LCSW [facility]  [facility] [email or web address] Phone: [phone]

## 2021-10-17 ENCOUNTER — Other Ambulatory Visit: Payer: Self-pay | Admitting: Licensed Clinical Social Worker

## 2021-10-17 NOTE — Patient Instructions (Signed)
Visit Information  [name] was given information about Medicaid Managed Care team care coordination services as a part of their [place] Medicaid benefit. Alicia Pearson verbally consented to engagement with the [facility] Managed Care team.   If you are experiencing a medical emergency, please call 911 or report to your local emergency department or urgent care.   If you have a non-emergency medical problem during routine business hours, please contact your provider's office and ask to speak with a nurse.   For questions related to your [facility], please call: [phone] or visit the homepage here: [email or web address]  If you would like to schedule transportation through your [facility], please call the following number at least 2 days in advance of your appointment: [phone]   Rides for urgent appointments can also be made after hours by calling Member Services.  Call the [place] at [phone], at any time, 24 hours a day, 7 days a week. If you are in danger or need immediate medical attention call 911.  If you would like help to quit smoking, call 1-800-QUIT-NOW (506) 762-1652) OR Espaol: 1-855-Djelo-Ya (8-616-837-2902) o para ms informacin haga clic aqu or Text READY to 200-400 to register via text   Following is a copy of your plan of care:  Care Plan : LCSW Plan of Care  Updates made by Greg Cutter, LCSW since 10/17/2021 12:00 AM     Problem: Anxiety Identification (Anxiety)      Long-Range Goal: PTSD Symptoms Identified   Start Date: 09/09/2021  Priority: High  Note:   Priority: High  Timeframe:  Long-Range Goal Priority:  High Start Date:   09/09/21               Expected End Date:  ongoing                     Follow Up Date--10/24/21 at 10:30 am.  - keep 90 percent of scheduled  appointments -consider counseling or psychiatry -consider bumping up your self-care  -consider creating a stronger support network   Why is this important?             Beating depression and anxiety may take some time.            If you don't feel better right away, don't give up on your treatment plan.    Current barriers:   Chronic Mental Health needs related to PTSD. Patient requires Support, Education, Resources, Referrals, Advocacy, and Care Coordination, in order to meet Unmet Mental Health Needs. Patient will implement clinical interventions discussed today to decrease symptoms of PTSD and increase knowledge and/or ability of: coping skills. Mental Health Concerns and Social Isolation Patient lacks knowledge of available community counseling agencies and resources.  Clinical Goal(s): verbalize understanding of plan for management of PTSD and demonstrate a reduction in symptoms. Patient will connect with a provider for ongoing mental health treatment, increase coping skills, healthy habits, self-management skills, and stress reduction       Patient Goals/Self-Care Activities: Over the next 120 days Attend scheduled medical appointments Utilize healthy coping skills and supportive resources discussed Contact PCP with any questions or concerns Keep 90 percent of counseling appointments Call your insurance provider for more information about your Enhanced Benefits  Check out counseling resources provided  Begin personal counseling with LCSW, to reduce and manage symptoms of Depression and Stress, until well-established with mental health provider Accept  all calls from representative with [facility] in an effort to establish ongoing mental health counseling and supportive services. Incorporate into daily practice - relaxation techniques, deep breathing exercises, and mindfulness meditation strategies. Talk about feelings with friends, family members, spiritual advisor, etc. Contact LCSW  directly [phone]), if you have questions, need assistance, or if additional social work needs are identified between now and our next scheduled telephone outreach call. Call 988 for mental health hotline/crisis line if needed (24/7 available) Try techniques to reduce symptoms of anxiety/negative thinking (deep breathing, distraction, positive self talk, etc)  - develop a personal safety plan - develop a plan to deal with triggers like holidays, anniversaries - exercise at least 2 to 3 times per week - have a plan for how to handle bad days - journal feelings and what helps to feel better or worse - spend time or talk with others at least 2 to 3 times per week - watch for early signs of feeling worse - begin personal counseling - call and visit an old friend - check out volunteer opportunities - join a support group - laugh; watch a funny movie or comedian - learn and use visualization or guided imagery - perform a random act of kindness - practice relaxation or meditation daily - start or continue a personal journal - practice positive thinking and self-talk -continue with compliance of taking medication  -identify current effective and ineffective coping strategies.  -implement positive self-talk in care to increase self-esteem, confidence and feelings of control.  -consider alternative and complementary therapy approaches such as meditation, mindfulness or yoga.  -journaling, prayer, worship services, meditation or pastoral counseling.  -increase participation in pleasurable group activities such as hobbies, singing, sports or volunteering).  -consider the use of meditative movement therapy such as tai chi, yoga or qigong.  -start a regular daily exercise program based on tolerance, ability and patient choice to support positive thinking and activity    If you are experiencing a Mental Health or [place] or need someone to talk to, please call the Suicide and  Crisis Lifeline: 988   The following coping skill education was provided for stress relief and mental health management: "When your car dies or a deadline looms, how do you respond? Long-term, low-grade or acute stress takes a serious toll on your body and mind, so don't ignore feelings of constant tension. Stress is a natural part of life. However, too much stress can harm our health, especially if it continues every day. This is chronic stress and can put you at risk for heart problems like heart disease and depression. Understand what's happening inside your body and learn simple coping skills to combat the negative impacts of everyday stressors.  Types of Stress There are two types of stress: Emotional - types of emotional stress are relationship problems, pressure at work, financial worries, experiencing discrimination or having a major life change. Physical - Examples of physical stress include being sick having pain, not sleeping well, recovery from an injury or having an alcohol and drug use disorder. Fight or Flight Sudden or ongoing stress activates your nervous system and floods your bloodstream with adrenaline and cortisol, two hormones that raise blood pressure, increase heart rate and spike blood sugar. These changes pitch your body into a fight or flight response. That enabled our ancestors to outrun saber-toothed tigers, and it's helpful today for situations like dodging a car accident. But most modern chronic stressors, such as finances or a challenging relationship, keep your  body in that heightened state, which hurts your health. Effects of Too Much Stress If constantly under stress, most of [place] will eventually start to function less well.  Multiple studies link chronic stress to a higher risk of heart disease, stroke, depression, weight gain, memory loss and even premature death, so it's important to recognize the warning signals. Talk to your doctor about ways to manage stress if  you're experiencing any of these symptoms: Prolonged periods of poor sleep. Regular, severe headaches. Unexplained weight loss or gain. Feelings of isolation, withdrawal or worthlessness. Constant anger and irritability. Loss of interest in activities. Constant worrying or obsessive thinking. Excessive alcohol or drug use. Inability to concentrate.  10 Ways to Cope with Chronic Stress It's key to recognize stressful situations as they occur because it allows you to focus on managing how you react. We all need to know when to close our eyes and take a deep breath when we feel tension rising. Use these tips to prevent or reduce chronic stress. 1. Rebalance Work and Home All work and no play? If you're spending too much time at the office, intentionally put more dates in your calendar to enjoy time for fun, either alone or with others. 2. Get Regular Exercise Moving your body on a regular basis balances the nervous system and increases blood circulation, helping to flush out stress hormones. Even a daily 20-minute walk makes a difference. Any kind of exercise can lower stress and improve your mood ? just pick activities that you enjoy and make it a regular habit. 3. Eat Well and Limit Alcohol and Stimulants Alcohol, nicotine and caffeine may temporarily relieve stress but have negative health impacts and can make stress worse in the long run. Well-nourished bodies cope better, so start with a good breakfast, add more organic fruits and vegetables for a well-balanced diet, avoid processed foods and sugar, try herbal tea and drink more water. 4. Connect with Supportive People Talking face to face with another person releases hormones that reduce stress. Lean on those good listeners in your life. 5. [place] Time Do you enjoy gardening, reading, listening to music or some other creative pursuit? Engage in activities that bring you pleasure and joy; research shows that reduces stress by almost  half and lowers your heart rate, too. 6. Practice Meditation, Stress Reduction or Yoga Relaxation techniques activate a state of restfulness that counterbalances your body's fight-or-flight hormones. Even if this also means a 10-minute break in a long day: listen to music, read, go for a walk in nature, do a hobby, take a bath or spend time with a friend. Also consider doing a mindfulness exercise or try a daily deep breathing or imagery practice. Deep Breathing Slow, calm and deep breathing can help you relax. Try these steps to focus on your breathing and repeat as needed. Find a comfortable position and close your eyes. Exhale and drop your shoulders. Breathe in through your nose; fill your lungs and then your belly. Think of relaxing your body, quieting your mind and becoming calm and peaceful. Breathe out slowly through your nose, relaxing your belly. Think of releasing tension, pain, worries or distress. Repeat steps three and four until you feel relaxed. Imagery This involves using your mind to excite the senses -- sound, vision, smell, taste and feeling. This may help ease your stress. Begin by getting comfortable and then do some slow breathing. Imagine a place you love being at. It could be somewhere from your childhood, somewhere  you vacationed or just a place in your imagination. Feel how it is to be in the place you're imagining. Pay attention to the sounds, air, colors, and who is there with you. This is a place where you feel cared for and loved. All is well. You are safe. Take in all the smells, sounds, tastes and feelings. As you do, feel your body being nourished and healed. Feel the calm that surrounds you. Breathe in all the good. Breathe out any discomfort or tension. 7. Sleep Enough If you get less than seven to eight hours of sleep, your body won't tolerate stress as well as it could. If stress keeps you up at night, address the cause, and add extra meditation into your day  to make up for the lost z's. Try to get seven to nine hours of sleep each night. Make a regular bedtime schedule. Keep your room dark and cool. Try to avoid computers, TV, cell phones and tablets before bed. 8. Bond with Connections You Enjoy Go out for a coffee with a friend, chat with a neighbor, call a family member, visit with a clergy member, or even hang out with your pet. Clinical studies show that spending even a short time with a companion animal can cut anxiety levels almost in half. 9. Take a Vacation Getting away from it all can reset your stress tolerance by increasing your mental and emotional outlook, which makes you a happier, more productive person upon return. Leave your cellphone and laptop at home! 10. See a Counselor, Coach or Therapist If negative thoughts overwhelm your ability to make positive changes, it's time to seek professional help. Make an appointment today--your health and life are worth it."     24- Hour Availability:    [facility]  [address] [place], [place] [facility] [phone]   [facility] [employer] [phone]   [place]  [phone]    [place]  [phone] (after hours)   Therapeutic Alternative/Mobile Crisis   [phone]   [place] National Suicide Hotline  (604)063-9594 Diamantina Monks) [place] 988   Call 911 or go to emergency room   [facility]  [phone]);  [place] and [employer]  [phone]); [place], [place], [place], [place], [place], [place], [place]          Patient Goals: Follow up goal

## 2021-10-17 NOTE — Patient Outreach (Signed)
Medicaid Managed Care Social Work Note   10/01/2021 Name:  Alicia Pearson         MRN:  [id number]      DOB:  [birth year]   Alicia Pearson is an 36 y.o. year old female who is a primary patient of Lyndee Hensen, DO.  The Medicaid Managed Care Coordination team was consulted for assistance with:  [place] and Resources   Ms. Sillas was given information about Medicaid Managed Care Coordination team services today. [name] Patient agreed to services and verbal consent obtained.   Engaged with patient  for by telephone forfollow up visit in response to referral for case management and/or care coordination services.   SDOH Screenings   Food Insecurity: No Food Insecurity (09/13/2021)  Housing: Low Risk  (10/17/2021)  Transportation Needs: No Transportation Needs (09/13/2021)  Depression (PHQ2-9): High Risk (09/17/2021)  Financial Resource Strain: Medium Risk (09/13/2021)  Stress: Stress Concern Present (10/17/2021)  Tobacco Use: High Risk (05/09/2021)     Assessments/Interventions:  Review of past medical history, allergies, medications, health status, including review of consultants reports, laboratory and other test data, was performed as part of comprehensive evaluation and provision of chronic care management services.    Conditions to be addressed/monitored per PCP order:  Anxiety and Depression  Patient Care Plan: LCSW Plan of Care     Problem Identified: Anxiety Identification (Anxiety)      Long-Range Goal: PTSD Symptoms Identified   Start Date: 09/09/2021  Priority: High  Note:   Priority: High  Timeframe:  Long-Range Goal Priority:  High Start Date:   09/09/21               Expected End Date:  ongoing                     Follow Up Date--10/24/21 at 10:30 am.  - keep 90 percent of scheduled appointments -consider counseling or psychiatry -consider bumping up your self-care  -consider creating a stronger support network   Why is  this important?             Beating depression and anxiety may take some time.            If you don't feel better right away, don't give up on your treatment plan.    Current barriers:   Chronic Mental Health needs related to PTSD. Patient requires Support, Education, Resources, Referrals, Advocacy, and Care Coordination, in order to meet Unmet Mental Health Needs. Patient will implement clinical interventions discussed today to decrease symptoms of PTSD and increase knowledge and/or ability of: coping skills. Mental Health Concerns and Social Isolation Patient lacks knowledge of available community counseling agencies and resources.  Clinical Goal(s): verbalize understanding of plan for management of PTSD and demonstrate a reduction in symptoms. Patient will connect with a provider for ongoing mental health treatment, increase coping skills, healthy habits, self-management skills, and stress reduction        Clinical Interventions:  Assessed patient's previous and current treatment, coping skills, support system and barriers to care. Patient provided hx  Verbalization of feelings encouraged, motivational interviewing employed Emotional support provided, positive coping strategies explored Self care/establishing healthy boundaries emphasized Patient was educated on available mental health resources within their area that accept Medicaid and offer counseling and psychiatry. Patient reports that she has PTSD and is in need of creating a mental health support. She wishes to gain both psychiatry and counseling. She reports needs education on  skills to implement into her daily routine to reduce her PTSD symptoms.  Patient reports significant worsening anxiety/PTSD impacting their ability to function appropriately and carry out daily task. Patient denies any current SI/HI. Patient is agreeable to referral to [facility] for counseling and psychiatry. [facility] LCSW made referral on 09/09/21. Email sent to patient  with resources and directions on 09/09/21. Patient is agreeable to contact [facility] within one week to schedule initial appointments.  LCSW provided education on relaxation techniques such as meditation, deep breathing, massage, grounding exercises or yoga that can activate the body's relaxation response and ease symptoms of stress and anxiety. LCSW ask that when pt is struggling with difficult emotions and racing thoughts that they start this relaxation response process. LCSW provided extensive education on healthy coping skills for anxiety. SW used active and reflective listening, validated patient's feelings/concerns, and provided emotional support. Patient will work on implementing appropriate self-care habits into their daily routine such as: staying positive, writing a gratitude list, drinking water, staying active around the house, taking their medications as prescribed, combating negative thoughts or emotions and staying connected with their family and friends. Positive reinforcement provided for this decision to work on this. Patient reports wanting assistance with housing and financial stressors. Referral made for [facility] BSW and appointment was scheduled for 09/13/21 at 9:00 am. Patient was made aware that housing resources are very limited within her area.  Motivational Interviewing employed Depression screen reviewed  PHQ2/ PHQ9 completed Mindfulness or Relaxation training provided Active listening / Reflection utilized  Advance Care and HCPOA education provided Emotional Support Provided Problem [place] strategies reviewed Provided psychoeducation for mental health needs  Provided brief CBT  Reviewed mental health medications and discussed importance of compliance:  Quality of sleep assessed & Sleep Hygiene techniques promoted  Participation in counseling encouraged  Verbalization of feelings encouraged  Suicidal Ideation/Homicidal Ideation assessed: Patient denies SI/HI  Review  resources, discussed options and provided patient information about  [place] care team collaboration (see longitudinal plan of care) UPDATE 09/17/21- Patient reports that she has not had a chance to contact [facility] to schedule her appointments and she has not heard from them yet. [facility] LCSW sent an additional email with directions on how to schedule initial appointments at [facility] and coping skill and stress management worksheets to review. [facility] LCSW provided emotional support to patient and advised her to contact the suicide hotline at 988 if she experiences any SI. [facility] [place] LCSW will follow up within two weeks. UPDATE- Patient missed a call from [place] but plans to call them back this week. She denies any current crises. She reports that she did speak with [facility] BSW regarding her need for community resource connection. [facility] LCSW sent patient another email on 10/01/21 with resources. Emotional support provided. UPDATE- Patient has been unable to schedule her initial appointments with [facility]. [facility] LCSW and patient completed joint phone call to [facility] and left a message. Email sent to practice as well.  10/03/21: BSW completed a telephone outreach with patient. She stated she did receive the email with resources. Patient states no other resources/ services are needed at this time.  Patient Goals/Self-Care Activities: Over the next 120 days Attend scheduled medical appointments Utilize healthy coping skills and supportive resources discussed Contact PCP with any questions or concerns Keep 90 percent of counseling appointments Call your insurance provider for more information about your Enhanced Benefits  Check out counseling resources provided  Begin personal counseling with LCSW, to reduce and  manage symptoms of Depression and Stress, until well-established with mental health provider Accept all calls from representative with [facility] in an effort to establish ongoing mental health counseling  and supportive services. Incorporate into daily practice - relaxation techniques, deep breathing exercises, and mindfulness meditation strategies. Talk about feelings with friends, family members, spiritual advisor, etc. Contact LCSW directly [phone]), if you have questions, need assistance, or if additional social work needs are identified between now and our next scheduled telephone outreach call. Call 988 for mental health hotline/crisis line if needed (24/7 available) Try techniques to reduce symptoms of anxiety/negative thinking (deep breathing, distraction, positive self talk, etc)  - develop a personal safety plan - develop a plan to deal with triggers like holidays, anniversaries - exercise at least 2 to 3 times per week - have a plan for how to handle bad days - journal feelings and what helps to feel better or worse - spend time or talk with others at least 2 to 3 times per week - watch for early signs of feeling worse - begin personal counseling - call and visit an old friend - check out volunteer opportunities - join a support group - laugh; watch a funny movie or comedian - learn and use visualization or guided imagery - perform a random act of kindness - practice relaxation or meditation daily - start or continue a personal journal - practice positive thinking and self-talk -continue with compliance of taking medication  -identify current effective and ineffective coping strategies.  -implement positive self-talk in care to increase self-esteem, confidence and feelings of control.  -consider alternative and complementary therapy approaches such as meditation, mindfulness or yoga.  -journaling, prayer, worship services, meditation or pastoral counseling.  -increase participation in pleasurable group activities such as hobbies, singing, sports or volunteering).  -consider the use of meditative movement therapy such as tai chi, yoga or qigong.  -start a regular daily  exercise program based on tolerance, ability and patient choice to support positive thinking and activity    If you are experiencing a Mental Health or [place] or need someone to talk to, please call the Suicide and Crisis Lifeline: 988   The following coping skill education was provided for stress relief and mental health management: "When your car dies or a deadline looms, how do you respond? Long-term, low-grade or acute stress takes a serious toll on your body and mind, so don't ignore feelings of constant tension. Stress is a natural part of life. However, too much stress can harm our health, especially if it continues every day. This is chronic stress and can put you at risk for heart problems like heart disease and depression. Understand what's happening inside your body and learn simple coping skills to combat the negative impacts of everyday stressors.  Types of Stress There are two types of stress: Emotional - types of emotional stress are relationship problems, pressure at work, financial worries, experiencing discrimination or having a major life change. Physical - Examples of physical stress include being sick having pain, not sleeping well, recovery from an injury or having an alcohol and drug use disorder. Fight or Flight Sudden or ongoing stress activates your nervous system and floods your bloodstream with adrenaline and cortisol, two hormones that raise blood pressure, increase heart rate and spike blood sugar. These changes pitch your body into a fight or flight response. That enabled our ancestors to outrun saber-toothed tigers, and it's helpful today for situations like dodging a car accident. But  most modern chronic stressors, such as finances or a challenging relationship, keep your body in that heightened state, which hurts your health. Effects of Too Much Stress If constantly under stress, most of [place] will eventually start to function less well.  Multiple  studies link chronic stress to a higher risk of heart disease, stroke, depression, weight gain, memory loss and even premature death, so it's important to recognize the warning signals. Talk to your doctor about ways to manage stress if you're experiencing any of these symptoms: Prolonged periods of poor sleep. Regular, severe headaches. Unexplained weight loss or gain. Feelings of isolation, withdrawal or worthlessness. Constant anger and irritability. Loss of interest in activities. Constant worrying or obsessive thinking. Excessive alcohol or drug use. Inability to concentrate.  10 Ways to Cope with Chronic Stress It's key to recognize stressful situations as they occur because it allows you to focus on managing how you react. We all need to know when to close our eyes and take a deep breath when we feel tension rising. Use these tips to prevent or reduce chronic stress. 1. Rebalance Work and Home All work and no play? If you're spending too much time at the office, intentionally put more dates in your calendar to enjoy time for fun, either alone or with others. 2. Get Regular Exercise Moving your body on a regular basis balances the nervous system and increases blood circulation, helping to flush out stress hormones. Even a daily 20-minute walk makes a difference. Any kind of exercise can lower stress and improve your mood ? just pick activities that you enjoy and make it a regular habit. 3. Eat Well and Limit Alcohol and Stimulants Alcohol, nicotine and caffeine may temporarily relieve stress but have negative health impacts and can make stress worse in the long run. Well-nourished bodies cope better, so start with a good breakfast, add more organic fruits and vegetables for a well-balanced diet, avoid processed foods and sugar, try herbal tea and drink more water. 4. Connect with Supportive People Talking face to face with another person releases hormones that reduce stress. Lean on those  good listeners in your life. 5. [place] Time Do you enjoy gardening, reading, listening to music or some other creative pursuit? Engage in activities that bring you pleasure and joy; research shows that reduces stress by almost half and lowers your heart rate, too. 6. Practice Meditation, Stress Reduction or Yoga Relaxation techniques activate a state of restfulness that counterbalances your body's fight-or-flight hormones. Even if this also means a 10-minute break in a long day: listen to music, read, go for a walk in nature, do a hobby, take a bath or spend time with a friend. Also consider doing a mindfulness exercise or try a daily deep breathing or imagery practice. Deep Breathing Slow, calm and deep breathing can help you relax. Try these steps to focus on your breathing and repeat as needed. Find a comfortable position and close your eyes. Exhale and drop your shoulders. Breathe in through your nose; fill your lungs and then your belly. Think of relaxing your body, quieting your mind and becoming calm and peaceful. Breathe out slowly through your nose, relaxing your belly. Think of releasing tension, pain, worries or distress. Repeat steps three and four until you feel relaxed. Imagery This involves using your mind to excite the senses -- sound, vision, smell, taste and feeling. This may help ease your stress. Begin by getting comfortable and then do some slow breathing. Imagine a  place you love being at. It could be somewhere from your childhood, somewhere you vacationed or just a place in your imagination. Feel how it is to be in the place you're imagining. Pay attention to the sounds, air, colors, and who is there with you. This is a place where you feel cared for and loved. All is well. You are safe. Take in all the smells, sounds, tastes and feelings. As you do, feel your body being nourished and healed. Feel the calm that surrounds you. Breathe in all the good. Breathe out any  discomfort or tension. 7. Sleep Enough If you get less than seven to eight hours of sleep, your body won't tolerate stress as well as it could. If stress keeps you up at night, address the cause, and add extra meditation into your day to make up for the lost z's. Try to get seven to nine hours of sleep each night. Make a regular bedtime schedule. Keep your room dark and cool. Try to avoid computers, TV, cell phones and tablets before bed. 8. Bond with Connections You Enjoy Go out for a coffee with a friend, chat with a neighbor, call a family member, visit with a clergy member, or even hang out with your pet. Clinical studies show that spending even a short time with a companion animal can cut anxiety levels almost in half. 9. Take a Vacation Getting away from it all can reset your stress tolerance by increasing your mental and emotional outlook, which makes you a happier, more productive person upon return. Leave your cellphone and laptop at home! 10. See a Counselor, Coach or Therapist If negative thoughts overwhelm your ability to make positive changes, it's time to seek professional help. Make an appointment today--your health and life are worth it."     24- Hour Availability:    [facility]  [address] [place], [place] Crisis [phone]   [facility] [employer] [phone]   [place]  [phone]    [place]  [phone] (after hours)   Therapeutic Alternative/Mobile Crisis   [phone]   [place] National Suicide Hotline  (513)719-6532 [place]amantina Monks) [place] 988   Call 911 or go to emergency room   [facility]  [phone]);  [place] and [employer]  [phone]); [place], [place], [place], [place], [place], [place], [place]          Patient Goals: Follow up goal    Eula Fried, BSW, MSW, [employer] Managed Medicaid LCSW [email or web address] Phone: [phone]

## 2021-10-24 ENCOUNTER — Other Ambulatory Visit: Payer: Self-pay | Admitting: Licensed Clinical Social Worker

## 2021-10-24 NOTE — Patient Instructions (Signed)
Visit Information  Alicia Pearson was given information about Medicaid Managed Care team care coordination services as a part of their [place] Medicaid benefit. [name] verbally consented to engagement with the [facility] Managed Care team.   If you are experiencing a medical emergency, please call 911 or report to your local emergency department or urgent care.   If you have a non-emergency medical problem during routine business hours, please contact your provider's office and ask to speak with a nurse.   For questions related to your [facility], please call: [phone] or visit the homepage here: [email or web address]  If you would like to schedule transportation through your [facility], please call the following number at least 2 days in advance of your appointment: [phone]   Rides for urgent appointments can also be made after hours by calling Member Services.  Call the [place] at [phone], at any time, 24 hours a day, 7 days a week. If you are in danger or need immediate medical attention call 911.  If you would like help to quit smoking, call 1-800-QUIT-NOW 7026354719) OR Espaol: 1-855-Djelo-Ya (0-315-945-8592) o para ms informacin haga clic aqu or Text READY to 200-400 to register via text  Following is a copy of your plan of care:  Care Plan : LCSW Plan of Care  Updates made by Greg Cutter, LCSW since 10/24/2021 12:00 AM     Problem: Anxiety Identification (Anxiety)      Long-Range Goal: PTSD Symptoms Identified   Start Date: 09/09/2021  Priority: High  Note:   Priority: High  Timeframe:  Long-Range Goal Priority:  High Start Date:   09/09/21               Expected End Date:  ongoing                     Follow Up Date--10/31/21 at 2:30 pm.  - keep 90 percent of scheduled  appointments -consider counseling or psychiatry -consider bumping up your self-care  -consider creating a stronger support network   Why is this important?             Beating depression and anxiety may take some time.            If you don't feel better right away, don't give up on your treatment plan.    Current barriers:   Chronic Mental Health needs related to PTSD. Patient requires Support, Education, Resources, Referrals, Advocacy, and Care Coordination, in order to meet Unmet Mental Health Needs. Patient will implement clinical interventions discussed today to decrease symptoms of PTSD and increase knowledge and/or ability of: coping skills. Mental Health Concerns and Social Isolation Patient lacks knowledge of available community counseling agencies and resources.  Clinical Goal(s): verbalize understanding of plan for management of PTSD and demonstrate a reduction in symptoms. Patient will connect with a provider for ongoing mental health treatment, increase coping skills, healthy habits, self-management skills, and stress reduction       Patient Goals/Self-Care Activities: Over the next 120 days Attend scheduled medical appointments Utilize healthy coping skills and supportive resources discussed Contact PCP with any questions or concerns Keep 90 percent of counseling appointments Call your insurance provider for more information about your Enhanced Benefits  Check out counseling resources provided  Begin personal counseling with LCSW, to reduce and manage symptoms of Depression and Stress, until well-established with mental health provider Accept all  calls from representative with [facility] in an effort to establish ongoing mental health counseling and supportive services. Incorporate into daily practice - relaxation techniques, deep breathing exercises, and mindfulness meditation strategies. Talk about feelings with friends, family members, spiritual advisor, etc. Contact LCSW  directly [phone]), if you have questions, need assistance, or if additional social work needs are identified between now and our next scheduled telephone outreach call. Call 988 for mental health hotline/crisis line if needed (24/7 available) Try techniques to reduce symptoms of anxiety/negative thinking (deep breathing, distraction, positive self talk, etc)  - develop a personal safety plan - develop a plan to deal with triggers like holidays, anniversaries - exercise at least 2 to 3 times per week - have a plan for how to handle bad days - journal feelings and what helps to feel better or worse - spend time or talk with others at least 2 to 3 times per week - watch for early signs of feeling worse - begin personal counseling - call and visit an old friend - check out volunteer opportunities - join a support group - laugh; watch a funny movie or comedian - learn and use visualization or guided imagery - perform a random act of kindness - practice relaxation or meditation daily - start or continue a personal journal - practice positive thinking and self-talk -continue with compliance of taking medication  -identify current effective and ineffective coping strategies.  -implement positive self-talk in care to increase self-esteem, confidence and feelings of control.  -consider alternative and complementary therapy approaches such as meditation, mindfulness or yoga.  -journaling, prayer, worship services, meditation or pastoral counseling.  -increase participation in pleasurable group activities such as hobbies, singing, sports or volunteering).  -consider the use of meditative movement therapy such as tai chi, yoga or qigong.  -start a regular daily exercise program based on tolerance, ability and patient choice to support positive thinking and activity    If you are experiencing a Mental Health or [place] or need someone to talk to, please call the Suicide and  Crisis Lifeline: 988   The following coping skill education was provided for stress relief and mental health management: "When your car dies or a deadline looms, how do you respond? Long-term, low-grade or acute stress takes a serious toll on your body and mind, so don't ignore feelings of constant tension. Stress is a natural part of life. However, too much stress can harm our health, especially if it continues every day. This is chronic stress and can put you at risk for heart problems like heart disease and depression. Understand what's happening inside your body and learn simple coping skills to combat the negative impacts of everyday stressors.  Types of Stress There are two types of stress: Emotional - types of emotional stress are relationship problems, pressure at work, financial worries, experiencing discrimination or having a major life change. Physical - Examples of physical stress include being sick having pain, not sleeping well, recovery from an injury or having an alcohol and drug use disorder. Fight or Flight Sudden or ongoing stress activates your nervous system and floods your bloodstream with adrenaline and cortisol, two hormones that raise blood pressure, increase heart rate and spike blood sugar. These changes pitch your body into a fight or flight response. That enabled our ancestors to outrun saber-toothed tigers, and it's helpful today for situations like dodging a car accident. But most modern chronic stressors, such as finances or a challenging relationship, keep your body  in that heightened state, which hurts your health. Effects of Too Much Stress If constantly under stress, most of [place] will eventually start to function less well.  Multiple studies link chronic stress to a higher risk of heart disease, stroke, depression, weight gain, memory loss and even premature death, so it's important to recognize the warning signals. Talk to your doctor about ways to manage stress if  you're experiencing any of these symptoms: Prolonged periods of poor sleep. Regular, severe headaches. Unexplained weight loss or gain. Feelings of isolation, withdrawal or worthlessness. Constant anger and irritability. Loss of interest in activities. Constant worrying or obsessive thinking. Excessive alcohol or drug use. Inability to concentrate.  10 Ways to Cope with Chronic Stress It's key to recognize stressful situations as they occur because it allows you to focus on managing how you react. We all need to know when to close our eyes and take a deep breath when we feel tension rising. Use these tips to prevent or reduce chronic stress. 1. Rebalance Work and Home All work and no play? If you're spending too much time at the office, intentionally put more dates in your calendar to enjoy time for fun, either alone or with others. 2. Get Regular Exercise Moving your body on a regular basis balances the nervous system and increases blood circulation, helping to flush out stress hormones. Even a daily 20-minute walk makes a difference. Any kind of exercise can lower stress and improve your mood ? just pick activities that you enjoy and make it a regular habit. 3. Eat Well and Limit Alcohol and Stimulants Alcohol, nicotine and caffeine may temporarily relieve stress but have negative health impacts and can make stress worse in the long run. Well-nourished bodies cope better, so start with a good breakfast, add more organic fruits and vegetables for a well-balanced diet, avoid processed foods and sugar, try herbal tea and drink more water. 4. Connect with Supportive People Talking face to face with another person releases hormones that reduce stress. Lean on those good listeners in your life. 5. [place] Time Do you enjoy gardening, reading, listening to music or some other creative pursuit? Engage in activities that bring you pleasure and joy; research shows that reduces stress by almost  half and lowers your heart rate, too. 6. Practice Meditation, Stress Reduction or Yoga Relaxation techniques activate a state of restfulness that counterbalances your body's fight-or-flight hormones. Even if this also means a 10-minute break in a long day: listen to music, read, go for a walk in nature, do a hobby, take a bath or spend time with a friend. Also consider doing a mindfulness exercise or try a daily deep breathing or imagery practice. Deep Breathing Slow, calm and deep breathing can help you relax. Try these steps to focus on your breathing and repeat as needed. Find a comfortable position and close your eyes. Exhale and drop your shoulders. Breathe in through your nose; fill your lungs and then your belly. Think of relaxing your body, quieting your mind and becoming calm and peaceful. Breathe out slowly through your nose, relaxing your belly. Think of releasing tension, pain, worries or distress. Repeat steps three and four until you feel relaxed. Imagery This involves using your mind to excite the senses -- sound, vision, smell, taste and feeling. This may help ease your stress. Begin by getting comfortable and then do some slow breathing. Imagine a place you love being at. It could be somewhere from your childhood, somewhere you  vacationed or just a place in [employer]. Feel how it is to be in the place you're imagining. Pay attention to the sounds, air, colors, and who is there with you. This is a place where you feel cared for and loved. All is well. You are safe. Take in all the smells, sounds, tastes and feelings. As you do, feel your body being nourished and healed. Feel the calm that surrounds you. Breathe in all the good. Breathe out any discomfort or tension. 7. Sleep Enough If you get less than seven to eight hours of sleep, your body won't tolerate stress as well as it could. If stress keeps you up at night, address the cause, and add extra meditation into your day  to make up for the lost z's. Try to get seven to nine hours of sleep each night. Make a regular bedtime schedule. Keep your room dark and cool. Try to avoid computers, TV, cell phones and tablets before bed. 8. Bond with Connections You Enjoy Go out for a coffee with a friend, chat with a neighbor, call a family member, visit with a clergy member, or even hang out with your pet. Clinical studies show that spending even a short time with a companion animal can cut anxiety levels almost in half. 9. Take a Vacation Getting away from it all can reset your stress tolerance by increasing your mental and emotional outlook, which makes you a happier, more productive person upon return. Leave your cellphone and laptop at home! 10. See a Counselor, Coach or Therapist If negative thoughts overwhelm your ability to make positive changes, it's time to seek professional help. Make an appointment today--your health and life are worth it."     24- Hour Availability:    [facility]  [address] [place], [place] [facility] [phone]   [facility] [employer] [phone]   [place]  [phone]    [place]  [phone] (after hours)   Therapeutic Alternative/Mobile Crisis   [phone]   [place] National Suicide Hotline  (469) 363-0969 Diamantina Monks) [place] 988   Call 911 or go to emergency room   [facility]  [phone]);  [place] and [employer]  [phone]); [place], [place], [place], [place], [place], [place], [place]          Patient Goals: Follow up goal
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
## 2021-10-31 ENCOUNTER — Other Ambulatory Visit: Payer: Self-pay | Admitting: Licensed Clinical Social Worker

## 2021-10-31 NOTE — Patient Instructions (Signed)
Visit Information  [name] was given information about Medicaid Managed Care team care coordination services as a part of their [place] Medicaid benefit. Alicia Pearson verbally consented to engagement with the [facility] Managed Care team.   If you are experiencing a medical emergency, please call 911 or report to your local emergency department or urgent care.   If you have a non-emergency medical problem during routine business hours, please contact your provider's office and ask to speak with a nurse.   For questions related to your [facility], please call: [phone] or visit the homepage here: [email or web address]  If you would like to schedule transportation through your [facility], please call the following number at least 2 days in advance of your appointment: [phone]   Rides for urgent appointments can also be made after hours by calling Member Services.  Call the [place] at [phone], at any time, 24 hours a day, 7 days a week. If you are in danger or need immediate medical attention call 911.  If you would like help to quit smoking, call 1-800-QUIT-NOW 727-603-4870) OR Espaol: 1-855-Djelo-Ya (3-976-734-1937) o para ms informacin haga clic aqu or Text READY to 200-400 to register via text  Following is a copy of your plan of care:  Care Plan : LCSW Plan of Care  Updates made by Greg Cutter, LCSW since 10/31/2021 12:00 AM     Problem: Anxiety Identification (Anxiety)      Long-Range Goal: PTSD Symptoms Identified   Start Date: 09/09/2021  Priority: High  Note:   Priority: High  Timeframe:  Long-Range Goal Priority:  High Start Date:   09/09/21               Expected End Date:  ongoing                     Follow Up Date--11/18/21 at 2:30 pm.  - keep 90 percent of scheduled  appointments -consider counseling or psychiatry -consider bumping up your self-care  -consider creating a stronger support network   Why is this important?             Beating depression and anxiety may take some time.            If you don't feel better right away, don't give up on your treatment plan.    Current barriers:   Chronic Mental Health needs related to PTSD. Patient requires Support, Education, Resources, Referrals, Advocacy, and Care Coordination, in order to meet Unmet Mental Health Needs. Patient will implement clinical interventions discussed today to decrease symptoms of PTSD and increase knowledge and/or ability of: coping skills. Mental Health Concerns and Social Isolation Patient lacks knowledge of available community counseling agencies and resources.  Clinical Goal(s): verbalize understanding of plan for management of PTSD and demonstrate a reduction in symptoms. Patient will connect with a provider for ongoing mental health treatment, increase coping skills, healthy habits, self-management skills, and stress reduction        Patient Goals/Self-Care Activities: Over the next 120 days Attend scheduled medical appointments Utilize healthy coping skills and supportive resources discussed Contact PCP with any questions or concerns Keep 90 percent of counseling appointments Call your insurance provider for more information about your Enhanced Benefits  Check out counseling resources provided  Begin personal counseling with LCSW, to reduce and manage symptoms of Depression and Stress, until well-established with mental health provider Accept  all calls from representative with [facility] in an effort to establish ongoing mental health counseling and supportive services. Incorporate into daily practice - relaxation techniques, deep breathing exercises, and mindfulness meditation strategies. Talk about feelings with friends, family members, spiritual advisor, etc. Contact LCSW  directly [phone]), if you have questions, need assistance, or if additional social work needs are identified between now and our next scheduled telephone outreach call. Call 988 for mental health hotline/crisis line if needed (24/7 available) Try techniques to reduce symptoms of anxiety/negative thinking (deep breathing, distraction, positive self talk, etc)  - develop a personal safety plan - develop a plan to deal with triggers like holidays, anniversaries - exercise at least 2 to 3 times per week - have a plan for how to handle bad days - journal feelings and what helps to feel better or worse - spend time or talk with others at least 2 to 3 times per week - watch for early signs of feeling worse - begin personal counseling - call and visit an old friend - check out volunteer opportunities - join a support group - laugh; watch a funny movie or comedian - learn and use visualization or guided imagery - perform a random act of kindness - practice relaxation or meditation daily - start or continue a personal journal - practice positive thinking and self-talk -continue with compliance of taking medication  -identify current effective and ineffective coping strategies.  -implement positive self-talk in care to increase self-esteem, confidence and feelings of control.  -consider alternative and complementary therapy approaches such as meditation, mindfulness or yoga.  -journaling, prayer, worship services, meditation or pastoral counseling.  -increase participation in pleasurable group activities such as hobbies, singing, sports or volunteering).  -consider the use of meditative movement therapy such as tai chi, yoga or qigong.  -start a regular daily exercise program based on tolerance, ability and patient choice to support positive thinking and activity    If you are experiencing a Mental Health or [place] or need someone to talk to, please call the Suicide and  Crisis Lifeline: 988   The following coping skill education was provided for stress relief and mental health management: "When your car dies or a deadline looms, how do you respond? Long-term, low-grade or acute stress takes a serious toll on your body and mind, so don't ignore feelings of constant tension. Stress is a natural part of life. However, too much stress can harm our health, especially if it continues every day. This is chronic stress and can put you at risk for heart problems like heart disease and depression. Understand what's happening inside your body and learn simple coping skills to combat the negative impacts of everyday stressors.  Types of Stress There are two types of stress: Emotional - types of emotional stress are relationship problems, pressure at work, financial worries, experiencing discrimination or having a major life change. Physical - Examples of physical stress include being sick having pain, not sleeping well, recovery from an injury or having an alcohol and drug use disorder. Fight or Flight Sudden or ongoing stress activates your nervous system and floods your bloodstream with adrenaline and cortisol, two hormones that raise blood pressure, increase heart rate and spike blood sugar. These changes pitch your body into a fight or flight response. That enabled our ancestors to outrun saber-toothed tigers, and it's helpful today for situations like dodging a car accident. But most modern chronic stressors, such as finances or a challenging relationship, keep your  body in that heightened state, which hurts your health. Effects of Too Much Stress If constantly under stress, most of [place] will eventually start to function less well.  Multiple studies link chronic stress to a higher risk of heart disease, stroke, depression, weight gain, memory loss and even premature death, so it's important to recognize the warning signals. Talk to your doctor about ways to manage stress if  you're experiencing any of these symptoms: Prolonged periods of poor sleep. Regular, severe headaches. Unexplained weight loss or gain. Feelings of isolation, withdrawal or worthlessness. Constant anger and irritability. Loss of interest in activities. Constant worrying or obsessive thinking. Excessive alcohol or drug use. Inability to concentrate.  10 Ways to Cope with Chronic Stress It's key to recognize stressful situations as they occur because it allows you to focus on managing how you react. We all need to know when to close our eyes and take a deep breath when we feel tension rising. Use these tips to prevent or reduce chronic stress. 1. Rebalance Work and Home All work and no play? If you're spending too much time at the office, intentionally put more dates in your calendar to enjoy time for fun, either alone or with others. 2. Get Regular Exercise Moving your body on a regular basis balances the nervous system and increases blood circulation, helping to flush out stress hormones. Even a daily 20-minute walk makes a difference. Any kind of exercise can lower stress and improve your mood ? just pick activities that you enjoy and make it a regular habit. 3. Eat Well and Limit Alcohol and Stimulants Alcohol, nicotine and caffeine may temporarily relieve stress but have negative health impacts and can make stress worse in the long run. Well-nourished bodies cope better, so start with a good breakfast, add more organic fruits and vegetables for a well-balanced diet, avoid processed foods and sugar, try herbal tea and drink more water. 4. Connect with Supportive People Talking face to face with another person releases hormones that reduce stress. Lean on those good listeners in your life. 5. [place] Time Do you enjoy gardening, reading, listening to music or some other creative pursuit? Engage in activities that bring you pleasure and joy; research shows that reduces stress by almost  half and lowers your heart rate, too. 6. Practice Meditation, Stress Reduction or Yoga Relaxation techniques activate a state of restfulness that counterbalances your body's fight-or-flight hormones. Even if this also means a 10-minute break in a long day: listen to music, read, go for a walk in nature, do a hobby, take a bath or spend time with a friend. Also consider doing a mindfulness exercise or try a daily deep breathing or imagery practice. Deep Breathing Slow, calm and deep breathing can help you relax. Try these steps to focus on your breathing and repeat as needed. Find a comfortable position and close your eyes. Exhale and drop your shoulders. Breathe in through your nose; fill your lungs and then your belly. Think of relaxing your body, quieting your mind and becoming calm and peaceful. Breathe out slowly through your nose, relaxing your belly. Think of releasing tension, pain, worries or distress. Repeat steps three and four until you feel relaxed. Imagery This involves using your mind to excite the senses -- sound, vision, smell, taste and feeling. This may help ease your stress. Begin by getting comfortable and then do some slow breathing. Imagine a place you love being at. It could be somewhere from your childhood, somewhere  you vacationed or just a place in your imagination. Feel how it is to be in the place you're imagining. Pay attention to the sounds, air, colors, and who is there with you. This is a place where you feel cared for and loved. All is well. You are safe. Take in all the smells, sounds, tastes and feelings. As you do, feel your body being nourished and healed. Feel the calm that surrounds you. Breathe in all the good. Breathe out any discomfort or tension. 7. Sleep Enough If you get less than seven to eight hours of sleep, your body won't tolerate stress as well as it could. If stress keeps you up at night, address the cause, and add extra meditation into your day  to make up for the lost z's. Try to get seven to nine hours of sleep each night. Make a regular bedtime schedule. Keep your room dark and cool. Try to avoid computers, TV, cell phones and tablets before bed. 8. Bond with Connections You Enjoy Go out for a coffee with a friend, chat with a neighbor, call a family member, visit with a clergy member, or even hang out with your pet. Clinical studies show that spending even a short time with a companion animal can cut anxiety levels almost in half. 9. Take a Vacation Getting away from it all can reset your stress tolerance by increasing your mental and emotional outlook, which makes you a happier, more productive person upon return. Leave your cellphone and laptop at home! 10. See a Counselor, Coach or Therapist If negative thoughts overwhelm your ability to make positive changes, it's time to seek professional help. Make an appointment today--your health and life are worth it."     24- Hour Availability:    [facility]  [address] [place], [place] [facility] [phone]   [facility] [employer] [phone]   [place]  [phone]    [place]  [phone] (after hours)   Therapeutic Alternative/Mobile Crisis   [phone]   [place] National Suicide Hotline  (604)063-9594 Diamantina Monks) [place] 988   Call 911 or go to emergency room   [facility]  [phone]);  [place] and [employer]  [phone]); [place], [place], [place], [place], [place], [place], [place]          Patient Goals: Follow up goal
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
## 2021-11-01 ENCOUNTER — Ambulatory Visit (HOSPITAL_COMMUNITY): Payer: Medicaid Other | Admitting: Mental Health

## 2021-11-01 NOTE — Progress Notes (Signed)
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 Rescheduled for later date 11/15/2021 @ 10am

## 2021-11-15 ENCOUNTER — Ambulatory Visit (HOSPITAL_COMMUNITY): Payer: Medicaid Other | Admitting: Mental Health

## 2021-11-15 ENCOUNTER — Encounter (HOSPITAL_COMMUNITY): Payer: Self-pay

## 2021-11-15 ENCOUNTER — Ambulatory Visit (INDEPENDENT_AMBULATORY_CARE_PROVIDER_SITE_OTHER): Payer: Medicaid Other | Admitting: Mental Health

## 2021-11-15 DIAGNOSIS — F322 Major depressive disorder, single episode, severe without psychotic features: Secondary | ICD-10-CM | POA: Diagnosis not present
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
## 2021-11-15 NOTE — Plan of Care (Signed)
  Problem: Depression CCP Problem  1 MDD severe Goal: LTG: Melita WILL SCORE LESS THAN 10 ON THE PATIENT HEALTH QUESTIONNAIRE (PHQ-9) Outcome: Initial Goal: STG: Jorja will decrease depressive sxs AEB development of x 3 effective coping skills with ability to reframe maladaptive thinking patterns 7/7 days within the next 6 months  Outcome: Initial Goal: STG: Mehgan will increase management of stressors AEB engagement in sef-care activities daily within the next 6 months  Outcome: Initial   

## 2021-11-18 ENCOUNTER — Other Ambulatory Visit: Payer: Self-pay | Admitting: Licensed Clinical Social Worker
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
## 2021-11-18 NOTE — Patient Instructions (Signed)
Visit Information  Alicia Pearson was given information about Medicaid Managed Care team care coordination services as a part of their [place] Medicaid benefit. [name] verbally consented to engagement with the [facility] Managed Care team.   If you are experiencing a medical emergency, please call 911 or report to your local emergency department or urgent care.   If you have a non-emergency medical problem during routine business hours, please contact your provider's office and ask to speak with a nurse.   For questions related to your [facility], please call: [phone] or visit the homepage here: [email or web address]  If you would like to schedule transportation through your [facility], please call the following number at least 2 days in advance of your appointment: [phone]   Rides for urgent appointments can also be made after hours by calling Member Services.  Call the [place] at [phone], at any time, 24 hours a day, 7 days a week. If you are in danger or need immediate medical attention call 911.  If you would like help to quit smoking, call 1-800-QUIT-NOW 336-171-8356) OR Espaol: 1-855-Djelo-Ya (2-836-629-4765) o para ms informacin haga clic aqu or Text READY to 200-400 to register via text  Following is a copy of your plan of care:  Care Plan : LCSW Plan of Care  Updates made by Greg Cutter, LCSW since 11/18/2021 12:00 AM     Problem: Anxiety Identification (Anxiety)      Long-Range Goal: PTSD Symptoms Identified   Start Date: 09/09/2021  Priority: High  Note:   Priority: High  Timeframe:  Long-Range Goal Priority:  High Start Date:   09/09/21               Expected End Date:  ongoing                     Follow Up Date--11/18/21 at 2:30 pm. GOAL MET  - keep 90 percent of scheduled  appointments -consider counseling or psychiatry -consider bumping up your self-care  -consider creating a stronger support network   Why is this important?             Beating depression and anxiety may take some time.            If you don't feel better right away, don't give up on your treatment plan.    Current barriers:   Chronic Mental Health needs related to PTSD. Patient requires Support, Education, Resources, Referrals, Advocacy, and Care Coordination, in order to meet Unmet Mental Health Needs. Patient will implement clinical interventions discussed today to decrease symptoms of PTSD and increase knowledge and/or ability of: coping skills. Mental Health Concerns and Social Isolation Patient lacks knowledge of available community counseling agencies and resources.  Clinical Goal(s): verbalize understanding of plan for management of PTSD and demonstrate a reduction in symptoms. Patient will connect with a provider for ongoing mental health treatment, increase coping skills, healthy habits, self-management skills, and stress reduction        Patient Goals/Self-Care Activities: Over the next 120 days Attend scheduled medical appointments Utilize healthy coping skills and supportive resources discussed Contact PCP with any questions or concerns Keep 90 percent of counseling appointments Call your insurance provider for more information about your Enhanced Benefits  Check out counseling resources provided  Begin personal counseling with LCSW, to reduce and manage symptoms of Depression and Stress, until well-established with mental health  provider Accept all calls from representative with [facility] in an effort to establish ongoing mental health counseling and supportive services. Incorporate into daily practice - relaxation techniques, deep breathing exercises, and mindfulness meditation strategies. Talk about feelings with friends, family members, spiritual advisor, etc. Contact LCSW  directly [phone]), if you have questions, need assistance, or if additional social work needs are identified between now and our next scheduled telephone outreach call. Call 988 for mental health hotline/crisis line if needed (24/7 available) Try techniques to reduce symptoms of anxiety/negative thinking (deep breathing, distraction, positive self talk, etc)  - develop a personal safety plan - develop a plan to deal with triggers like holidays, anniversaries - exercise at least 2 to 3 times per week - have a plan for how to handle bad days - journal feelings and what helps to feel better or worse - spend time or talk with others at least 2 to 3 times per week - watch for early signs of feeling worse - begin personal counseling - call and visit an old friend - check out volunteer opportunities - join a support group - laugh; watch a funny movie or comedian - learn and use visualization or guided imagery - perform a random act of kindness - practice relaxation or meditation daily - start or continue a personal journal - practice positive thinking and self-talk -continue with compliance of taking medication  -identify current effective and ineffective coping strategies.  -implement positive self-talk in care to increase self-esteem, confidence and feelings of control.  -consider alternative and complementary therapy approaches such as meditation, mindfulness or yoga.  -journaling, prayer, worship services, meditation or pastoral counseling.  -increase participation in pleasurable group activities such as hobbies, singing, sports or volunteering).  -consider the use of meditative movement therapy such as tai chi, yoga or qigong.  -start a regular daily exercise program based on tolerance, ability and patient choice to support positive thinking and activity    If you are experiencing a Mental Health or [place] or need someone to talk to, please call the Suicide and  Crisis Lifeline: 988   The following coping skill education was provided for stress relief and mental health management: "When your car dies or a deadline looms, how do you respond? Long-term, low-grade or acute stress takes a serious toll on your body and mind, so don't ignore feelings of constant tension. Stress is a natural part of life. However, too much stress can harm our health, especially if it continues every day. This is chronic stress and can put you at risk for heart problems like heart disease and depression. Understand what's happening inside your body and learn simple coping skills to combat the negative impacts of everyday stressors.  Types of Stress There are two types of stress: Emotional - types of emotional stress are relationship problems, pressure at work, financial worries, experiencing discrimination or having a major life change. Physical - Examples of physical stress include being sick having pain, not sleeping well, recovery from an injury or having an alcohol and drug use disorder. Fight or Flight Sudden or ongoing stress activates your nervous system and floods your bloodstream with adrenaline and cortisol, two hormones that raise blood pressure, increase heart rate and spike blood sugar. These changes pitch your body into a fight or flight response. That enabled our ancestors to outrun saber-toothed tigers, and it's helpful today for situations like dodging a car accident. But most modern chronic stressors, such as finances or a challenging relationship,  keep your body in that heightened state, which hurts your health. Effects of Too Much Stress If constantly under stress, most of [place] will eventually start to function less well.  Multiple studies link chronic stress to a higher risk of heart disease, stroke, depression, weight gain, memory loss and even premature death, so it's important to recognize the warning signals. Talk to your doctor about ways to manage stress if  you're experiencing any of these symptoms: Prolonged periods of poor sleep. Regular, severe headaches. Unexplained weight loss or gain. Feelings of isolation, withdrawal or worthlessness. Constant anger and irritability. Loss of interest in activities. Constant worrying or obsessive thinking. Excessive alcohol or drug use. Inability to concentrate.  10 Ways to Cope with Chronic Stress It's key to recognize stressful situations as they occur because it allows you to focus on managing how you react. We all need to know when to close our eyes and take a deep breath when we feel tension rising. Use these tips to prevent or reduce chronic stress. 1. Rebalance Work and Home All work and no play? If you're spending too much time at the office, intentionally put more dates in your calendar to enjoy time for fun, either alone or with others. 2. Get Regular Exercise Moving your body on a regular basis balances the nervous system and increases blood circulation, helping to flush out stress hormones. Even a daily 20-minute walk makes a difference. Any kind of exercise can lower stress and improve your mood ? just pick activities that you enjoy and make it a regular habit. 3. Eat Well and Limit Alcohol and Stimulants Alcohol, nicotine and caffeine may temporarily relieve stress but have negative health impacts and can make stress worse in the long run. Well-nourished bodies cope better, so start with a good breakfast, add more organic fruits and vegetables for a well-balanced diet, avoid processed foods and sugar, try herbal tea and drink more water. 4. Connect with Supportive People Talking face to face with another person releases hormones that reduce stress. Lean on those good listeners in your life. 5. [place] Time Do you enjoy gardening, reading, listening to music or some other creative pursuit? Engage in activities that bring you pleasure and joy; research shows that reduces stress by almost  half and lowers your heart rate, too. 6. Practice Meditation, Stress Reduction or Yoga Relaxation techniques activate a state of restfulness that counterbalances your body's fight-or-flight hormones. Even if this also means a 10-minute break in a long day: listen to music, read, go for a walk in nature, do a hobby, take a bath or spend time with a friend. Also consider doing a mindfulness exercise or try a daily deep breathing or imagery practice. Deep Breathing Slow, calm and deep breathing can help you relax. Try these steps to focus on your breathing and repeat as needed. Find a comfortable position and close your eyes. Exhale and drop your shoulders. Breathe in through your nose; fill your lungs and then your belly. Think of relaxing your body, quieting your mind and becoming calm and peaceful. Breathe out slowly through your nose, relaxing your belly. Think of releasing tension, pain, worries or distress. Repeat steps three and four until you feel relaxed. Imagery This involves using your mind to excite the senses -- sound, vision, smell, taste and feeling. This may help ease your stress. Begin by getting comfortable and then do some slow breathing. Imagine a place you love being at. It could be somewhere from your  childhood, somewhere you vacationed or just a place in your imagination. Feel how it is to be in the place you're imagining. Pay attention to the sounds, air, colors, and who is there with you. This is a place where you feel cared for and loved. All is well. You are safe. Take in all the smells, sounds, tastes and feelings. As you do, feel your body being nourished and healed. Feel the calm that surrounds you. Breathe in all the good. Breathe out any discomfort or tension. 7. Sleep Enough If you get less than seven to eight hours of sleep, your body won't tolerate stress as well as it could. If stress keeps you up at night, address the cause, and add extra meditation into your day  to make up for the lost z's. Try to get seven to nine hours of sleep each night. Make a regular bedtime schedule. Keep your room dark and cool. Try to avoid computers, TV, cell phones and tablets before bed. 8. Bond with Connections You Enjoy Go out for a coffee with a friend, chat with a neighbor, call a family member, visit with a clergy member, or even hang out with your pet. Clinical studies show that spending even a short time with a companion animal can cut anxiety levels almost in half. 9. Take a Vacation Getting away from it all can reset your stress tolerance by increasing your mental and emotional outlook, which makes you a happier, more productive person upon return. Leave your cellphone and laptop at home! 10. See a Counselor, Coach or Therapist If negative thoughts overwhelm your ability to make positive changes, it's time to seek professional help. Make an appointment today--your health and life are worth it."     24- Hour Availability:    [facility]  [address] [place], [place] Crisis [phone]   [facility] [employer] [phone]   [place]  [phone]    [place]  [phone] (after hours)   Therapeutic Alternative/Mobile Crisis   [phone]   [place] National Suicide Hotline  743-385-7407 Diamantina Monks) [place] 988   Call 911 or go to emergency room   [facility]  [phone]);  [place] and [employer]  [phone]); [place], [place], [place], [place], [place], [place], [place]          Patient Goals: Follow up goal

## 2021-12-02 ENCOUNTER — Ambulatory Visit (INDEPENDENT_AMBULATORY_CARE_PROVIDER_SITE_OTHER): Payer: Medicaid Other | Admitting: Mental Health

## 2021-12-02 ENCOUNTER — Other Ambulatory Visit: Payer: Self-pay

## 2021-12-02 DIAGNOSIS — F322 Major depressive disorder, single episode, severe without psychotic features: Secondary | ICD-10-CM | POA: Diagnosis not present

## 2021-12-02 NOTE — Patient Instructions (Signed)
Visit Information  [name]  - as a part of your Medicaid benefit, you are eligible for care management and care coordination services at no cost or copay. I was unable to reach you by phone today but would be happy to help you with your health related needs. Please feel free to call me @ [phone]).   A member of the Managed Medicaid care management team will reach out to you again over the next 30-60 days.   Mickel Fuchs, BSW, [place] Managed Medicaid Team  [phone]

## 2021-12-02 NOTE — Patient Outreach (Signed)
Care Coordination  12/02/2021  [name] 12/30/1985 [id number]    Medicaid Managed Care   Unsuccessful Outreach Note  12/02/2021 Name: Alicia Pearson MRN: [id number] DOB: 1985-07-13  Referred by: Lyndee Hensen, DO Reason for referral : High Risk Managed Medicaid (MM Social work telephone outreach )   An unsuccessful telephone outreach was attempted today. The patient was referred to the case management team for assistance with care management and care coordination.   Follow Up Plan: The care management team will reach out to the patient again over the next 30-60 days.   Mickel Fuchs, BSW, [place] Managed Medicaid Team  [phone]

## 2021-12-02 NOTE — Plan of Care (Signed)
  Problem: Depression CCP Problem  1 MDD severe Goal: LTG: [place] WILL SCORE LESS THAN 10 ON THE PATIENT HEALTH QUESTIONNAIRE (PHQ-9) Outcome: Initial Goal: STG: Anelia will decrease depressive sxs AEB development of x 3 effective coping skills with ability to reframe maladaptive thinking patterns 7/7 days within the next 6 months  Outcome: Initial Goal: STG: Kanae will increase management of stressors AEB engagement in sef-care activities daily within the next 6 months  Outcome: Initial

## 2021-12-02 NOTE — Progress Notes (Signed)
THERAPIST PROGRESS NOTE Virtual Visit via Video Note  I connected with Alicia Pearson on 12/02/21 at  9:00 AM EDT by a video enabled telemedicine application and verified that I am speaking with the correct person using two identifiers.  Location: Patient: in car at home address Provider: office    I discussed the limitations of evaluation and management by telemedicine and the availability of in person appointments. The patient expressed understanding and agreed to proceed.   I discussed the assessment and treatment plan with the patient. The patient was provided an opportunity to ask questions and all were answered. The patient agreed with the plan and demonstrated an understanding of the instructions.   The patient was advised to call back or seek an in-person evaluation if the symptoms worsen or if the condition fails to improve as anticipated.  I provided 25 minutes of non-face-to-face time during this encounter.   Alicia Pearson, [facility]   Session Time: 9:35am (25 minutes)   Participation Level: Active  Behavioral Response: CasualAlertEuthymic  Type of Therapy: Individual Therapy  Treatment Goals addressed: STG: Alicia Pearson will decrease depressive sxs AEB development of x 3 effective coping skills with ability to reframe maladaptive thinking patterns 7/7 days within the next 6 months   STG: Alicia Pearson will increase management of stressors AEB engagement in sef-care activities daily within the next 6 months   ProgressTowards Goals: Initial  Interventions: CBT and Supportive  Summary: Alicia Pearson is a 36 y.o. 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 Alicia Pearson presents for session alert and oriented. Speech clear and coherent at normal rate and tone. Dressed appropriate for weather. Thought process logical;linear. Mood and affect adequate. Sharni states to have been able to start work and feels this has been supportive of  increased level of functioning and mood management. 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 Shares thoughts of increase low mood during menstrual cycle and concern for PMDD but shares feelings of low mood outside of menstrual. Shares presence of poor focus due to racing thoughts. Agrees to work to practice mindful minute and tracking moods. Denies safety concerns.   Suicidal/Homicidal: Nowithout intent/plan  Therapist Response: Therapist engaged Karmin in [employer]. Completed check in and assessed for ability to hold confidential session and current location. Assessed for current level of functioning, sxs management; level of stressors. Provided safe space for Jermany to share thoughts and feelings regarding increase in mood secondary to ability to work. Provided support and encouragement; validated feelings. Active empathic listening. Explored areas in which she holds stressors and supported in development of plan to manage realistic stressors while giving self grace and engaging in adequate stress management. Encouraged [place] to engage in activities in which she finds rexation and increasing mindfulness and educated on the mindful minute. Reviewed session and provided follow up.   Plan: Return again in  x2  weeks.  Diagnosis: Severe major depression without psychotic features ([place])  Collaboration of Care: Other None  Patient/Guardian was advised Release of Information must be obtained prior to any record release in order to collaborate their care with an outside provider. Patient/Guardian was advised if they have not already done so to contact the registration department to sign all necessary forms in order for [place] to  release information regarding their care.   Consent: Patient/Guardian gives  verbal consent for treatment and assignment of benefits for services provided during this visit. Patient/Guardian expressed understanding and agreed to proceed.   Alicia Pearson, [facility] 12/02/2021

## 2021-12-16 ENCOUNTER — Telehealth (HOSPITAL_COMMUNITY): Payer: Self-pay | Admitting: Mental Health

## 2021-12-16 ENCOUNTER — Ambulatory Visit (HOSPITAL_COMMUNITY): Payer: Medicaid Other | Admitting: Mental Health

## 2021-12-16 NOTE — Telephone Encounter (Signed)
Therapist sent link for virtual therapy session. No response after x 10 minutes. Contacted Pt via telephone, no response, left message to reschedule.

## 2022-01-14 DIAGNOSIS — Z0000 Encounter for general adult medical examination without abnormal findings: Secondary | ICD-10-CM | POA: Diagnosis not present

## 2022-01-14 DIAGNOSIS — Z13220 Encounter for screening for lipoid disorders: Secondary | ICD-10-CM | POA: Diagnosis not present

## 2022-01-14 DIAGNOSIS — R5383 Other fatigue: Secondary | ICD-10-CM | POA: Diagnosis not present

## 2022-01-14 DIAGNOSIS — E559 Vitamin D deficiency, unspecified: Secondary | ICD-10-CM | POA: Diagnosis not present

## 2022-01-17 DIAGNOSIS — R030 Elevated blood-pressure reading, without diagnosis of hypertension: Secondary | ICD-10-CM | POA: Diagnosis not present

## 2022-01-17 DIAGNOSIS — J020 Streptococcal pharyngitis: Secondary | ICD-10-CM | POA: Diagnosis not present

## 2022-01-17 DIAGNOSIS — Z6833 Body mass index (BMI) 33.0-33.9, adult: Secondary | ICD-10-CM | POA: Diagnosis not present

## 2022-02-11 DIAGNOSIS — K029 Dental caries, unspecified: Secondary | ICD-10-CM | POA: Diagnosis not present

## 2022-02-27 ENCOUNTER — Ambulatory Visit (INDEPENDENT_AMBULATORY_CARE_PROVIDER_SITE_OTHER): Payer: Medicaid Other | Admitting: Podiatry

## 2022-02-27 DIAGNOSIS — Z91199 Patient's noncompliance with other medical treatment and regimen due to unspecified reason: Secondary | ICD-10-CM

## 2022-02-27 NOTE — Progress Notes (Signed)
Pt was a no show for apt, no charge 

## 2022-03-04 DIAGNOSIS — K029 Dental caries, unspecified: Secondary | ICD-10-CM | POA: Diagnosis not present

## 2022-03-05 ENCOUNTER — Ambulatory Visit (INDEPENDENT_AMBULATORY_CARE_PROVIDER_SITE_OTHER): Payer: Medicaid Other | Admitting: Podiatry

## 2022-03-05 ENCOUNTER — Ambulatory Visit (INDEPENDENT_AMBULATORY_CARE_PROVIDER_SITE_OTHER): Payer: Medicaid Other

## 2022-03-05 ENCOUNTER — Encounter: Payer: Self-pay | Admitting: Podiatry

## 2022-03-05 DIAGNOSIS — M722 Plantar fascial fibromatosis: Secondary | ICD-10-CM

## 2022-03-05 MED ORDER — TRIAMCINOLONE ACETONIDE 10 MG/ML IJ SUSP
10.0000 mg | Freq: Once | INTRAMUSCULAR | Status: AC
  Administered 2022-03-05: 10 mg

## 2022-03-05 MED ORDER — DICLOFENAC SODIUM 75 MG PO TBEC: 75.0000 mg | DELAYED_RELEASE_TABLET | Freq: Two times a day (BID) | ORAL | 2 refills | Status: DC

## 2022-03-05 NOTE — Patient Instructions (Signed)

## 2022-03-05 NOTE — Progress Notes (Signed)
Subjective:   Patient ID: Alicia Pearson, female   DOB: 37 y.o.   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  Patient does not smoke currently does occasionally smoke but only quarter pack per day as as she is able and she tries to be active   Review of Systems  All other systems reviewed and are negative.       Objective:  Physical Exam Vitals and nursing note reviewed.  Constitutional:      Appearance: She is well-developed.  Pulmonary:     Effort: Pulmonary effort is normal.  Musculoskeletal:        General: Normal range of motion.  Skin:    General: Skin is warm.  Neurological:     Mental Status: She is alert.     Neurovascular status intact muscle strength found to be adequate range of motion adequate moderate flatfoot deformity noted acute inflammation pain at the medial fascial band insertional point tendon calcaneus.  Good digital perfusion well-oriented x 3     Assessment:  Acute plantar fasciitis left with inflammation fluid buildup     Plan:  H&P reviewed condition and went ahead today did sterile prep injected the plantar fascia left 3 mg Kenalog 5 mg Xylocaine applied fascial brace left to lift up the arch and went ahead and placed on oral anti-inflammatory.  Reappoint as symptoms indicate  X-rays indicate small spur moderate depression of the arch no indication stress fracture

## 2022-03-12 DIAGNOSIS — K029 Dental caries, unspecified: Secondary | ICD-10-CM | POA: Diagnosis not present

## 2022-06-11 DIAGNOSIS — N914 Secondary oligomenorrhea: Secondary | ICD-10-CM | POA: Diagnosis not present

## 2022-06-12 DIAGNOSIS — R109 Unspecified abdominal pain: Secondary | ICD-10-CM | POA: Diagnosis not present

## 2022-06-15 DIAGNOSIS — H5213 Myopia, bilateral: Secondary | ICD-10-CM | POA: Diagnosis not present

## 2022-07-10 ENCOUNTER — Emergency Department (HOSPITAL_BASED_OUTPATIENT_CLINIC_OR_DEPARTMENT_OTHER)
Admission: EM | Admit: 2022-07-10 | Discharge: 2022-07-11 | Disposition: A | Discharge status: Home / Self Care | Payer: Medicaid Other | Attending: Emergency Medicine | Admitting: Emergency Medicine

## 2022-07-10 ENCOUNTER — Other Ambulatory Visit: Payer: Self-pay

## 2022-07-10 ENCOUNTER — Encounter (HOSPITAL_BASED_OUTPATIENT_CLINIC_OR_DEPARTMENT_OTHER): Payer: Self-pay | Admitting: Emergency Medicine

## 2022-07-10 DIAGNOSIS — R079 Chest pain, unspecified: Secondary | ICD-10-CM | POA: Diagnosis not present

## 2022-07-10 DIAGNOSIS — R45851 Suicidal ideations: Secondary | ICD-10-CM | POA: Insufficient documentation

## 2022-07-10 DIAGNOSIS — F332 Major depressive disorder, recurrent severe without psychotic features: Secondary | ICD-10-CM | POA: Diagnosis not present

## 2022-07-10 DIAGNOSIS — R002 Palpitations: Secondary | ICD-10-CM

## 2022-07-10 NOTE — ED Triage Notes (Signed)
Patient arrived via POV c/o palpitations x 1 hr pta. Patient states mid chest pain with it intermittently. Patient is AO x 4, VS WDL, normal gait.

## 2022-07-11 ENCOUNTER — Other Ambulatory Visit (HOSPITAL_BASED_OUTPATIENT_CLINIC_OR_DEPARTMENT_OTHER): Payer: Medicaid Other

## 2022-07-11 ENCOUNTER — Ambulatory Visit (HOSPITAL_COMMUNITY)
Admission: EM | Admit: 2022-07-11 | Discharge: 2022-07-11 | Disposition: A | Discharge status: Home / Self Care | Payer: Medicaid Other | Source: Home / Self Care

## 2022-07-11 ENCOUNTER — Emergency Department (HOSPITAL_BASED_OUTPATIENT_CLINIC_OR_DEPARTMENT_OTHER): Payer: Medicaid Other

## 2022-07-11 DIAGNOSIS — R002 Palpitations: Secondary | ICD-10-CM | POA: Diagnosis not present

## 2022-07-11 DIAGNOSIS — F332 Major depressive disorder, recurrent severe without psychotic features: Secondary | ICD-10-CM | POA: Insufficient documentation

## 2022-07-11 DIAGNOSIS — R45851 Suicidal ideations: Secondary | ICD-10-CM | POA: Insufficient documentation

## 2022-07-11 DIAGNOSIS — R079 Chest pain, unspecified: Secondary | ICD-10-CM | POA: Diagnosis not present

## 2022-07-11 DIAGNOSIS — F4321 Adjustment disorder with depressed mood: Secondary | ICD-10-CM

## 2022-07-11 LAB — PREGNANCY, URINE: Preg Test, Ur: NEGATIVE

## 2022-07-11 LAB — BASIC METABOLIC PANEL
Anion gap: 8 (ref 5–15)
BUN: 15 mg/dL (ref 6–20)
CO2: 25 mmol/L (ref 22–32)
Calcium: 8.5 mg/dL — ABNORMAL LOW (ref 8.9–10.3)
Chloride: 104 mmol/L (ref 98–111)
Creatinine, Ser: 0.89 mg/dL (ref 0.44–1.00)
GFR, Estimated: >60 mL/min (ref >60)
Glucose, Bld: 94 mg/dL (ref 70–99)
Potassium: 3.7 mmol/L (ref 3.5–5.1)
Sodium: 137 mmol/L (ref 135–145)

## 2022-07-11 LAB — CBC
HCT: 44.8 % (ref 36.0–46.0)
Hemoglobin: 15.1 g/dL — ABNORMAL HIGH (ref 12.0–15.0)
MCH: 32.3 pg (ref 26.0–34.0)
MCHC: 33.7 g/dL (ref 30.0–36.0)
MCV: 95.7 fL (ref 80.0–100.0)
Platelets: 274 10*3/uL (ref 150–400)
RBC: 4.68 MIL/uL (ref 3.87–5.11)
RDW: 12.8 % (ref 11.5–15.5)
WBC: 8.4 10*3/uL (ref 4.0–10.5)
nRBC: 0 % (ref 0.0–0.2)

## 2022-07-11 LAB — TROPONIN I (HIGH SENSITIVITY): Troponin I (High Sensitivity): <2 ng/L (ref <18)

## 2022-07-11 MED ORDER — SODIUM CHLORIDE 0.9 % IV BOLUS
500.0000 mL | Freq: Once | INTRAVENOUS | Status: AC
  Administered 2022-07-11: 500 mL via INTRAVENOUS

## 2022-07-11 NOTE — Progress Notes (Signed)
   07/11/22 1313  [facility] Triage Screening (Walk-ins at [facility] only)  How Did You Hear About [place]? Family/Friend  What Is the Reason for Your Visit/Call Today? Pt presents to [facility] voluntarily due to worsening depression symptoms and ongoing passive SI since December 2023. Pt states she feels like no one is there for her and she does not have a good support system. Pt reports ongoing stress from being the sole caregiver of her 4 younger children. Pt reports her mother passed away in Jun 11, 2022. Pt denies any plan or intent to harm herself currently. Pt reports past outpatient therapy services in October but did not continue services. Pt is interested in re-establishing outpatient services for therapy.Pt denies past suicide attempts. Pt denies past psychiatric hospitalizations. Pt denies HI and AVH.  How Long Has This Been Causing You Problems? 1 wk - 1 month  Have You Recently Had Any Thoughts About Hurting Yourself? Yes  How long ago did you have thoughts about hurting yourself? passive for several months  Are You Planning to Commit Suicide/Harm Yourself At This time? No  Have you Recently Had Thoughts About Hurting Someone Karolee Ohs? No  Are You Planning To Harm Someone At This Time? No  Are you currently experiencing any auditory, visual or other hallucinations? No  Have You Used Any Alcohol or Drugs in the Past 24 Hours? No  Do you have any current medical co-morbidities that require immediate attention? No  Clinician description of patient physical appearance/behavior: tearful, guarded  What Do You Feel Would Help You the Most Today? Treatment for Depression or other mood problem  If access to [facility] was not available, would you have sought care in the Emergency Department? No  Determination of Need Routine (7 days)  Options For Referral Outpatient Therapy;Medication Management
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
## 2022-07-11 NOTE — Discharge Instructions (Addendum)
Discharge recommendations:   Outpatient Follow up: Will make a referral for the PHP program. You may call to follow up with counseling services at the [facility]. Please review list of outpatient resources for psychiatry and counseling. Please follow up with your primary care provider for all medical related needs.   You are encouraged to follow up with [facility] for outpatient treatment.  Walk in/ Open Access Hours: Monday - Friday 8AM - 11AM (To see provider and therapist) Friday - 1PM - 4PM (To see therapist only)  [facility] [address] [place], [place] [phone]  Therapy: We recommend that patient participate in individual therapy to address mental health concerns.  Safety:   The following safety precautions should be taken:   No sharp objects. This includes scissors, razors, scrapers, and putty knives.   Chemicals should be removed and locked up.   Medications should be removed and locked up.   Weapons should be removed and locked up. This includes firearms, knives and instruments that can be used to cause injury.   The patient should abstain from use of illicit substances/drugs and abuse of any medications.  If symptoms worsen or do not continue to improve or if the patient becomes actively suicidal or homicidal then it is recommended that the patient return to the closest hospital emergency department, the [facility], or call 911 for further evaluation and treatment. National Suicide Prevention Lifeline 1-800-SUICIDE or 754-278-6237.  About 988 988 offers 24/7 access to trained crisis counselors who can help people experiencing mental health-related distress. People can call or text 988 or chat 988lifeline.org for themselves or if they are worried about a loved one who may need crisis support.
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
## 2022-08-20 DIAGNOSIS — E6609 Other obesity due to excess calories: Secondary | ICD-10-CM | POA: Diagnosis not present

## 2022-08-20 DIAGNOSIS — R300 Dysuria: Secondary | ICD-10-CM | POA: Diagnosis not present

## 2022-08-20 DIAGNOSIS — R399 Unspecified symptoms and signs involving the genitourinary system: Secondary | ICD-10-CM | POA: Diagnosis not present

## 2022-08-20 DIAGNOSIS — Z3200 Encounter for pregnancy test, result unknown: Secondary | ICD-10-CM | POA: Diagnosis not present

## 2022-08-20 DIAGNOSIS — Z6833 Body mass index (BMI) 33.0-33.9, adult: Secondary | ICD-10-CM | POA: Diagnosis not present

## 2022-08-20 DIAGNOSIS — R3129 Other microscopic hematuria: Secondary | ICD-10-CM | POA: Diagnosis not present

## 2022-08-21 DIAGNOSIS — R3129 Other microscopic hematuria: Secondary | ICD-10-CM | POA: Diagnosis not present

## 2022-08-30 DIAGNOSIS — Z6833 Body mass index (BMI) 33.0-33.9, adult: Secondary | ICD-10-CM | POA: Diagnosis not present

## 2022-08-30 DIAGNOSIS — Z0130 Encounter for examination of blood pressure without abnormal findings: Secondary | ICD-10-CM | POA: Diagnosis not present

## 2022-08-30 DIAGNOSIS — R300 Dysuria: Secondary | ICD-10-CM | POA: Diagnosis not present

## 2022-09-25 DIAGNOSIS — R030 Elevated blood-pressure reading, without diagnosis of hypertension: Secondary | ICD-10-CM | POA: Diagnosis not present

## 2022-09-25 DIAGNOSIS — Z789 Other specified health status: Secondary | ICD-10-CM | POA: Diagnosis not present

## 2022-09-25 DIAGNOSIS — Z0130 Encounter for examination of blood pressure without abnormal findings: Secondary | ICD-10-CM | POA: Diagnosis not present

## 2022-09-25 DIAGNOSIS — N939 Abnormal uterine and vaginal bleeding, unspecified: Secondary | ICD-10-CM | POA: Diagnosis not present

## 2022-09-25 DIAGNOSIS — N941 Unspecified dyspareunia: Secondary | ICD-10-CM | POA: Diagnosis not present

## 2022-09-25 DIAGNOSIS — Z6833 Body mass index (BMI) 33.0-33.9, adult: Secondary | ICD-10-CM | POA: Diagnosis not present

## 2022-11-06 DIAGNOSIS — R300 Dysuria: Secondary | ICD-10-CM | POA: Diagnosis not present

## 2022-11-06 DIAGNOSIS — N898 Other specified noninflammatory disorders of vagina: Secondary | ICD-10-CM | POA: Diagnosis not present

## 2022-11-06 DIAGNOSIS — R5383 Other fatigue: Secondary | ICD-10-CM | POA: Diagnosis not present

## 2022-11-06 DIAGNOSIS — N390 Urinary tract infection, site not specified: Secondary | ICD-10-CM | POA: Diagnosis not present

## 2022-11-06 DIAGNOSIS — B3731 Acute candidiasis of vulva and vagina: Secondary | ICD-10-CM | POA: Diagnosis not present

## 2022-11-06 DIAGNOSIS — Z7251 High risk heterosexual behavior: Secondary | ICD-10-CM | POA: Diagnosis not present

## 2022-11-06 DIAGNOSIS — Z3200 Encounter for pregnancy test, result unknown: Secondary | ICD-10-CM | POA: Diagnosis not present

## 2022-11-13 DIAGNOSIS — R5383 Other fatigue: Secondary | ICD-10-CM | POA: Diagnosis not present

## 2022-11-13 DIAGNOSIS — M722 Plantar fascial fibromatosis: Secondary | ICD-10-CM | POA: Diagnosis not present

## 2022-11-13 DIAGNOSIS — Z131 Encounter for screening for diabetes mellitus: Secondary | ICD-10-CM | POA: Diagnosis not present

## 2022-11-13 DIAGNOSIS — Z6833 Body mass index (BMI) 33.0-33.9, adult: Secondary | ICD-10-CM | POA: Diagnosis not present

## 2022-11-13 DIAGNOSIS — R0602 Shortness of breath: Secondary | ICD-10-CM | POA: Diagnosis not present

## 2022-11-13 DIAGNOSIS — E6609 Other obesity due to excess calories: Secondary | ICD-10-CM | POA: Diagnosis not present

## 2022-12-23 ENCOUNTER — Emergency Department (HOSPITAL_BASED_OUTPATIENT_CLINIC_OR_DEPARTMENT_OTHER)
Admission: EM | Admit: 2022-12-23 | Discharge: 2022-12-23 | Disposition: A | Discharge status: Home / Self Care | Payer: Medicaid Other | Attending: Emergency Medicine | Admitting: Emergency Medicine

## 2022-12-23 ENCOUNTER — Encounter (HOSPITAL_BASED_OUTPATIENT_CLINIC_OR_DEPARTMENT_OTHER): Payer: Self-pay | Admitting: Urology

## 2022-12-23 ENCOUNTER — Other Ambulatory Visit: Payer: Self-pay

## 2022-12-23 DIAGNOSIS — F410 Panic disorder [episodic paroxysmal anxiety] without agoraphobia: Secondary | ICD-10-CM | POA: Diagnosis not present

## 2022-12-23 DIAGNOSIS — F419 Anxiety disorder, unspecified: Secondary | ICD-10-CM | POA: Diagnosis present

## 2022-12-23 DIAGNOSIS — R21 Rash and other nonspecific skin eruption: Secondary | ICD-10-CM

## 2022-12-23 LAB — PREGNANCY, URINE: Preg Test, Ur: NEGATIVE

## 2022-12-23 MED ORDER — HYDROXYZINE HCL 25 MG PO TABS: 25.0000 mg | ORAL_TABLET | Freq: Four times a day (QID) | ORAL | 0 refills | Status: DC

## 2022-12-23 MED ORDER — TRIAMCINOLONE ACETONIDE 0.1 % EX CREA: 1.0000 | TOPICAL_CREAM | Freq: Two times a day (BID) | CUTANEOUS | 0 refills | Status: DC

## 2022-12-23 NOTE — ED Provider Notes (Signed)
[place] [facility] Provider Note   CSN: [id number] Arrival date & time: 12/23/22  1940     History Chief Complaint  Patient presents with   Anxiety    HPI Alicia Pearson is a 37 y.o. female presenting for rash and anxiety. Reports stress causing physical rashes, difficult to manage due to wearing PPE all night at work. Lesions first appeared on flexor surfaces and have been symmetrical. Rashes initially appear in one spot, then develop in the exact same spot on the opposite side of the body. Affected areas include axillae and groin. Utilizes triamcinolone, a medication prescribed for family members with similar issues, for flare-ups. Finds it challenging to scratch or manage the rashes while wearing PPE during work shifts. Experiences panic episodes at work, feeling trapped in PPE.   Patient's recorded medical, surgical, social, medication list and allergies were reviewed in the Snapshot window as part of the initial history.   Review of Systems   Review of Systems  Constitutional:  Negative for chills and fever.  HENT:  Negative for ear pain and sore throat.   Eyes:  Negative for pain and visual disturbance.  Respiratory:  Negative for cough and shortness of breath.   Cardiovascular:  Negative for chest pain and palpitations.  Gastrointestinal:  Negative for abdominal pain and vomiting.  Genitourinary:  Negative for dysuria and hematuria.  Musculoskeletal:  Negative for arthralgias and back pain.  Skin:  Positive for rash. Negative for color change.  Neurological:  Negative for seizures and syncope.  Psychiatric/Behavioral:  Positive for agitation. The patient is nervous/anxious.   All other systems reviewed and are negative.   Physical Exam Updated Vital Signs BP (!) 136/92 (BP Location: Left Arm)   Pulse 85   Temp 98.6 F (37 C) (Oral)   Resp 16   Ht 5\' 4"  (1.626 m)   Wt 88.9 kg   SpO2 100%   BMI 33.64 kg/m  Physical  Exam Constitutional:      General: She is not in acute distress.    Appearance: She is not ill-appearing or toxic-appearing.  HENT:     Head: Normocephalic and atraumatic.  Eyes:     Extraocular Movements: Extraocular movements intact.     Pupils: Pupils are equal, round, and reactive to light.  Cardiovascular:     Rate and Rhythm: Normal rate.  Pulmonary:     Effort: No respiratory distress.  Abdominal:     General: Abdomen is flat.  Musculoskeletal:        General: No swelling, deformity or signs of injury.     Cervical back: Normal range of motion. No rigidity.  Skin:    General: Skin is warm and dry.     Findings: Rash (Maculopapular rash on flexural surfaces of the bilateral upper extremities extending across to the breast.  Patient endorses same rash in her axilla and groin.) present.  Neurological:     General: No focal deficit present.     Mental Status: She is alert and oriented to person, place, and time.  Psychiatric:        Mood and Affect: Mood normal.      ED Course/ Medical Decision Making/ A&P    Procedures Procedures   Medications Ordered in ED Medications - No data to display  Medical Decision Making:   - Differential Diagnosis: Eczema, contact dermatitis. - Most Likely Diagnosis: Eczema exacerbated by stress and PPE use/time of year. - Plan:  Triamcinolone cream prescribed,  to be applied twice daily. Recommendation for over-the-counter lotion to moisturize affected areas. Hydroxyzine (Atarax) prescribed for panic episodes and to aid in rash management due to its antihistamine properties. Encouraged follow-up with psychiatric resources for long-term anxiety management. Education on application timing and method for topical treatments.  Disposition:  I have considered need for hospitalization, however, considering all of the above, I believe this patient is stable for discharge at this time.  Patient/family educated about specific return  precautions for given chief complaint and symptoms.  Patient/family educated about follow-up with PCP.     Patient/family expressed understanding of return precautions and need for follow-up. Patient spoken to regarding all imaging and laboratory results and appropriate follow up for these results. All education provided in verbal form with additional information in written form. Time was allowed for answering of patient questions. Patient discharged.    Emergency Department Medication Summary:   Medications - No data to display      Clinical Impression:  1. Panic attack   2. Rash      Discharge   Final Clinical Impression(s) / ED Diagnoses Final diagnoses:  Panic attack  Rash    Rx / DC Orders ED Discharge Orders          Ordered    triamcinolone cream (KENALOG) 0.1 %  2 times daily        12/23/22 2058    hydrOXYzine (ATARAX) 25 MG tablet  Every 6 hours,   Status:  Discontinued        12/23/22 2058    hydrOXYzine (ATARAX) 25 MG tablet  Every 6 hours        12/23/22 2059              Glyn Ade, MD 12/23/22 2102

## 2022-12-23 NOTE — ED Notes (Signed)
Pt expressed desire for pregnancy test, awaiting for EDP designation for notification

## 2022-12-23 NOTE — ED Triage Notes (Signed)
Pt states been feeling anxious and stressed after working  States has been feeling like is having panic attacks on and off for a couple months Lost mom in April States had new rash to chest and arms x 1 month ago after bad new that caused stress  NAD at this time

## 2022-12-23 NOTE — ED Notes (Signed)
Pt concerned for possible pregnancy. Preg test ordered.

## 2023-11-23 DIAGNOSIS — Z13220 Encounter for screening for lipoid disorders: Secondary | ICD-10-CM | POA: Diagnosis not present

## 2023-11-23 DIAGNOSIS — Z131 Encounter for screening for diabetes mellitus: Secondary | ICD-10-CM | POA: Diagnosis not present

## 2023-11-23 DIAGNOSIS — R5383 Other fatigue: Secondary | ICD-10-CM | POA: Diagnosis not present

## 2023-11-23 DIAGNOSIS — Z0000 Encounter for general adult medical examination without abnormal findings: Secondary | ICD-10-CM | POA: Diagnosis not present

## 2023-11-23 DIAGNOSIS — E559 Vitamin D deficiency, unspecified: Secondary | ICD-10-CM | POA: Diagnosis not present

## 2024-01-04 DIAGNOSIS — A5901 Trichomonal vulvovaginitis: Secondary | ICD-10-CM

## 2024-01-04 HISTORY — DX: Trichomonal vulvovaginitis: A59.01

## 2024-02-12 ENCOUNTER — Encounter

## 2024-02-17 ENCOUNTER — Encounter

## 2024-03-02 ENCOUNTER — Encounter: Payer: Self-pay | Admitting: *Deleted

## 2024-03-02 ENCOUNTER — Other Ambulatory Visit (HOSPITAL_COMMUNITY)
Admission: RE | Admit: 2024-03-02 | Discharge: 2024-03-02 | Disposition: A | Discharge status: Home / Self Care | Payer: Self-pay | Source: Ambulatory Visit | Attending: Obstetrics and Gynecology | Admitting: Obstetrics and Gynecology

## 2024-03-02 ENCOUNTER — Other Ambulatory Visit: Payer: Self-pay

## 2024-03-02 ENCOUNTER — Ambulatory Visit (INDEPENDENT_AMBULATORY_CARE_PROVIDER_SITE_OTHER): Payer: Self-pay | Admitting: *Deleted

## 2024-03-02 VITALS — BP 136/82 | HR 112 | Wt 204.0 lb

## 2024-03-02 DIAGNOSIS — O3680X0 Pregnancy with inconclusive fetal viability, not applicable or unspecified: Secondary | ICD-10-CM

## 2024-03-02 DIAGNOSIS — O0991 Supervision of high risk pregnancy, unspecified, first trimester: Secondary | ICD-10-CM

## 2024-03-02 DIAGNOSIS — O09521 Supervision of elderly multigravida, first trimester: Secondary | ICD-10-CM

## 2024-03-02 DIAGNOSIS — Z3A11 11 weeks gestation of pregnancy: Secondary | ICD-10-CM

## 2024-03-02 DIAGNOSIS — O0990 Supervision of high risk pregnancy, unspecified, unspecified trimester: Secondary | ICD-10-CM | POA: Insufficient documentation

## 2024-03-02 DIAGNOSIS — O34219 Maternal care for unspecified type scar from previous cesarean delivery: Secondary | ICD-10-CM | POA: Insufficient documentation

## 2024-03-02 DIAGNOSIS — O09899 Supervision of other high risk pregnancies, unspecified trimester: Secondary | ICD-10-CM | POA: Insufficient documentation

## 2024-03-02 DIAGNOSIS — O09529 Supervision of elderly multigravida, unspecified trimester: Secondary | ICD-10-CM | POA: Insufficient documentation
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
## 2024-03-03 LAB — CERVICOVAGINAL ANCILLARY ONLY
Chlamydia: NEGATIVE
Comment: NEGATIVE
Comment: NEGATIVE
Comment: NORMAL
Neisseria Gonorrhea: NEGATIVE
Trichomonas: NEGATIVE

## 2024-03-04 ENCOUNTER — Ambulatory Visit: Payer: Self-pay | Admitting: Obstetrics & Gynecology

## 2024-03-04 DIAGNOSIS — O09529 Supervision of elderly multigravida, unspecified trimester: Secondary | ICD-10-CM

## 2024-03-04 DIAGNOSIS — O0990 Supervision of high risk pregnancy, unspecified, unspecified trimester: Secondary | ICD-10-CM

## 2024-03-04 LAB — CBC/D/PLT+RPR+RH+ABO+RUBIGG...
Basophils Absolute: 0 10*3/uL (ref 0.0–0.2)
Basos: 0 %
EOS (ABSOLUTE): 0 10*3/uL (ref 0.0–0.4)
Eos: 0 %
HCV Ab: NONREACTIVE
HIV Screen 4th Generation wRfx: NONREACTIVE
Hematocrit: 45.9 % (ref 34.0–46.6)
Hemoglobin: 15.5 g/dL (ref 11.1–15.9)
Hepatitis B Surface Ag: NEGATIVE
Immature Grans (Abs): 0 10*3/uL (ref 0.0–0.1)
Immature Granulocytes: 0 %
Lymphocytes Absolute: 1.5 10*3/uL (ref 0.7–3.1)
Lymphs: 16 %
MCH: 32.8 pg (ref 26.6–33.0)
MCHC: 33.8 g/dL (ref 31.5–35.7)
MCV: 97 fL (ref 79–97)
Monocytes Absolute: 0.6 10*3/uL (ref 0.1–0.9)
Monocytes: 6 %
Neutrophils Absolute: 7.4 10*3/uL — ABNORMAL HIGH (ref 1.4–7.0)
Neutrophils: 78 %
Platelets: 307 10*3/uL (ref 150–450)
RBC: 4.72 x10E6/uL (ref 3.77–5.28)
RDW: 12.3 % (ref 11.7–15.4)
RPR Ser Ql: NONREACTIVE
Rh Factor: POSITIVE
Rubella Antibodies, IGG: 3.16 {index}
WBC: 9.7 10*3/uL (ref 3.4–10.8)

## 2024-03-04 LAB — AB SCR+ANTIBODY ID: Antibody Screen: POSITIVE — AB

## 2024-03-04 LAB — URINE CULTURE, OB REFLEX

## 2024-03-04 LAB — TSH RFX ON ABNORMAL TO FREE T4: TSH: 0.531 u[IU]/mL (ref 0.450–4.500)

## 2024-03-04 LAB — HEMOGLOBIN A1C
Est. average glucose Bld gHb Est-mCnc: 97 mg/dL
Hgb A1c MFr Bld: 5 % (ref 4.8–5.6)

## 2024-03-04 LAB — CULTURE, OB URINE

## 2024-03-04 LAB — HCV INTERPRETATION

## 2024-03-08 ENCOUNTER — Encounter: Payer: Self-pay | Admitting: Obstetrics & Gynecology

## 2024-03-08 DIAGNOSIS — O361990 Maternal care for other isoimmunization, unspecified trimester, not applicable or unspecified: Secondary | ICD-10-CM | POA: Insufficient documentation

## 2024-03-09 ENCOUNTER — Encounter: Payer: Self-pay | Admitting: Obstetrics & Gynecology

## 2024-03-09 LAB — PANORAMA PRENATAL TEST FULL PANEL:PANORAMA TEST PLUS 5 ADDITIONAL MICRODELETIONS: FETAL FRACTION: 5

## 2024-03-22 ENCOUNTER — Encounter: Admitting: Obstetrics & Gynecology

## 2024-04-01 ENCOUNTER — Encounter: Payer: Self-pay | Admitting: Obstetrics and Gynecology

## 2024-05-11 ENCOUNTER — Other Ambulatory Visit: Payer: Self-pay

## 2024-05-11 ENCOUNTER — Ambulatory Visit: Payer: Self-pay
